# Patient Record
Sex: Female | Born: 1995
Health system: Southern US, Community
[De-identification: ages and names within clinical notes are randomized; demographics above are authoritative.]

## PROBLEM LIST (undated history)

## (undated) DIAGNOSIS — Q210 Ventricular septal defect: Secondary | ICD-10-CM

## (undated) DIAGNOSIS — G43909 Migraine, unspecified, not intractable, without status migrainosus: Secondary | ICD-10-CM

## (undated) DIAGNOSIS — E109 Type 1 diabetes mellitus without complications: Secondary | ICD-10-CM

## (undated) DIAGNOSIS — E11649 Type 2 diabetes mellitus with hypoglycemia without coma: Secondary | ICD-10-CM

## (undated) DIAGNOSIS — R6250 Unspecified lack of expected normal physiological development in childhood: Secondary | ICD-10-CM

## (undated) DIAGNOSIS — E049 Nontoxic goiter, unspecified: Secondary | ICD-10-CM

## (undated) DIAGNOSIS — T7840XA Allergy, unspecified, initial encounter: Secondary | ICD-10-CM

## (undated) HISTORY — PX: FRENULECTOMY, LINGUAL: SHX1681

## (undated) HISTORY — DX: Ventricular septal defect: Q21.0

## (undated) HISTORY — DX: Migraine, unspecified, not intractable, without status migrainosus: G43.909

## (undated) HISTORY — DX: Nontoxic goiter, unspecified: E04.9

## (undated) HISTORY — DX: Unspecified lack of expected normal physiological development in childhood: R62.50

## (undated) HISTORY — PX: OTHER SURGICAL HISTORY: SHX169

## (undated) HISTORY — DX: Allergy, unspecified, initial encounter: T78.40XA

## (undated) HISTORY — DX: Type 2 diabetes mellitus with hypoglycemia without coma: E11.649

## (undated) HISTORY — DX: Type 1 diabetes mellitus without complications: E10.9

---
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
## 2015-01-10 ENCOUNTER — Encounter: Payer: Self-pay | Admitting: *Deleted

## 2015-01-10 NOTE — Progress Notes (Signed)
On 12/17/14 pt was given 1 sample humalog pens, lot# [id number], exp 10/17 and 1 pen lot# [id number], exp 12/17

## 2015-02-10 ENCOUNTER — Encounter: Payer: Self-pay | Admitting: "Endocrinology

## 2015-02-10 ENCOUNTER — Ambulatory Visit: Payer: 59 | Admitting: Pediatric Endocrinology

## 2015-02-10 ENCOUNTER — Ambulatory Visit (INDEPENDENT_AMBULATORY_CARE_PROVIDER_SITE_OTHER): Payer: 59 | Admitting: "Endocrinology

## 2015-02-10 VITALS — BP 119/78 | HR 106 | Wt 134.6 lb

## 2015-02-10 DIAGNOSIS — E1065 Type 1 diabetes mellitus with hyperglycemia: Secondary | ICD-10-CM

## 2015-02-10 DIAGNOSIS — I471 Supraventricular tachycardia: Secondary | ICD-10-CM

## 2015-02-10 DIAGNOSIS — I4711 Inappropriate sinus tachycardia, so stated: Secondary | ICD-10-CM

## 2015-02-10 DIAGNOSIS — E10649 Type 1 diabetes mellitus with hypoglycemia without coma: Secondary | ICD-10-CM

## 2015-02-10 DIAGNOSIS — E1043 Type 1 diabetes mellitus with diabetic autonomic (poly)neuropathy: Secondary | ICD-10-CM

## 2015-02-10 DIAGNOSIS — R635 Abnormal weight gain: Secondary | ICD-10-CM

## 2015-02-10 DIAGNOSIS — IMO0002 Reserved for concepts with insufficient information to code with codable children: Secondary | ICD-10-CM

## 2015-02-10 DIAGNOSIS — R000 Tachycardia, unspecified: Secondary | ICD-10-CM

## 2015-02-10 DIAGNOSIS — E049 Nontoxic goiter, unspecified: Secondary | ICD-10-CM

## 2015-02-10 LAB — GLUCOSE, POCT (MANUAL RESULT ENTRY): POC Glucose: 334 mg/dl — AB (ref 70–99)

## 2015-02-10 LAB — POCT GLYCOSYLATED HEMOGLOBIN (HGB A1C): HEMOGLOBIN A1C: 8.5

## 2015-02-10 NOTE — Progress Notes (Signed)
Subjective:  Patient Name: Paula Miller Date of Birth: 1996/10/12  MRN: [id number]  Immaculate Crutcher  presents to the office today for follow-up evaluation and management of her type 1 diabetes on insulin pump, hypoglycemia, hypoglycemic unawareness, autonomic neuropathy, inappropriate sinus tachycardia, and goiter  HISTORY OF PRESENT ILLNESS:   Paula Miller is a 19 y.o. Caucasian young lady.  [name] was unaccompanied.  1. Paula Miller was diagnosed with new onset type 1 diabetes mellitus, dehydration, and ketonuria on 06/09/2005. She was initially treated with a multiple daily injection regimen of Lantus and Novolog insulins.  We converted her to a Medtronic insulin pump in November of 2007.  Although the patient does have a palpable goiter, she has remained euthyroid. Her CMPs, lipid panels, and urinary microalbumin to creatinine ratios have always been normal.   2. The patient's last [facility] visit was on 12/17/14. In the interim, she has been generally healthy. She feels less embarrassed to take care of her diabetes in front of close friends. She is also more willing to check BGs and take her insulin boluses around people who are not close friends. She has her own private room now, but shares a bath and common room with a roommate. She is still somewhat tired, but is better. She is still concerned about weight gain and has been trying to lose weight. She denies intentionally missing insulin for weight loss.   3. Pertinent Review of Systems:  Constitutional: The patient feels "pretty good". She is generally healthy.  Eyes: Vision is good with her new glasses. Her most recent eye exam was December 2015. There were no signs of diabetic eye disease. There are no other recognized eye problems.  Neck: The patient has no complaints of anterior neck swelling, soreness, tenderness, pressure, discomfort, or difficulty swallowing.   Heart: Heart rate increases with exercise or other physical activity. The patient  has no complaints of palpitations, irregular heart beats, chest pain, or chest pressure.   Gastrointestinal: Bowel movents seem normal. The patient has no complaints of excessive hunger, acid reflux, upset stomach, stomach aches or pains, diarrhea, or constipation.  Legs: Muscle mass and strength seem normal. There are no complaints of numbness, tingling, burning, or pain. No edema is noted.  Feet: There are no obvious foot problems. There are no complaints of numbness, tingling, burning, or pain. No edema is noted. Neurologic: There are no recognized problems with muscle movement and strength, sensation, or coordination. GYN: MP 3 weeks ago. Periods are regular Hypoglycemia: rare- lowest 52  Diabetes ID: She owns ID, but is not wearing it.    4. BG printout: She changes sites every 3-5 days. She checks BGs an average of 4.8 times per day, compared to 3.9 times daily at her last visit. She occasionally misses a bedtime BG check. Average BG was 204, compared with 233 at last visit. She boluses from 0-5 times per day. She can sometimes go for up to 48 hours without bol using. Many of her lower BGs that occur in the afternoons are due to exercising. She has had 18 BGs < 80, to include a 50 and a 51. She has had 11 BGs > 400.    PAST MEDICAL, FAMILY, AND SOCIAL HISTORY  Past Medical History  Diagnosis Date  . Type 1 diabetes mellitus in patient age 81-19 years with HbA1C goal below 7.5   . Hypoglycemia associated with diabetes   . Goiter   . Physical growth delay     Family History  Problem Relation  Age of Onset  . Diabetes Mother   . Thyroid disease Maternal Grandmother      Current outpatient prescriptions:  .  APIDRA 100 UNIT/ML injection, Inject subcutaneously - use with insulin pump, Disp: 100 mL, Rfl: 3 .  doxycycline (ADOXA) 100 MG tablet, Take 100 mg by mouth 2 (two) times daily., Disp: , Rfl:  .  glucagon 1 MG injection, Use for Severe Hypoglycemia . Inject 42m intramuscularly if  unresponsive, unable to swallow, unconscious and/or has seizure, Disp: 2 kit, Rfl: 6 .  glucose blood (BAYER CONTOUR NEXT TEST) test strip, Checks blood sugar 10x daily, Disp: 900 each, Rfl: 4 .  Multiple Vitamin (MULTIVITAMIN) tablet, Take 1 tablet by mouth daily.  , Disp: , Rfl:  .  insulin aspart (NOVOLOG FLEXPEN) 100 UNIT/ML FlexPen, Use up to 50 units daily (Patient not taking: Reported on 02/10/2015), Disp: 5 pen, Rfl: 6 .  insulin glargine (LANTUS) 100 UNIT/ML injection, Use per protocol up to 50 units., Disp: 15 mL, Rfl: 3 .  Insulin Pen Needle 31G X 5 MM MISC, Check Blood glucose 10x day (Patient not taking: Reported on 02/10/2015), Disp: 900 each, Rfl: 3  Allergies as of 02/10/2015  . (No Known Allergies)     reports that she has never smoked. She has never used smokeless tobacco. She reports that she does not drink alcohol or use illicit drugs. Pediatric History  Patient Guardian Status  . Father:  TJazmene, Racz  Other Topics Concern  . 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 She plans to major in psychology and premed Activities: She tried to start running, but developed some knee pains. She walks a lot around the campus. Denies any alcohol use.  Primary Care Provider: DMaurine Cane MD  REVIEW OF SYSTEMS: There are no other significant problems involving Charlott's other body systems.   Objective:  Vital Signs:  BP 119/78 mmHg  Pulse 106  Wt 134 lb 9.6 oz (61.054 kg)    Ht Readings from Last 3 Encounters:  09/03/14 5' 3.94" (1.624 m) (46 %*, Z = -0.11)  05/02/14 5' 3.94" (1.624 m) (46 %*, Z = -0.10)  11/12/13 5' 3.78" (1.62 m) (44 %*, Z = -0.15)   * Growth percentiles are based on CDC 2-20 Years data.   Wt Readings from Last 3 Encounters:  02/10/15 134 lb 9.6 oz (61.054 kg) (67 %*, Z = 0.43)  12/17/14 130 lb 12.8 oz (59.33 kg) (62 %*, Z = 0.29)  09/03/14 132 lb (59.875 kg) (65 %*, Z =  0.38)   * Growth percentiles are based on CDC 2-20 Years data.   HC Readings from Last 3 Encounters:  [facility]  There is no height on file to calculate BSA. No height on file for this encounter. 67%ile (Z=0.43) based on CDC 2-20 Years weight-for-age data using vitals from 02/10/2015.  PHYSICAL EXAM: Constitutional: The patient appears healthy. She has gained about 3-3/4 pounds since her last visit. The patient's height and weight are normal for age.  Head: The head is normocephalic. Face: The face appears normal. There are no obvious dysmorphic features. Eyes: The eyes appear to be normally formed and spaced. Gaze is conjugate. There is no obvious arcus or proptosis. Moisture appears normal. Mouth: The oropharynx appears normal. Her geographic tongue has resolved. Oral moisture is normal. Neck: The neck appears to be visibly normal. The thyroid  gland is smaller at about 22-23 grams in size. Both lobes are mildly enlarged. The consistency is normal. The thyroid gland is not tender to palpation. Lungs: The lungs are clear to auscultation. Air movement is good. Heart: Heart rate and rhythm are regular. Heart sounds S1 and S2 are normal. I did not appreciate any pathologic cardiac murmurs. Abdomen: The abdomen is normal in size for the patient's age. Bowel sounds are normal. There is no obvious hepatomegaly, splenomegaly, or other mass effect.  Arms: Muscle size and bulk are normal for age. Hands: There is no obvious tremor. Phalangeal and metacarpophalangeal joints are normal. Palmar muscles are normal for age. Palmar skin is normal. Palmar moisture is also normal. Legs: Muscles appear normal for age. No edema is present. Feet: Feet are normally formed. Dorsalis pedal pulses are normal 1+ bilaterally. Neurologic: Strength is normal for age in both the upper and lower extremities. Muscle tone is normal. Sensation to touch is normal in both the legs and feet. She is extremely  ticklish.   LAB DATA:   Results for orders placed or performed in visit on 02/10/15 (from the past 504 hour(s))  POCT Glucose (CBG)   Collection Time: 02/10/15  2:08 PM  Result Value Ref Range   POC Glucose 334 (A) 70 - 99 mg/dl  POCT HgB A1C   Collection Time: 02/10/15  2:12 PM  Result Value Ref Range   Hemoglobin A1C 8.5    Office Visit on 12/17/2014  Component Date Value Ref Range Status  . POC Glucose 12/17/2014 355* 70 - 99 mg/dl Final   12:45 PB sandwich, chips and water   Labs 11/13/14: HbA1c 9.1%; CMP normal except for a glucose of 218; cholesterol 150, triglycerides 109, HDL 72, LDL 56; urinary microalbumin/creatinine ratio 2.7; TSH 1.634, free T4 1.12, free T3 4.2   Assessment and Plan:   ASSESSMENT:  1. Type 1 diabetes: She has often been leaving sites in too long after they have already started to go bad. She has also been missing many boluses.  2. Hypoglycemia: Her low BGs have become more frequent, in part due to not always checking BGs before bolusing, in part due to trying to consume fewer carbs, and in apart due to increasing exercise.  3. Goiter: Her thyroid gland is actually smaller today. The waxing and waning of thyroid gland size is c/w evolving Hashimoto's thyroiditis. She was euthyroid again last December.  4. Weight increased: She is about 12% above her Ideal Body Weight.  5. Autonomic neuropathy and inappropriate sinus tachycardia: These problems will reverse with better BG control.  PLAN:  1. Diagnostic: HbA1c as above. Annual labs as above 2. Therapeutic: No changes to pump settings. She needs to change sites more frequently and bolus more consistently. If she exercises she needs to set a lower temporary basal rate..   Basal rates:  MN 0.60 4 0.60 630 1.10 10 1.85 2 PM 1.75 7 PM 1.20 9 PM 0.90  Bolus settings:   Carb   ISF   Targets MN 40  100   150 6   5    50   110 11 12 5  pm 18  100 11 PM      150   3. Patient education: Reviewed  pump download. Reviewed the Pump hyperglycemia protocol. Discussed need to change sites regularly, check BGs regularly, and to bolus regularly.  4. Follow-up: 3 months  Level of Service: This visit lasted in excess of 40 minutes. More than 50%  of the visit was devoted to counseling.    Sherrlyn Hock, MD

## 2015-02-10 NOTE — Patient Instructions (Signed)
Follow up visit in 3 months. 

## 2015-05-21 ENCOUNTER — Encounter: Payer: Self-pay | Admitting: Pediatric Endocrinology

## 2015-05-21 ENCOUNTER — Ambulatory Visit (INDEPENDENT_AMBULATORY_CARE_PROVIDER_SITE_OTHER): Payer: 59 | Admitting: Pediatric Endocrinology

## 2015-05-21 VITALS — BP 112/73 | HR 112 | Wt 131.0 lb

## 2015-05-21 DIAGNOSIS — E10649 Type 1 diabetes mellitus with hypoglycemia without coma: Secondary | ICD-10-CM | POA: Diagnosis not present

## 2015-05-21 DIAGNOSIS — E1065 Type 1 diabetes mellitus with hyperglycemia: Secondary | ICD-10-CM

## 2015-05-21 DIAGNOSIS — I471 Supraventricular tachycardia: Secondary | ICD-10-CM

## 2015-05-21 DIAGNOSIS — E1043 Type 1 diabetes mellitus with diabetic autonomic (poly)neuropathy: Secondary | ICD-10-CM

## 2015-05-21 DIAGNOSIS — Z4681 Encounter for fitting and adjustment of insulin pump: Secondary | ICD-10-CM

## 2015-05-21 DIAGNOSIS — E11649 Type 2 diabetes mellitus with hypoglycemia without coma: Secondary | ICD-10-CM | POA: Diagnosis not present

## 2015-05-21 DIAGNOSIS — I4711 Inappropriate sinus tachycardia, so stated: Secondary | ICD-10-CM

## 2015-05-21 DIAGNOSIS — R000 Tachycardia, unspecified: Secondary | ICD-10-CM

## 2015-05-21 DIAGNOSIS — IMO0002 Reserved for concepts with insufficient information to code with codable children: Secondary | ICD-10-CM

## 2015-05-21 LAB — POCT GLYCOSYLATED HEMOGLOBIN (HGB A1C): HEMOGLOBIN A1C: 8.7

## 2015-05-21 LAB — GLUCOSE, POCT (MANUAL RESULT ENTRY): POC Glucose: 130 mg/dl — AB (ref 70–99)

## 2015-05-21 NOTE — Progress Notes (Signed)
Subjective:  Patient Name: Paula Miller Date of Birth: Oct 16, 1996  MRN: [id number]  Paula Miller  presents to the office today for follow-up evaluation and management of her type 1 diabetes on insulin pump, hypoglycemia, hypoglycemic unawareness, autonomic neuropathy, inappropriate sinus tachycardia, and goiter  HISTORY OF PRESENT ILLNESS:   Paula Miller is a 19 y.o. Caucasian young lady.  Paula Miller was unaccompanied.  1. Paula Miller was diagnosed with new onset type 1 diabetes mellitus, dehydration, and ketonuria on 06/09/2005. She was initially treated with a multiple daily injection regimen of Lantus and Novolog insulins.  We converted her to a Medtronic insulin pump in November of 2007.  Although the patient does have a palpable goiter, she has remained euthyroid. Her CMPs, lipid panels, and urinary microalbumin to creatinine ratios have always been normal.   2. The patient's last [facility] visit was on 02/10/15. In the interim, she has been generally healthy. She has changed her diet to be mostly plant based. With her new diet she is rarely bolusing for carbs or blood sugar (even if her sugar is high) as she is trending down on her basal and is often low later in the day. She has made some adjustments to her basal to lower it but not enough to stop getting low. She is not wearing the CGM component of her pump because she feels that it does not work for her to have 2 sites. She is also exercising more. She has not been using the temp basl or removing her pump during exercise as she has not felt that she has needed to. She usually can tell when she is getting low- although she has had some values in the 40s. She usually does not get low overnight. She is eating in the dining hall when she is at school. This summer she is living at home which has been somewhat stressful but overall good.   3. Pertinent Review of Systems:  Constitutional: The patient feels "pretty good". She is generally healthy.  Eyes:  Vision is good with her glasses. Her most recent eye exam was December 2015. There were no signs of diabetic eye disease. There are no other recognized eye problems.  Neck: The patient has no complaints of anterior neck swelling, soreness, tenderness, pressure, discomfort, or difficulty swallowing.   Heart: Heart rate increases with exercise or other physical activity. The patient has no complaints of palpitations, irregular heart beats, chest pain, or chest pressure.   Gastrointestinal: Bowel movents seem normal. The patient has no complaints of excessive hunger, acid reflux, upset stomach, stomach aches or pains, diarrhea, or constipation.  Legs: Muscle mass and strength seem normal. There are no complaints of numbness, tingling, burning, or pain. No edema is noted.  Feet: There are no obvious foot problems. There are no complaints of numbness, tingling, burning, or pain. No edema is noted. Neurologic: There are no recognized problems with muscle movement and strength, sensation, or coordination. GYN: MP 3 weeks ago. Periods are regular  Hypoglycemia: rare- lowest 48. More often later in the day.   Diabetes ID: Wearing bracelet.   4. BG printout: Changing sites every 4-6 days. Checking sugar 2.8 times per day. Avg BG 197 +/- 104. She has not been bolusing for most carbs or blood sugars. She is getting 93% of her insulin from basal. TDI is ~30 units/day.   Last visit: She changes sites every 3-5 days. She checks BGs an average of 4.8 times per day, compared to 3.9 times daily at her last  visit. She occasionally misses a bedtime BG check. Average BG was 204, compared with 233 at last visit. She boluses from 0-5 times per day. She can sometimes go for up to 48 hours without bol using. Many of her lower BGs that occur in the afternoons are due to exercising. She has had 18 BGs < 80, to include a 50 and a 51. She has had 11 BGs > 400.  Not wearing CGM.    PAST MEDICAL, FAMILY, AND SOCIAL HISTORY  Past  Medical History  Diagnosis Date  . Type 1 diabetes mellitus in patient age 62-19 years with HbA1C goal below 7.5   . Hypoglycemia associated with diabetes   . Goiter   . Physical growth delay     Family History  Problem Relation Age of Onset  . Diabetes Mother   . Thyroid disease Maternal Grandmother      Current outpatient prescriptions:  .  APIDRA 100 UNIT/ML injection, Inject subcutaneously - use with insulin pump, Disp: 100 mL, Rfl: 3 .  doxycycline (ADOXA) 100 MG tablet, Take 100 mg by mouth 2 (two) times daily., Disp: , Rfl:  .  glucagon 1 MG injection, Use for Severe Hypoglycemia . Inject 29m intramuscularly if unresponsive, unable to swallow, unconscious and/or has seizure, Disp: 2 kit, Rfl: 6 .  glucose blood (BAYER CONTOUR NEXT TEST) test strip, Checks blood sugar 10x daily, Disp: 900 each, Rfl: 4 .  Insulin Pen Needle 31G X 5 MM MISC, Check Blood glucose 10x day, Disp: 900 each, Rfl: 3 .  Lorcaserin HCl (BELVIQ PO), Take by mouth., Disp: , Rfl:  .  Multiple Vitamin (MULTIVITAMIN) tablet, Take 1 tablet by mouth daily.  , Disp: , Rfl:  .  insulin aspart (NOVOLOG FLEXPEN) 100 UNIT/ML FlexPen, Use up to 50 units daily (Patient not taking: Reported on 02/10/2015), Disp: 5 pen, Rfl: 6 .  insulin glargine (LANTUS) 100 UNIT/ML injection, Use per protocol up to 50 units., Disp: 15 mL, Rfl: 3  Allergies as of 05/21/2015  . (No Known Allergies)     reports that she has never smoked. She has never used smokeless tobacco. She reports that she does not drink alcohol or use illicit drugs. Pediatric History  Patient Guardian Status  . Father:  TDarien, Mignogna  Other Topics Concern  . 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  Activities: Running and exercising regularly. Denies any alcohol use.  Primary Care Provider: DMaurine Cane MD  REVIEW OF SYSTEMS: There are no other significant problems involving  Nabiha's other body systems.   Objective:  Vital Signs:  BP 112/73 mmHg  Pulse 112  Wt 131 lb (59.421 kg)    Ht Readings from Last 3 Encounters:  09/03/14 5' 3.94" (1.624 m) (46 %*, Z = -0.11)  05/02/14 5' 3.94" (1.624 m) (46 %*, Z = -0.10)  11/12/13 5' 3.78" (1.62 m) (44 %*, Z = -0.15)   * Growth percentiles are based on CDC 2-20 Years data.   Wt Readings from Last 3 Encounters:  05/21/15 131 lb (59.421 kg) (60 %*, Z = 0.25)  02/10/15 134 lb 9.6 oz (61.054 kg) (67 %*, Z = 0.43)  12/17/14 130 lb 12.8 oz (59.33 kg) (62 %*, Z = 0.29)   * Growth percentiles are based on CDC 2-20 Years data.   HC Readings from Last 3 Encounters:  [facility]  There is no  height on file to calculate BSA. No height on file for this encounter. 60%ile (Z=0.25) based on CDC 2-20 Years weight-for-age data using vitals from 05/21/2015.  PHYSICAL EXAM:  Constitutional: The patient appears healthy.  The patient's height and weight are normal for age.  Head: The head is normocephalic. Face: The face appears normal. There are no obvious dysmorphic features. Eyes: The eyes appear to be normally formed and spaced. Gaze is conjugate. There is no obvious arcus or proptosis. Moisture appears normal. Mouth: The oropharynx appears normal. Her geographic tongue has resolved. Oral moisture is normal. Neck: The neck appears to be visibly normal. The thyroid gland is normal for age. The consistency is normal. The thyroid gland is not tender to palpation. Lungs: The lungs are clear to auscultation. Air movement is good. Heart: Heart rate and rhythm are regular. Heart sounds S1 and S2 are normal. I did not appreciate any pathologic cardiac murmurs. Abdomen: The abdomen is normal in size for the patient's age. Bowel sounds are normal. There is no obvious hepatomegaly, splenomegaly, or other mass effect.  Arms: Muscle size and bulk are normal for age. Hands: There is no obvious tremor. Phalangeal and  metacarpophalangeal joints are normal. Palmar muscles are normal for age. Palmar skin is normal. Palmar moisture is also normal. Legs: Muscles appear normal for age. No edema is present. Feet: Feet are normally formed. Dorsalis pedal pulses are normal 1+ bilaterally. Neurologic: Strength is normal for age in both the upper and lower extremities. Muscle tone is normal. Sensation to touch is normal in both the legs and feet. She is extremely ticklish.   LAB DATA:   Results for orders placed or performed in visit on 05/21/15 (from the past 504 hour(s))  POCT Glucose (CBG)   Collection Time: 05/21/15  1:54 PM  Result Value Ref Range   POC Glucose 130 (A) 70 - 99 mg/dl  POCT HgB A1C   Collection Time: 05/21/15  2:10 PM  Result Value Ref Range   Hemoglobin A1C 8.7     Labs 11/13/14: HbA1c 9.1%; CMP normal except for a glucose of 218; cholesterol 150, triglycerides 109, HDL 72, LDL 56; urinary microalbumin/creatinine ratio 2.7; TSH 1.634, free T4 1.12, free T3 4.2   Assessment and Plan:   ASSESSMENT:  1. Type 1 diabetes: She is getting hypoglycemic most days despite not bolusing for carbs. She is almost exclusively riding on her basal which is still resulting in frequent hypoglycemia. She then over corrects her hypoglycemia resulting in overnight/morning hyperglycemia.  2. Hypoglycemia: Her low BGs have become more frequent - mostly later in the day 3. Weight- overall stable. Has lost some weight since last visit but is approx same weight as her January visit.  4. Autonomic neuropathy and inappropriate sinus tachycardia: These problems will reverse with better BG control.  PLAN:  1. Diagnostic: HbA1c as above. Annual labs due in December.  2. Therapeutic:  We have reset her pump to adjust for her changes in diet and lifestyle. She needs significant less basal and needs to be able to bolus appropriately when she eats to cover carbs (rather than have carbs cover her basal).   Basal- Total  26.8 units -> 15 units  MN 0.5  4a 0.75 8 0.625  Carb ratio MN 10 6a 8 11a  10  3. Patient education: Reviewed pump download and titrated insulin doses. Reviewed the Pump hyperglycemia protocol. Discussed need to change sites regularly, check BGs regularly, and to bolus regularly. Gavyn asked appropriate  questions and was satisfied with discussion and plan today. She is to call/email/text next week to let me know how new settings are working.   4. Follow-up: Return in about 4 months (around 09/20/2015).   Level of Service: This visit lasted in excess of 40 minutes. More than 50% of the visit was devoted to counseling.    Darrold Span, MD

## 2015-05-21 NOTE — Patient Instructions (Signed)
We made changes to your pump settings to reflect your current lifestyle choices/changes. We have reduced your basal significantly - which means that you will need to bolus! Check your sugar at least 4 times a day!  Basal- Total 26.8 units -> 15 units  MN 0.5  4a 0.75 8 0.625  Carb ratio MN 10 6a 8 11a  10  You can call Lorena during the day, email me [email or web address] or text me at [phone]. OR you can call Wed/Sun evenings [phone]. Let me know how the new settings are working - they may need some tinkering.

## 2015-07-30 ENCOUNTER — Other Ambulatory Visit: Payer: Self-pay | Admitting: *Deleted

## 2015-07-30 DIAGNOSIS — E1065 Type 1 diabetes mellitus with hyperglycemia: Secondary | ICD-10-CM

## 2015-07-30 DIAGNOSIS — IMO0002 Reserved for concepts with insufficient information to code with codable children: Secondary | ICD-10-CM

## 2015-07-30 MED ORDER — INSULIN GLULISINE 100 UNIT/ML IJ SOLN: INTRAMUSCULAR | Status: DC

## 2015-09-09 ENCOUNTER — Ambulatory Visit: Payer: 59 | Admitting: Pediatric Endocrinology

## 2015-10-13 ENCOUNTER — Ambulatory Visit: Payer: 59 | Admitting: Pediatrics

## 2015-10-14 ENCOUNTER — Encounter: Payer: Self-pay | Admitting: Pediatrics

## 2015-10-14 ENCOUNTER — Ambulatory Visit (INDEPENDENT_AMBULATORY_CARE_PROVIDER_SITE_OTHER): Payer: 59 | Admitting: Pediatrics

## 2015-10-14 VITALS — BP 121/81 | HR 113 | Wt 136.9 lb

## 2015-10-14 DIAGNOSIS — Z4681 Encounter for fitting and adjustment of insulin pump: Secondary | ICD-10-CM | POA: Diagnosis not present

## 2015-10-14 DIAGNOSIS — E1065 Type 1 diabetes mellitus with hyperglycemia: Secondary | ICD-10-CM

## 2015-10-14 DIAGNOSIS — E109 Type 1 diabetes mellitus without complications: Secondary | ICD-10-CM | POA: Diagnosis not present

## 2015-10-14 DIAGNOSIS — IMO0001 Reserved for inherently not codable concepts without codable children: Secondary | ICD-10-CM

## 2015-10-14 DIAGNOSIS — K141 Geographic tongue: Secondary | ICD-10-CM

## 2015-10-14 DIAGNOSIS — R000 Tachycardia, unspecified: Secondary | ICD-10-CM

## 2015-10-14 DIAGNOSIS — E1043 Type 1 diabetes mellitus with diabetic autonomic (poly)neuropathy: Secondary | ICD-10-CM

## 2015-10-14 LAB — POCT GLYCOSYLATED HEMOGLOBIN (HGB A1C): Hemoglobin A1C: 12.9

## 2015-10-14 LAB — GLUCOSE, POCT (MANUAL RESULT ENTRY): POC Glucose: 427 mg/dl — AB (ref 70–99)

## 2015-10-14 MED ORDER — INSULIN ASPART 100 UNIT/ML FLEXPEN: PEN_INJECTOR | SUBCUTANEOUS | Status: DC

## 2015-10-14 MED ORDER — GLUCAGON (RDNA) 1 MG IJ KIT: PACK | INTRAMUSCULAR | Status: DC

## 2015-10-14 MED ORDER — INSULIN LISPRO 100 UNIT/ML (KWIKPEN): PEN_INJECTOR | SUBCUTANEOUS | Status: DC

## 2015-10-14 MED ORDER — INSULIN GLARGINE 100 UNIT/ML ~~LOC~~ SOLN: SUBCUTANEOUS | Status: DC

## 2015-10-14 NOTE — Progress Notes (Signed)
Subjective:  Patient Name: Paula Miller Date of Birth: 1996/07/28  MRN: [id number]  Paula Miller  presents to the office today for follow-up evaluation and management of her type 1 diabetes on insulin pump, hypoglycemia, hypoglycemic unawareness, autonomic neuropathy, inappropriate sinus tachycardia, and goiter  HISTORY OF PRESENT ILLNESS:   Paula Miller is a 19 y.o. Caucasian young lady.  Paula Miller was unaccompanied.  1. Paula Miller was diagnosed with new onset type 1 diabetes mellitus, dehydration, and ketonuria on 06/09/2005. She was initially treated with a multiple daily injection regimen of Lantus and Novolog insulins.  We converted her to a Medtronic insulin pump in November of 2007.  Although the patient does have a palpable goiter, she has remained euthyroid. Her CMPs, lipid panels, and urinary microalbumin to creatinine ratios have always been normal.   2. The patient's last PSSG visit was on 05/21/15. In the interim, she has been generally healthy.   She has had a lot more stress with school. This has resulted in her not eating as well and not attending to her diabetes care like she knows she should be. She is a very private person and finds it hard to check when she is very busy on campus all day because she prefers to check somewhere like a bathroom. She has made significant changes to her basal rates over time without calling for advice. She does get low at times in the middle of the night but just feels them and eats something instead of checking to see what the low actually is.   She feels like her friends are a good source of support at school. They know she has diabetes and help "take care" of her. She enjoys spending time with them as a distraction from school stress. She is typically up until at least midnight. She finds her mom to still be a source of stress. She is always nagging her about her DM care.   3. Pertinent Review of Systems:  Constitutional: The patient feels "pretty  good". She is generally healthy. Has had headaches frequently.  Eyes: Vision is good with her glasses. Her most recent eye exam was December 2015. There were no signs of diabetic eye disease. There are no other recognized eye problems.  Neck: The patient has no complaints of anterior neck swelling, soreness, tenderness, pressure, discomfort, or difficulty swallowing.   Heart: Heart rate increases with exercise or other physical activity. The patient has no complaints of palpitations, irregular heart beats, chest pain, or chest pressure.   Gastrointestinal: Bowel movents seem normal. The patient has no complaints of excessive hunger, acid reflux, upset stomach, stomach aches or pains, diarrhea, or constipation.  Legs: Muscle mass and strength seem normal. There are no complaints of numbness, tingling, burning, or pain. No edema is noted.  Feet: There are no obvious foot problems. There are no complaints of numbness, tingling, burning, or pain. No edema is noted. Neurologic: There are no recognized problems with muscle movement and strength, sensation, or coordination. GYN: MP 3 weeks ago. Periods are regular  Hypoglycemia: rare- not recorded on her meter at all.   [email or web address]  Diabetes ID: Wearing bracelet. Annual labs: Due December 2016   4. BG printout: Changing sites every 4-5 days. Checking avg 1.1 times per day. Missing 9 days of checks. Avg BG 344 +/- 125. Has not been bolusing for most glucoses and few carbs. 83% basal. TDI is ~ 45 units/day.  Last visit: Changing sites every 4-6 days. Checking sugar 2.8 times per day. Avg  BG 197 +/- 104. She has not been bolusing for most carbs or blood sugars. She is getting 93% of her insulin from basal. TDI is ~30 units/day.     PAST MEDICAL, FAMILY, AND SOCIAL HISTORY  Past Medical History  Diagnosis Date  . Type 1 diabetes mellitus in patient age 30-19 years with HbA1C goal below 7.5   . Hypoglycemia associated with diabetes   . Goiter    . Physical growth delay     Family History  Problem Relation Age of Onset  . Diabetes Mother   . Thyroid disease Maternal Grandmother      Current outpatient prescriptions:  .  doxycycline (ADOXA) 100 MG tablet, Take 100 mg by mouth 2 (two) times daily., Disp: , Rfl:  .  glucagon 1 MG injection, Use for Severe Hypoglycemia . Inject 7m intramuscularly if unresponsive, unable to swallow, unconscious and/or has seizure, Disp: 2 kit, Rfl: 6 .  glucose blood (BAYER CONTOUR NEXT TEST) test strip, Checks blood sugar 10x daily, Disp: 900 each, Rfl: 4 .  insulin glulisine (APIDRA) 100 UNIT/ML injection, Use 300 units in insulin pump every 48 hours, Disp: 20 vial, Rfl: 3 .  Multiple Vitamin (MULTIVITAMIN) tablet, Take 1 tablet by mouth daily.  , Disp: , Rfl:  .  insulin aspart (NOVOLOG FLEXPEN) 100 UNIT/ML FlexPen, Use up to 50 units daily (Patient not taking: Reported on 02/10/2015), Disp: 5 pen, Rfl: 6 .  Insulin Pen Needle 31G X 5 MM MISC, Check Blood glucose 10x day, Disp: 900 each, Rfl: 3  Allergies as of 10/14/2015  . (No Known Allergies)     reports that she has never smoked. She has never used smokeless tobacco. She reports that she does not drink alcohol or use illicit drugs. Pediatric History  Patient Guardian Status  . Father:  TAylanie, Paula Miller  Other Topics Concern  . 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  Activities: Running and exercising regularly. Denies any alcohol use.  Primary Care Provider: DMaurine Cane MD  REVIEW OF SYSTEMS: There are no other significant problems involving Paula Miller other body systems.   Objective:  Vital Signs:  BP 121/81 mmHg  Pulse 113  Wt 136 lb 14.4 oz (62.097 kg)    Ht Readings from Last 3 Encounters:  09/03/14 5' 3.94" (1.624 m) (46 %*, Z = -0.11)  05/02/14 5' 3.94" (1.624 m) (46 %*, Z = -0.10)  11/12/13 5' 3.78" (1.62 m) (44 %*, Z = -0.15)   *  Growth percentiles are based on CDC 2-20 Years data.   Wt Readings from Last 3 Encounters:  10/14/15 136 lb 14.4 oz (62.097 kg) (67 %*, Z = 0.45)  05/21/15 131 lb (59.421 kg) (60 %*, Z = 0.25)  02/10/15 134 lb 9.6 oz (61.054 kg) (67 %*, Z = 0.43)   * Growth percentiles are based on CDC 2-20 Years data.   HC Readings from Last 3 Encounters:  [facility]  There is no height on file to calculate BSA. No height on file for this encounter. 67%ile (Z=0.45) based on CDC 2-20 Years weight-for-age data using vitals from 10/14/2015.  PHYSICAL EXAM:  Constitutional: The patient appears healthy.  The patient's height and weight are normal for age.  Head: The head is normocephalic. Face: The face appears normal. There are no obvious dysmorphic features. Eyes: The eyes appear to be normally formed and spaced. Gaze is  conjugate. There is no obvious arcus or proptosis. Moisture appears normal. Mouth: The oropharynx appears normal. Her geographic tongue has returned. Oral moisture is normal. Neck: The neck appears to be visibly normal. The thyroid gland is normal for age. The consistency is normal. The thyroid gland is not tender to palpation. Lungs: The lungs are clear to auscultation. Air movement is good. Heart: Heart rate and rhythm are regular. Heart sounds S1 and S2 are normal. I did not appreciate any pathologic cardiac murmurs. Abdomen: The abdomen is normal in size for the patient's age. Bowel sounds are normal. There is no obvious hepatomegaly, splenomegaly, or other mass effect.  Arms: Muscle size and bulk are normal for age. Hands: There is no obvious tremor. Phalangeal and metacarpophalangeal joints are normal. Palmar muscles are normal for age. Palmar skin is normal. Palmar moisture is also normal. Legs: Muscles appear normal for age. No edema is present. Feet: Feet are normally formed. Dorsalis pedal pulses are normal 1+ bilaterally. Neurologic: Strength is normal for age in  both the upper and lower extremities. Muscle tone is normal. Sensation to touch is normal in both the legs and feet.    LAB DATA:   Results for orders placed or performed in visit on 10/14/15 (from the past 504 hour(s))  POCT Glucose (CBG)   Collection Time: 10/14/15 10:45 AM  Result Value Ref Range   POC Glucose 427 (A) 70 - 99 mg/dl  POCT HgB A1C   Collection Time: 10/14/15 10:53 AM  Result Value Ref Range   Hemoglobin A1C 12.9     Labs 11/13/14: HbA1c 9.1%; CMP normal except for a glucose of 218; cholesterol 150, triglycerides 109, HDL 72, LDL 56; urinary microalbumin/creatinine ratio 2.7; TSH 1.634, free T4 1.12, free T3 4.2   Assessment and Plan:   ASSESSMENT:  1. Type 1 diabetes: Cares have deteriorated significantly this semester. She has made significant changes to basal rates with no other changes. Based on number of checks a day it is very hard to make changes today but it is necessary to start somewhere. We will go back to some things that were similar to last March. See below.  2. Hypoglycemia: Her low BGs have become more frequent - mostly later in the day 3. Weight- overall stable. Has lost some weight since last visit but is approx same weight as her January visit.  4. Autonomic neuropathy and inappropriate sinus tachycardia: These problems will reverse with better BG control.  PLAN:  1. Diagnostic: HbA1c as above. Annual labs due in December.  2. Therapeutic:  We have reset her pump to adjust for her changes in diet and lifestyle. She needs significant less basal and needs to be able to bolus appropriately when she eats to cover carbs (rather than have carbs cover her basal).   Basal- Total 37.3 ->34.05 units  MN 0.65-->0.6  4a 0.8-->0.75 630a    1.35-->1.45 10 a     2.0-->1.85  9 pm    2.0-->1.45  Carb ratio MN 10 6a 8 11a  10 7p        10  Sensitivity  MN       100-->70 6 am     50 11 am   100-->50 10 pm    100-->70  Max bolus 10 units-->15 units    3. Patient education: Reviewed pump download and titrated insulin doses.  Discussed need to change sites regularly, check BGs regularly, and to bolus regularly. Conner asked appropriate questions and was satisfied  with discussion and plan today. She is to email me on Friday or Saturday and let me know how the changes to settings are. She is to call sooner if she is getting low. Discussed support during the school year and staying in contact regularly to make necessary changes to pump settings. Discussed DMV requirements and need to notify DMV if care is not improving. Discussed disability services at college and testing with high sugars. Discussed ways to test at school with her schedule being so busy.   4. Follow-up: 1 month    Level of Service: This visit lasted in excess of 40 minutes. More than 50% of the visit was devoted to counseling.    Temara Lanum T, FNP-C

## 2015-10-14 NOTE — Patient Instructions (Addendum)
Paula Miller [email or web address] - email on Friday or Saturday with sugars  Check 4 times a day We will need to make more changes If you are having bad lows, call us.

## 2015-10-20 ENCOUNTER — Other Ambulatory Visit: Payer: Self-pay | Admitting: *Deleted

## 2015-10-20 DIAGNOSIS — IMO0001 Reserved for inherently not codable concepts without codable children: Secondary | ICD-10-CM

## 2015-10-20 DIAGNOSIS — E1065 Type 1 diabetes mellitus with hyperglycemia: Secondary | ICD-10-CM

## 2015-10-20 MED ORDER — INSULIN GLARGINE 100 UNIT/ML SOLOSTAR PEN: PEN_INJECTOR | SUBCUTANEOUS | Status: DC

## 2015-10-20 MED ORDER — INSULIN LISPRO 100 UNIT/ML (KWIKPEN): PEN_INJECTOR | SUBCUTANEOUS | Status: DC

## 2015-11-18 ENCOUNTER — Encounter: Payer: Self-pay | Admitting: Family

## 2015-11-18 ENCOUNTER — Ambulatory Visit (INDEPENDENT_AMBULATORY_CARE_PROVIDER_SITE_OTHER): Payer: 59 | Admitting: Family

## 2015-11-18 VITALS — BP 116/75 | HR 113 | Wt 132.4 lb

## 2015-11-18 DIAGNOSIS — E1043 Type 1 diabetes mellitus with diabetic autonomic (poly)neuropathy: Secondary | ICD-10-CM | POA: Diagnosis not present

## 2015-11-18 DIAGNOSIS — E1065 Type 1 diabetes mellitus with hyperglycemia: Principal | ICD-10-CM

## 2015-11-18 DIAGNOSIS — K141 Geographic tongue: Secondary | ICD-10-CM

## 2015-11-18 DIAGNOSIS — E109 Type 1 diabetes mellitus without complications: Secondary | ICD-10-CM

## 2015-11-18 DIAGNOSIS — R634 Abnormal weight loss: Secondary | ICD-10-CM

## 2015-11-18 DIAGNOSIS — IMO0001 Reserved for inherently not codable concepts without codable children: Secondary | ICD-10-CM

## 2015-11-18 LAB — GLUCOSE, POCT (MANUAL RESULT ENTRY): POC Glucose: 275 mg/dl — AB (ref 70–99)

## 2015-11-18 NOTE — Progress Notes (Signed)
Subjective:  Patient Name: Paula Miller Date of Birth: Jun 21, 1996  MRN: [id number]  Paula Miller  presents to the office today for follow-up evaluation and management of her type 1 diabetes on insulin pump, hypoglycemia, hypoglycemic unawareness, autonomic neuropathy, inappropriate sinus tachycardia, and goiter  HISTORY OF PRESENT ILLNESS:   Paula Miller is a 19 y.o. Caucasian young lady.  Paula Miller was unaccompanied.  1. Paula Miller was diagnosed with new onset type 1 diabetes mellitus, dehydration, and ketonuria on 06/09/2005. She was initially treated with a multiple daily injection regimen of Lantus and Novolog insulins.  We converted her to a Medtronic insulin pump in November of 2007.  Although the patient does have a palpable goiter, she has remained euthyroid. Her CMPs, lipid panels, and urinary microalbumin to creatinine ratios have always been normal.   2. The patient's last PSSG visit was on 05/21/15. In the interim, she has been generally healthy.   She feels that she has been doing better, but still is dealing with a lot of stress at school and at home. She reports that over the break she has not been eating as well because her parents keep a lot of junk food around. She is testing her blood sugar more often but is not bolusing consistently when she eats. She reports that if she sleeps late, she will wake up lower. She continues to have trouble with performing her diabetes care in front of other people, she states that she has always been shy about doing diabetes care around others. She feels like she has good support between her family and her friends at school. 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    3. Pertinent Review of Systems:  Constitutional: The patient feels "pretty good". She is generally healthy.  Eyes: Vision is good with her glasses. Her most recent eye exam was December 2015. There were no signs of diabetic eye disease.  There are no other recognized eye problems.  Neck: The patient has no complaints of anterior neck swelling, soreness, tenderness, pressure, discomfort, or difficulty swallowing.   Heart: Heart rate increases with exercise or other physical activity. The patient has no complaints of palpitations, irregular heart beats, chest pain, or chest pressure.   Gastrointestinal: Bowel movents seem normal. The patient has no complaints of excessive hunger, acid reflux, upset stomach, stomach aches or pains, diarrhea, or constipation.  Legs: Muscle mass and strength seem normal. There are no complaints of numbness, tingling, burning, or pain. No edema is noted.  Feet: There are no obvious foot problems. There are no complaints of numbness, tingling, burning, or pain. No edema is noted. Neurologic: There are no recognized problems with muscle movement and strength, sensation, or coordination. Hypoglycemia: rare  [email or web address]  Diabetes ID: Wearing bracelet. Annual labs: Due December 2016. Orders placed   4. BG printout: Checking BG 2.4 times per day. 1 day of missed checks. Avg Bg 258 +- 112. She changes her site every 3-5 days. Only bolusing 1.6 times per day. 85% of insulin is basal.  Changing sites every 4-5 days. Checking avg 1.1 times per day. Missing 9 days of checks. Avg BG 344 +/- 125. Has not been bolusing for most glucoses and few carbs. 83% basal. TDI is ~ 45 units/day.   PAST MEDICAL, FAMILY, AND SOCIAL HISTORY  Past Medical History  Diagnosis Date  . Type 1 diabetes mellitus in patient age 39-19 years with HbA1C goal below 7.5   . Hypoglycemia  associated with diabetes ([place])   . Goiter   . Physical growth delay     Family History  Problem Relation Age of Onset  . Diabetes Mother   . Thyroid disease Maternal Grandmother      Current outpatient prescriptions:  .  doxycycline (ADOXA) 100 MG tablet, Take 100 mg by mouth 2 (two) times daily., Disp: , Rfl:  .  glucagon 1 MG  injection, Use for Severe Hypoglycemia . Inject 8m intramuscularly if unresponsive, unable to swallow, unconscious and/or has seizure, Disp: 2 kit, Rfl: 6 .  glucose blood (BAYER CONTOUR NEXT TEST) test strip, Checks blood sugar 10x daily, Disp: 900 each, Rfl: 4 .  Insulin Glargine (LANTUS SOLOSTAR) 100 UNIT/ML Solostar Pen, Use up to 50 units daily if pump fails., Disp: 5 pen, Rfl: 6 .  insulin glulisine (APIDRA) 100 UNIT/ML injection, Use 300 units in insulin pump every 48 hours, Disp: 20 vial, Rfl: 3 .  insulin lispro (HUMALOG KWIKPEN) 100 UNIT/ML KiwkPen, Up to 50 units/day if pump fails, Disp: 5 pen, Rfl: 6 .  Insulin Pen Needle 31G X 5 MM MISC, Check Blood glucose 10x day, Disp: 900 each, Rfl: 3 .  Multiple Vitamin (MULTIVITAMIN) tablet, Take 1 tablet by mouth daily.  , Disp: , Rfl:   Allergies as of 11/18/2015  . (No Known Allergies)     reports that she has never smoked. She has never used smokeless tobacco. She reports that she does not drink alcohol or use illicit drugs. Pediatric History  Patient Guardian Status  . Father:  TJazline, [place]  Other Topics Concern  . 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  Activities: Running and exercising regularly. Denies any alcohol use.  Primary Care Provider: DMaurine Cane MD  REVIEW OF SYSTEMS: There are no other significant problems involving Paula Miller's other body systems.   Objective:  Vital Signs:  BP 116/75 mmHg  Pulse 113  Wt 60.056 kg (132 lb 6.4 oz)    Ht Readings from Last 3 Encounters:  09/03/14 5' 3.94" (1.624 m) (46 %*, Z = -0.11)  05/02/14 5' 3.94" (1.624 m) (46 %*, Z = -0.10)  11/12/13 5' 3.78" (1.62 m) (44 %*, Z = -0.15)   * Growth percentiles are based on CDC 2-20 Years data.   Wt Readings from Last 3 Encounters:  11/18/15 60.056 kg (132 lb 6.4 oz) (60 %*, Z = 0.25)  10/14/15 62.097 kg (136 lb 14.4 oz) (67 %*, Z = 0.45)  05/21/15  59.421 kg (131 lb) (60 %*, Z = 0.25)   * Growth percentiles are based on CDC 2-20 Years data.   HC Readings from Last 3 Encounters:  [facility]  There is no height on file to calculate BSA. No height on file for this encounter. 60%ile (Z=0.25) based on CDC 2-20 Years weight-for-age data using vitals from 11/18/2015.  PHYSICAL EXAM:  Constitutional: The patient appears healthy.  The patient's height and weight are normal for age.  Head: The head is normocephalic. Face: The face appears normal. There are no obvious dysmorphic features. Eyes: The eyes appear to be normally formed and spaced. Gaze is conjugate. There is no obvious arcus or proptosis. Moisture appears normal. Mouth: The oropharynx appears normal. Her geographic tongue has returned. Oral moisture is normal. Neck: The neck appears to be visibly normal. The thyroid gland is normal for age. The consistency is normal. The  thyroid gland is not tender to palpation. Lungs: The lungs are clear to auscultation. Air movement is good. Heart: Heart rate and rhythm are regular. Heart sounds S1 and S2 are normal. I did not appreciate any pathologic cardiac murmurs. Abdomen: The abdomen is normal in size for the patient's age. Bowel sounds are normal. There is no obvious hepatomegaly, splenomegaly, or other mass effect.  Arms: Muscle size and bulk are normal for age. Hands: There is no obvious tremor. Phalangeal and metacarpophalangeal joints are normal. Palmar muscles are normal for age. Palmar skin is normal. Palmar moisture is also normal. Legs: Muscles appear normal for age. No edema is present. Feet: Feet are normally formed. Dorsalis pedal pulses are normal 1+ bilaterally. Neurologic: Strength is normal for age in both the upper and lower extremities. Muscle tone is normal. Sensation to touch is normal in both the legs and feet.    LAB DATA:   Results for orders placed or performed in visit on 11/18/15 (from the past 504  hour(s))  POCT Glucose (CBG)   Collection Time: 11/18/15 10:28 AM  Result Value Ref Range   POC Glucose 275 (A) 70 - 99 mg/dl    Labs 11/13/14: HbA1c 9.1%; CMP normal except for a glucose of 218; cholesterol 150, triglycerides 109, HDL 72, LDL 56; urinary microalbumin/creatinine ratio 2.7; TSH 1.634, free T4 1.12, free T3 4.2   Assessment and Plan:   ASSESSMENT:  1. Type 1 diabetes: Continues to have sub-optimal diabetes care. She is not bolusing consistently when she eats. She is making progress by checking her blood sugars more often.  2. Hypoglycemia: rare. Most lows occur when she sleeps late into the day.  3. Weight- 4 pound weight loss in last month.  4. Autonomic neuropathy and inappropriate sinus tachycardia: These problems will reverse with better BG control.  PLAN:  1. Diagnostic: Blood sugar as above. Annual labs today.  2. Therapeutic:  Continue current pump settings. Encouraged to bolus more consistently.  3. Patient education: Reviewed pump download and titrated insulin doses. Discussed changing pump sites more frequently and using other areas of the body other then her hips to scar tissue. Discussed importance of performing diabetes care, even when in public.  4. Follow-up: 3 months   Level of Service: This visit lasted in excess of 30 minutes. More than 50% of the visit was devoted to counseling.    Hermenia Bers, FNP-C

## 2015-11-18 NOTE — Patient Instructions (Signed)
-   Goals   - More boluses with food.   - Rotate sites. Try new sites that we discussed.

## 2015-11-19 ENCOUNTER — Other Ambulatory Visit: Payer: Self-pay | Admitting: Family

## 2015-11-19 DIAGNOSIS — IMO0001 Reserved for inherently not codable concepts without codable children: Secondary | ICD-10-CM

## 2015-11-19 DIAGNOSIS — E1065 Type 1 diabetes mellitus with hyperglycemia: Principal | ICD-10-CM

## 2016-01-16 ENCOUNTER — Telehealth: Payer: Self-pay | Admitting: Family Medicine

## 2016-01-16 NOTE — Telephone Encounter (Signed)
Records received from [facility].  Sending back for review. Please note what needs to be scanned or abstracted.

## 2016-01-17 ENCOUNTER — Inpatient Hospital Stay (HOSPITAL_COMMUNITY)
Admission: EM | Admit: 2016-01-17 | Discharge: 2016-01-17 | DRG: 919 | Disposition: A | Discharge status: Home / Self Care | Payer: 59 | Attending: Family Medicine | Admitting: Family Medicine

## 2016-01-17 ENCOUNTER — Emergency Department (HOSPITAL_COMMUNITY): Payer: 59

## 2016-01-17 ENCOUNTER — Encounter (HOSPITAL_COMMUNITY): Payer: Self-pay | Admitting: *Deleted

## 2016-01-17 ENCOUNTER — Encounter: Payer: Self-pay | Admitting: Pediatrics

## 2016-01-17 DIAGNOSIS — T85694A Other mechanical complication of insulin pump, initial encounter: Principal | ICD-10-CM | POA: Diagnosis present

## 2016-01-17 DIAGNOSIS — E875 Hyperkalemia: Secondary | ICD-10-CM | POA: Diagnosis present

## 2016-01-17 DIAGNOSIS — R112 Nausea with vomiting, unspecified: Secondary | ICD-10-CM

## 2016-01-17 DIAGNOSIS — N179 Acute kidney failure, unspecified: Secondary | ICD-10-CM | POA: Diagnosis present

## 2016-01-17 DIAGNOSIS — Z9641 Presence of insulin pump (external) (internal): Secondary | ICD-10-CM | POA: Diagnosis present

## 2016-01-17 DIAGNOSIS — E1010 Type 1 diabetes mellitus with ketoacidosis without coma: Secondary | ICD-10-CM | POA: Diagnosis present

## 2016-01-17 DIAGNOSIS — B379 Candidiasis, unspecified: Secondary | ICD-10-CM | POA: Diagnosis present

## 2016-01-17 DIAGNOSIS — R7989 Other specified abnormal findings of blood chemistry: Secondary | ICD-10-CM

## 2016-01-17 DIAGNOSIS — E049 Nontoxic goiter, unspecified: Secondary | ICD-10-CM | POA: Diagnosis present

## 2016-01-17 DIAGNOSIS — R739 Hyperglycemia, unspecified: Secondary | ICD-10-CM | POA: Diagnosis present

## 2016-01-17 DIAGNOSIS — E1110 Type 2 diabetes mellitus with ketoacidosis without coma: Secondary | ICD-10-CM | POA: Insufficient documentation

## 2016-01-17 LAB — DIFFERENTIAL
BASOS ABS: 0 10*3/uL (ref 0.0–0.1)
BASOS PCT: 0 %
EOS ABS: 0 10*3/uL (ref 0.0–0.7)
Eosinophils Relative: 0 %
Lymphocytes Relative: 7 %
Lymphs Abs: 1.7 10*3/uL (ref 0.7–4.0)
Monocytes Absolute: 1 10*3/uL (ref 0.1–1.0)
Monocytes Relative: 4 %
NEUTROS PCT: 89 %
Neutro Abs: 22.2 10*3/uL — ABNORMAL HIGH (ref 1.7–7.7)

## 2016-01-17 LAB — BASIC METABOLIC PANEL
ANION GAP: 26 — AB (ref 5–15)
Anion gap: 12 (ref 5–15)
Anion gap: 10 (ref 5–15)
Anion gap: 11 (ref 5–15)
Anion gap: 11 (ref 5–15)
BUN: 20 mg/dL (ref 6–20)
BUN: 18 mg/dL (ref 6–20)
BUN: 14 mg/dL (ref 6–20)
BUN: 12 mg/dL (ref 6–20)
BUN: 12 mg/dL (ref 6–20)
CALCIUM: 8.2 mg/dL — AB (ref 8.9–10.3)
CALCIUM: 8.2 mg/dL — AB (ref 8.9–10.3)
CALCIUM: 8.2 mg/dL — AB (ref 8.9–10.3)
CO2: 9 mmol/L — ABNORMAL LOW (ref 22–32)
CO2: 10 mmol/L — ABNORMAL LOW (ref 22–32)
CO2: 15 mmol/L — AB (ref 22–32)
CO2: 17 mmol/L — AB (ref 22–32)
CO2: 15 mmol/L — AB (ref 22–32)
CREATININE: 0.81 mg/dL (ref 0.44–1.00)
CREATININE: 0.64 mg/dL (ref 0.44–1.00)
CREATININE: 0.76 mg/dL (ref 0.44–1.00)
Calcium: 9.6 mg/dL (ref 8.9–10.3)
Calcium: 8.1 mg/dL — ABNORMAL LOW (ref 8.9–10.3)
Chloride: 100 mmol/L — ABNORMAL LOW (ref 101–111)
Chloride: 114 mmol/L — ABNORMAL HIGH (ref 101–111)
Chloride: 110 mmol/L (ref 101–111)
Chloride: 113 mmol/L — ABNORMAL HIGH (ref 101–111)
Chloride: 111 mmol/L (ref 101–111)
Creatinine, Ser: 1.52 mg/dL — ABNORMAL HIGH (ref 0.44–1.00)
Creatinine, Ser: 1.04 mg/dL — ABNORMAL HIGH (ref 0.44–1.00)
GFR calc Af Amer: 57 mL/min — ABNORMAL LOW (ref >60)
GFR calc Af Amer: >60 mL/min (ref >60)
GFR calc Af Amer: >60 mL/min (ref >60)
GFR calc Af Amer: >60 mL/min (ref >60)
GFR calc non Af Amer: >60 mL/min (ref >60)
GFR calc non Af Amer: >60 mL/min (ref >60)
GFR calc non Af Amer: >60 mL/min (ref >60)
GFR, EST NON AFRICAN AMERICAN: 49 mL/min — AB (ref >60)
GLUCOSE: 245 mg/dL — AB (ref 65–99)
GLUCOSE: 164 mg/dL — AB (ref 65–99)
GLUCOSE: 191 mg/dL — AB (ref 65–99)
Glucose, Bld: 511 mg/dL — ABNORMAL HIGH (ref 65–99)
Glucose, Bld: 286 mg/dL — ABNORMAL HIGH (ref 65–99)
POTASSIUM: 5.2 mmol/L — AB (ref 3.5–5.1)
Potassium: 4.8 mmol/L (ref 3.5–5.1)
Potassium: 4.3 mmol/L (ref 3.5–5.1)
Potassium: 3.9 mmol/L (ref 3.5–5.1)
Potassium: 4 mmol/L (ref 3.5–5.1)
SODIUM: 135 mmol/L (ref 135–145)
Sodium: 136 mmol/L (ref 135–145)
Sodium: 135 mmol/L (ref 135–145)
Sodium: 141 mmol/L (ref 135–145)
Sodium: 137 mmol/L (ref 135–145)

## 2016-01-17 LAB — CBG MONITORING, ED
GLUCOSE-CAPILLARY: 480 mg/dL — AB (ref 65–99)
GLUCOSE-CAPILLARY: 326 mg/dL — AB (ref 65–99)
GLUCOSE-CAPILLARY: 160 mg/dL — AB (ref 65–99)
GLUCOSE-CAPILLARY: 136 mg/dL — AB (ref 65–99)
GLUCOSE-CAPILLARY: 149 mg/dL — AB (ref 65–99)
GLUCOSE-CAPILLARY: 192 mg/dL — AB (ref 65–99)
GLUCOSE-CAPILLARY: 181 mg/dL — AB (ref 65–99)
GLUCOSE-CAPILLARY: 172 mg/dL — AB (ref 65–99)
Glucose-Capillary: 454 mg/dL — ABNORMAL HIGH (ref 65–99)
Glucose-Capillary: 183 mg/dL — ABNORMAL HIGH (ref 65–99)

## 2016-01-17 LAB — I-STAT VENOUS BLOOD GAS, ED
ACID-BASE DEFICIT: 16 mmol/L — AB (ref 0.0–2.0)
Bicarbonate: 11.5 mEq/L — ABNORMAL LOW (ref 20.0–24.0)
O2 SAT: 87 %
Patient temperature: 37
TCO2: 12 mmol/L (ref 0–100)
pCO2, Ven: 30.8 mmHg — ABNORMAL LOW (ref 45.0–50.0)
pH, Ven: 7.182 — CL (ref 7.250–7.300)
pO2, Ven: 64 mmHg — ABNORMAL HIGH (ref 30.0–45.0)

## 2016-01-17 LAB — URINE MICROSCOPIC-ADD ON

## 2016-01-17 LAB — CBC
HEMATOCRIT: 46.5 % — AB (ref 36.0–46.0)
HEMATOCRIT: 35.9 % — AB (ref 36.0–46.0)
HEMOGLOBIN: 12.2 g/dL (ref 12.0–15.0)
Hemoglobin: 15.9 g/dL — ABNORMAL HIGH (ref 12.0–15.0)
MCH: 30.2 pg (ref 26.0–34.0)
MCH: 29.6 pg (ref 26.0–34.0)
MCHC: 34.2 g/dL (ref 30.0–36.0)
MCHC: 34 g/dL (ref 30.0–36.0)
MCV: 88.4 fL (ref 78.0–100.0)
MCV: 87.1 fL (ref 78.0–100.0)
Platelets: 352 10*3/uL (ref 150–400)
Platelets: 227 10*3/uL (ref 150–400)
RBC: 5.26 MIL/uL — ABNORMAL HIGH (ref 3.87–5.11)
RBC: 4.12 MIL/uL (ref 3.87–5.11)
RDW: 12.8 % (ref 11.5–15.5)
RDW: 13 % (ref 11.5–15.5)
WBC: 26.4 10*3/uL — AB (ref 4.0–10.5)
WBC: 12.8 10*3/uL — ABNORMAL HIGH (ref 4.0–10.5)

## 2016-01-17 LAB — URINALYSIS, ROUTINE W REFLEX MICROSCOPIC
Bilirubin Urine: NEGATIVE
Glucose, UA: >1000 mg/dL — AB
LEUKOCYTES UA: NEGATIVE
NITRITE: NEGATIVE
PH: 5 (ref 5.0–8.0)
PROTEIN: 30 mg/dL — AB
Specific Gravity, Urine: 1.031 — ABNORMAL HIGH (ref 1.005–1.030)

## 2016-01-17 LAB — HEPATIC FUNCTION PANEL
ALBUMIN: 4.8 g/dL (ref 3.5–5.0)
ALT: 24 U/L (ref 14–54)
AST: 34 U/L (ref 15–41)
Alkaline Phosphatase: 113 U/L (ref 38–126)
Bilirubin, Direct: 0.3 mg/dL (ref 0.1–0.5)
Indirect Bilirubin: 1.2 mg/dL — ABNORMAL HIGH (ref 0.3–0.9)
TOTAL PROTEIN: 8.8 g/dL — AB (ref 6.5–8.1)
Total Bilirubin: 1.5 mg/dL — ABNORMAL HIGH (ref 0.3–1.2)

## 2016-01-17 LAB — TSH: TSH: 1.173 u[IU]/mL (ref 0.350–4.500)

## 2016-01-17 LAB — I-STAT BETA HCG BLOOD, ED (MC, WL, AP ONLY): I-stat hCG, quantitative: <5 m[IU]/mL (ref <5)

## 2016-01-17 LAB — T4, FREE: FREE T4: 0.85 ng/dL (ref 0.61–1.12)

## 2016-01-17 LAB — I-STAT CG4 LACTIC ACID, ED: LACTIC ACID, VENOUS: 5.61 mmol/L — AB (ref 0.5–2.0)

## 2016-01-17 LAB — LIPASE, BLOOD: LIPASE: 26 U/L (ref 11–51)

## 2016-01-17 MED ORDER — SODIUM CHLORIDE 0.9 % IV SOLN
INTRAVENOUS | Status: DC
  Administered 2016-01-17: 06:00:00 via INTRAVENOUS

## 2016-01-17 MED ORDER — DEXTROSE-NACL 5-0.45 % IV SOLN
INTRAVENOUS | Status: DC
  Administered 2016-01-17: 09:00:00 via INTRAVENOUS

## 2016-01-17 MED ORDER — SODIUM CHLORIDE 0.9% FLUSH: 3.0000 mL | Freq: Two times a day (BID) | INTRAVENOUS | Status: DC

## 2016-01-17 MED ORDER — SODIUM CHLORIDE 0.9 % IV SOLN
1000.0000 mL | Freq: Once | INTRAVENOUS | Status: AC
  Administered 2016-01-17: 1000 mL via INTRAVENOUS

## 2016-01-17 MED ORDER — ALBUTEROL SULFATE (2.5 MG/3ML) 0.083% IN NEBU: 2.5000 mg | INHALATION_SOLUTION | RESPIRATORY_TRACT | Status: DC | PRN

## 2016-01-17 MED ORDER — ACETAMINOPHEN 650 MG RE SUPP: 650.0000 mg | Freq: Four times a day (QID) | RECTAL | Status: DC | PRN

## 2016-01-17 MED ORDER — INSULIN GLARGINE 100 UNIT/ML ~~LOC~~ SOLN
10.0000 [IU] | Freq: Every day | SUBCUTANEOUS | Status: DC
  Administered 2016-01-17: 10 [IU] via SUBCUTANEOUS
  Filled 2016-01-17: qty 0.1

## 2016-01-17 MED ORDER — SODIUM CHLORIDE 0.9 % IV SOLN
INTRAVENOUS | Status: DC
  Administered 2016-01-17: 2.7 [IU]/h via INTRAVENOUS
  Filled 2016-01-17: qty 2.5

## 2016-01-17 MED ORDER — ONDANSETRON HCL 4 MG/2ML IJ SOLN: 4.0000 mg | Freq: Three times a day (TID) | INTRAMUSCULAR | Status: AC | PRN

## 2016-01-17 MED ORDER — SODIUM CHLORIDE 0.9 % IV SOLN: 1000.0000 mL | INTRAVENOUS | Status: DC

## 2016-01-17 MED ORDER — SODIUM CHLORIDE 0.9 % IV SOLN
INTRAVENOUS | Status: DC
  Administered 2016-01-17: 0.5 [IU]/h via INTRAVENOUS
  Filled 2016-01-17: qty 2.5

## 2016-01-17 MED ORDER — DEXTROSE 50 % IV SOLN: 25.0000 mL | INTRAVENOUS | Status: DC | PRN

## 2016-01-17 MED ORDER — INSULIN ASPART 100 UNIT/ML ~~LOC~~ SOLN: 0.0000 [IU] | Freq: Three times a day (TID) | SUBCUTANEOUS | Status: DC

## 2016-01-17 MED ORDER — ONDANSETRON HCL 4 MG/2ML IJ SOLN
4.0000 mg | Freq: Once | INTRAMUSCULAR | Status: AC
  Administered 2016-01-17: 4 mg via INTRAVENOUS
  Filled 2016-01-17: qty 2

## 2016-01-17 MED ORDER — ENOXAPARIN SODIUM 40 MG/0.4ML ~~LOC~~ SOLN
40.0000 mg | SUBCUTANEOUS | Status: DC
  Filled 2016-01-17: qty 0.4

## 2016-01-17 MED ORDER — SODIUM CHLORIDE 0.9 % IV SOLN
INTRAVENOUS | Status: DC
  Administered 2016-01-17: 09:00:00 via INTRAVENOUS

## 2016-01-17 MED ORDER — DEXTROSE-NACL 5-0.45 % IV SOLN
INTRAVENOUS | Status: DC
  Administered 2016-01-17: 07:00:00 via INTRAVENOUS

## 2016-01-17 MED ORDER — SODIUM CHLORIDE 0.9 % IV SOLN: INTRAVENOUS | Status: DC

## 2016-01-17 MED ORDER — INSULIN REGULAR BOLUS VIA INFUSION
0.0000 [IU] | Freq: Three times a day (TID) | INTRAVENOUS | Status: DC
  Filled 2016-01-17: qty 10

## 2016-01-17 MED ORDER — INSULIN ASPART 100 UNIT/ML ~~LOC~~ SOLN
10.0000 [IU] | Freq: Once | SUBCUTANEOUS | Status: AC
  Administered 2016-01-17: 10 [IU] via INTRAVENOUS
  Filled 2016-01-17: qty 1

## 2016-01-17 MED ORDER — ACETAMINOPHEN 325 MG PO TABS
650.0000 mg | ORAL_TABLET | Freq: Four times a day (QID) | ORAL | Status: DC | PRN
  Administered 2016-01-17: 650 mg via ORAL
  Filled 2016-01-17: qty 2

## 2016-01-17 NOTE — Progress Notes (Signed)
Inpatient Diabetes Program Recommendations  AACE/ADA: New Consensus Statement on Inpatient Glycemic Control (2015)  Target Ranges:  Prepandial:   less than 140 mg/dL      Peak postprandial:   less than 180 mg/dL (1-2 hours)      Critically ill patients:  140 - 180 mg/dL   Spoke with patient over the phone. Patient reports great knowledge of pump and pump sites. Patient reports events leading up to her DKA was within a day and when her glucose spiked she was in class and not close to her dorm where she could get her SQ insulin for back up if her pump failed. Patient has been on insulin pump for 2 years ad changes her site every 3-4 days. If pump fails patient takes Lantus 50 units Daily and Humalog up to 50 units with 24 hrs for correction and meal coverage. Patient uses Apidra in her pump currently. Patient's current lab work looks as though she is still acidotic due to her CO2 at 15. Spoke to patient about possibly either restarting her pump or using her home regimen of SQ insulin to cover her until she is able to place her pump back on herself. Patient reports she can get someone to pick her pump supplies up from her dorm and bring them to the hospital whenever it is time for her to transition her back to her pump after the acidosis clears. Patient prefers to be placed back on her pump. Informed patient that our policy is for her to change her site again before restarting her pump. Patient is agreeable. Patient does not have any questions for coordinator at this time.  RN to print insulin pump flowsheet and contract from the insulin pump order set if ordered from MD.  Thanks,  Christena Deem RN, MSN, [facility] Inpatient Diabetes Coordinator Team Pager [phone] (8a-5p)

## 2016-01-17 NOTE — ED Notes (Signed)
Called Pharmacy for lantus.

## 2016-01-17 NOTE — Consult Note (Signed)
[facility] [address] [place], [address] [place], [place] [address] Telephone: [phone]     Fax: [phone]  INITIAL CONSULTATION NOTE (PEDIATRICS)  NAME: Paula Miller, Paula Miller  DATE OF BIRTH: 07-01-1996 MEDICAL RECORD NUMBER: [id number] SOURCE OF REFERRAL: Meredeth Ide, MD DATE OF CONSULT: 01/17/2016  CHIEF COMPLAINT: DKA in patient with known T1DM PROBLEM LIST: Principal Problem:   DKA, type 1 (HCC) Active Problems:   Presence of insulin pump   Nausea & vomiting   Acute kidney injury (HCC)   Yeast infection   Hyperkalemia   HISTORY OF PRESENT ILLNESS:  Paula Miller is a 20 yo female with history of T1DM diagnosed in 05/2005 who presents with several days of hyperglycemia and 1 day of vomiting found to be in DKA.   She lives at [facility] and reports having high blood sugars for several days.  She started vomiting in the morning of 01/16/16, then had her friends bring her home from school after she vomited all day.  Her mother changed her pump site, put new insulin in her pump cartridge, and started giving correction around 11PM last night, though she continued to vomit and her blood sugar remained high. She was brought to the adult ED where pump was discontinued, pH was 7.182, bicarb was 11.5, glucose was 511, lactic acid was 5.61, UA showed >1000 glucose and >80 ketones.  She received 10 units of subcutaneous novolog and was started on an insulin drip (around 5AM).  She also received IV fluid hydration.  She was given 10 units of lantus around 10AM and her insulin drip was discontinued around noon.  She ate lunch though did not receive subcutaneous insulin for carb coverage as she was still on the insulin drip.    Paula Miller reports her blood sugars were high for several days.  She reports changing her pump site in the morning on 01/16/16 though after reviewing her pump history it was discovered that her last pump change site was 01/13/2016 (mom did change her  site at 11:27PM on 01/16/16).  Review of information in her pump shows the following settings:  Basal Rates 12AM 0.65  4AM 0.75  6:30AM 1.45  10AM 1.85  9PM 1.45    Insulin to Carbohydrate Ratio 12AM 10  6AM 8  11AM 10  5PM 10       Insulin Sensitivity Factor 12AM 70  6AM 50  10PM 70         Target Blood Glucose 12AM 150  6AM 110  11PM 150         She only bolused once on 01/14/16 (8:46AM), then three times on 01/15/16.  Her glucometer was not downloaded but review of her pump shows blood sugars from 01/14/16 until admission ranged from 256-533, with all blood sugars above 400 since 01/15/16.     She reports a significant amount of stress related to school.  She denies fevers or URI symptoms.  No recent change in medications.  She has no prior DKA episodes except at diabetes diagnosis.  Her last outpatient visit to [facility] was 11/18/2015.  Her last A1c was 12.9% obtained 10/14/15.   REVIEW OF SYSTEMS: Greater than 10 systems reviewed with pertinent positives listed in HPI, otherwise negative.              PAST MEDICAL HISTORY:  Past Medical History  Diagnosis Date  . Type 1 diabetes mellitus in patient age 70-19 years with HbA1C goal below 7.5   . Hypoglycemia associated with diabetes (  HCC)   . Goiter   . Physical growth delay     MEDICATIONS:  Home meds include: Allergy medication Multivitamin Belviq BID prescribed by her gynecologist for weight maintenance   ALLERGIES: No Known Allergies  SURGERIES:  Past Surgical History  Procedure Laterality Date  . Frenulectomy, lingual    . Periodontal surgery       FAMILY HISTORY:  Mother had gestational diabetes during a different pregnancy, though no other family history of diabetes.  SOCIAL HISTORY: Attends [employer], in her second year though academically she is considered a junior  PHYSICAL EXAMINATION: BP 107/61 mmHg  Pulse 108  Temp(Src) 97.8 F (36.6 C) (Oral)  Resp 15  SpO2 100%  LMP  01/10/2016 Temp:  [97.8 F (36.6 C)] 97.8 F (36.6 C) (02/25 0242) Pulse Rate:  [105-134] 108 (02/25 1130) Cardiac Rhythm:  [-]  Resp:  [14-22] 15 (02/25 1130) BP: (104-135)/(61-102) 107/61 mmHg (02/25 1130) SpO2:  [98 %-100 %] 100 % (02/25 1130)  General: Well developed, well nourished female in no acute distress.  Appears stated age.  Laying in bed comfortably.  Answers questions appropriately. Has IV in left upper arm though no IV fluids are infusing currently. Head: Normocephalic, atraumatic.   Eyes:  Pupils equal and round. EOMI.   Sclera white.  No eye drainage.   Ears/Nose/Mouth/Throat: Nares patent, no nasal drainage.  Normal dentition, mucous membranes moist.  Oropharynx intact. Neck: supple, no cervical lymphadenopathy, no thyromegaly Cardiovascular: tachycardic to just above 100, normal S1/S2, no murmurs Respiratory: No increased work of breathing.  Lungs clear to auscultation bilaterally.  No wheezes. Abdomen: soft, nontender, nondistended. Normal bowel sounds.  No appreciable masses  Extremities: warm, well perfused, cap refill < 2 sec.   Musculoskeletal: Normal muscle mass.  No deformity Skin: warm, dry.  No rash or lesions. Neurologic: alert and oriented, normal speech  LABS: BG trended down to the mid 100s while on the insulin drip from 6AM to 1PM (136-183) BG 307 around 2:15PM  Most recent BMP: 01/17/16 at 12:27PM Na 141, K 3.9, Cl 113, CO2 17, BUN 12, Cr 0.64, BG 191, Anion gap 11   ASSESSMENT/RECOMMENDATIONS: Paula Miller is a 20 y.o. female with poorly controlled type 1 diabetes who presented in DKA.  DKA was likely due to pump site failure.  She was placed on an insulin drip briefly with closure of anion gap and improvement in CO2.  She is also receiving IV hydration.  I reviewed pump settings, performed pump self-test (completed without problems), and verified that pump delivers insulin (performed 2 separate cannula fills with resultant insulin release from end of  tubing).  Her pump appears to be working appropriately at this time.  I placed a mio 6mm insulin pump site on her right upper buttock, watched mother fill a new cartridge with apidra insulin, and delivered a 0.7unit cannula fill.  I then corrected for her BG of 307 (3.75 units delivered at 2:55PM).  I also set a temporary basal rate of 80% for 19 hours starting at 3PM to account for 10 units of lantus that she received at 10AM (total daily basal amount of her pump is 34.25 units).   -Continue IV hydration with NS at 125ml/hr.  I advised her to drink as much as possible -Check BG q 2 hours and correct via insulin pump -May eat dinner and give carb coverage via insulin pump -Recommend repeating a BMP around 5-6PM to ensure that CO2 continues to normalize.  Please also draw a  TSH and FT4 as part of her annual screening labs. -Paula Miller is very anxious about being hospitalized and both she and her family are interested in going home tonight if possible.  I discussed with them that she may be released later this evening if her labs continue to show improvement, if she continues to be able to keep food down, and feels better.  I will provide her with our "Sick day Management" and have her family contact me tomorrow to update me on her condition.  Please feel free to call me with questions at [phone]  Casimiro Needle, MD 01/17/2016

## 2016-01-17 NOTE — H&P (Signed)
[facility] History and Physical  Paula Miller WUJ:[id number] DOB: 01-26-1996 DOA: 01/17/2016  Referring physician: ED PCP: Lyda Perone, MD   Chief Complaint: Nausea and vomiting  HPI:  Paula Miller is a 20 year old female with past medical history of type 1 diabetes on insulin pump; who presents acutely with complaints of abdominal pain with nausea, vomiting, and hyperglycemia. Symptoms initially started 2 days ago with her not being able to get him adequate control of her blood sugars. She notes trying changing the needle site for which her insulin pump administers insulin without relief of symptoms. Blood glucose) the 200s earlier in the week and 400s prior to arrival. Around 10 AM this morning she started developing nausea and vomiting. Associated symptoms included chills and shortness of breath. Patient unable to keep any food down. She notes trying to drink water to keep herself hydrated, but ultimately came into the emergency department for further evaluation. She believes that her meter was working properly otherwise. She also denies any frequency of urination, dysuria, discharge, fever, cough, or headache. Patient denies any previous history of having similar symptoms like this in the past.   Upon admission patient's initial evaluation revealed a blood glucose of 511 with anion gap of 26. UA was positive for glucose, signs of yeast, negative nitrites,  negative leukocyte Estrace, and negative ketones.    Review of Systems  Constitutional: Positive for chills and malaise/fatigue. Negative for fever.  HENT: Negative for hearing loss and tinnitus.   Eyes: Negative for photophobia and pain.  Respiratory: Positive for shortness of breath. Negative for cough.   Cardiovascular: Negative for chest pain, palpitations and orthopnea.  Gastrointestinal: Positive for nausea and vomiting. Negative for diarrhea and constipation.  Genitourinary: Positive for frequency. Negative for dysuria and  urgency.  Musculoskeletal: Negative for back pain and neck pain.  Skin: Negative for itching and rash.  Neurological: Negative for tremors, seizures and loss of consciousness.  Endo/Heme/Allergies: Negative for environmental allergies and polydipsia.  Psychiatric/Behavioral: Negative for hallucinations. The patient is not nervous/anxious.       Past Medical History  Diagnosis Date  . Type 1 diabetes mellitus in patient age 59-19 years with HbA1C goal below 7.5   . Hypoglycemia associated with diabetes (HCC)   . Goiter   . Physical growth delay      Past Surgical History  Procedure Laterality Date  . Frenulectomy, lingual    . Periodontal surgery        Social History:  reports that she has never smoked. She has never used smokeless tobacco. She reports that she does not drink alcohol or use illicit drugs.   No Known Allergies  Family History  Problem Relation Age of Onset  . Diabetes Mother   . Thyroid disease Maternal Grandmother         Prior to Admission medications   Medication Sig Start Date End Date Taking? Authorizing Provider  BELVIQ 10 MG TABS Take 1 tablet by mouth 2 (two) times daily. 12/16/15  Yes Historical Provider, MD  glucagon 1 MG injection Use for Severe Hypoglycemia . Inject 1mg  intramuscularly if unresponsive, unable to swallow, unconscious and/or has seizure 10/14/15  Yes Verneda Skill, FNP  Insulin Glargine (LANTUS SOLOSTAR) 100 UNIT/ML Solostar Pen Use up to 50 units daily if pump fails. 10/20/15  Yes Dessa Phi, MD  insulin glulisine (APIDRA) 100 UNIT/ML injection Use 300 units in insulin pump every 48 hours 07/30/15  Yes Dessa Phi, MD  insulin lispro (HUMALOG KWIKPEN) 100 UNIT/ML  KiwkPen Up to 50 units/day if pump fails 10/20/15  Yes Dessa Phi, MD  Multiple Vitamin (MULTIVITAMIN) tablet Take 1 tablet by mouth daily.     Yes Historical Provider, MD  glucose blood (BAYER CONTOUR NEXT TEST) test strip Checks blood sugar 10x daily  12/06/14   Dessa Phi, MD  Insulin Pen Needle 31G X 5 MM MISC Check Blood glucose 10x day 05/16/13   David Stall, MD     Physical Exam: Filed Vitals:   01/17/16 0415 01/17/16 0430 01/17/16 0445 01/17/16 0500  BP: 115/74 126/74 135/102 122/80  Pulse: 114 113 115 111  Temp:      TempSrc:      Resp: SpO2: 100% 100% 100% 100%     Constitutional: Vital signs reviewed. Patient appears ill in mild distress, and appears sleepy.  Head: Normocephalic and atraumatic  Ear: TM normal bilaterally  Mouth: no erythema or exudates, dry mucous membranes Eyes: PERRL, EOMI, conjunctivae normal, No scleral icterus.  Neck: Supple, Trachea midline normal ROM, No JVD, mass, thyromegaly, or carotid bruit present.  Cardiovascular:  tachycardic Pulmonary/Chest: CTAB, no wheezes, rales, or rhonchi  Abdominal: Soft. Non-tender, non-distended, bowel sounds are normal, no masses, organomegaly, or guarding present.  GU: no CVA tenderness Musculoskeletal: No joint deformities, erythema, or stiffness, ROM full and no nontender Ext: no edema and no cyanosis, pulses palpable bilaterally (DP and PT)  Hematology: no cervical, inginal, or axillary adenopathy.  Neurological:Strenght is normal and symmetric bilaterally, cranial nerve II-XII are grossly intact, no focal motor deficit, sensory intact to light touch bilaterally.  Skin: Warm, dry and intact. No rash, cyanosis, or clubbing.  Psychiatric: Normal mood and affect. speech and behavior is normal. Judgment and thought content normal. Cognition and memory are normal.      Data Review   Micro Results No results found for this or any previous visit (from the past 240 hour(s)).  Radiology Reports Dg Chest Port 1 View  01/17/2016  CLINICAL DATA:  Hyperglycemia today. EXAM: PORTABLE CHEST 1 VIEW COMPARISON:  None. FINDINGS: The cardiomediastinal contours are normal. The lungs are clear. Pulmonary vasculature is normal. No consolidation,  pleural effusion, or pneumothorax. No acute osseous abnormalities are seen. IMPRESSION: Normal chest radiograph. Electronically Signed   By: Rubye Oaks M.D.   On: 01/17/2016 03:33     CBC  Recent Labs Lab 01/17/16 0245  WBC 26.4*  HGB 15.9*  HCT 46.5*  PLT 352  MCV 88.4  MCH 30.2  MCHC 34.2  RDW 12.8  LYMPHSABS 1.7  MONOABS 1.0  EOSABS 0.0  BASOSABS 0.0    Chemistries   Recent Labs Lab 01/17/16 0245  NA 135  K 5.2*  CL 100*  CO2 9*  GLUCOSE 511*  BUN 20  CREATININE 1.52*  CALCIUM 9.6  AST 34  ALT 24  ALKPHOS 113  BILITOT 1.5*   ------------------------------------------------------------------------------------------------------------------ CrCl cannot be calculated (Unknown ideal weight.). ------------------------------------------------------------------------------------------------------------------ No results for input(s): HGBA1C in the last 72 hours. ------------------------------------------------------------------------------------------------------------------ No results for input(s): CHOL, HDL, LDLCALC, TRIG, CHOLHDL, LDLDIRECT in the last 72 hours. ------------------------------------------------------------------------------------------------------------------ No results for input(s): TSH, T4TOTAL, T3FREE, THYROIDAB in the last 72 hours.  Invalid input(s): FREET3 ------------------------------------------------------------------------------------------------------------------ No results for input(s): VITAMINB12, FOLATE, FERRITIN, TIBC, IRON, RETICCTPCT in the last 72 hours.  Coagulation profile No results for input(s): INR, PROTIME in the last 168 hours.  No results for input(s): DDIMER in the last 72 hours.  Cardiac Enzymes No results for input(s): CKMB, TROPONINI, MYOGLOBIN  in the last 168 hours.  Invalid input(s):  CK ------------------------------------------------------------------------------------------------------------------ Invalid input(s): POCBNP   CBG:  Recent Labs Lab 01/17/16 0240 01/17/16 0328 01/17/16 0512  GLUCAP 454* 480* 326*          Assessment/Plan Probable DKA, type 1: Acute. Patient with initial blood glucose of 511 and anion gap of 25. UA did not show ketones and initial blood gas was delayed due to family. - Admit to stepdown - Glucose stabilizer per protocol - BMPs every 3 hours( 3 ordered) to ensure that the anion gap closes. Patient will need to be transitioned off drip back to insulin pump if it is functioning properly Has remained closed. - Diabetic educator: Patient likely needs help or assistance with alternative needle placement sites for the insulin pump and troubleshooting suggestions.  - Follow-up electrolytes and replace as needed.  Nausea & vomiting: Question if patient possibly had a gastroenteritis or if symptoms are secondary to DKA. - Zofran as needed  Presence of insulin pump: Currently, insulin pump turned off at this time - Would recommend interrogation of insulin pump to ensure that is functioning properly    Acute kidney injury: Baseline creatinine within normal limits, but acutely elevated at 1.52 on admission. Likely prerenal in nature  - Follow-up repeat BMPs   Hyperkalemia: Mild. Potassium 5.2 on admission. Patient started on insulin drip. - Continue to monitor  Possible yeast  Infection: Patient currently denies having any symptoms or discharge. - Consider giving fluconazole  Code Status:   full Family Communication: bedside Disposition Plan: admit   Total time spent 55 minutes.Greater than 50% of this time was spent in counseling, explanation of diagnosis, planning of further management, and coordination of care  Clydie Braun [facility] Pager [phone]  If 7PM-7AM, please contact  night-coverage [email or web address] Password [facility] 01/17/2016, 5:47 AM

## 2016-01-17 NOTE — ED Notes (Signed)
Paged MD advised patient CBG x2 less than 180 Gluosestablizer advised to turn off. MD advised to follow DKA protocol. RN advised gap is now closed request ordered for Long acting insulin. MD advised continue stablelizer until 2 hours after Lantuss given.

## 2016-01-17 NOTE — ED Provider Notes (Signed)
CSN: [id number]     Arrival date & time 01/17/16  0235 History  By signing my name below, I, Emmanuella Mensah, attest that this documentation has been prepared under the direction and in the presence of Dione Booze, MD. Electronically Signed: Angelene Giovanni, ED Scribe. 01/17/2016. 3:16 AM.    Chief Complaint  Patient presents with  . Hyperglycemia   The history is provided by the patient. No language interpreter was used.   HPI Comments: Paula Miller is a 20 y.o. female with a hx of DM I who presents to the Emergency Department complaining of ongoing hyperglycemia onset 2 days ago. She states that her blood sugar was in the 400's PTA. She reports associated nausea, vomiting, intermittent chills, and SOB. She states that she has been compliant with her medication. No alleviating factors noted. She denies any fever, diaphoresis or increased urination.    Past Medical History  Diagnosis Date  . Type 1 diabetes mellitus in patient age 46-19 years with HbA1C goal below 7.5   . Hypoglycemia associated with diabetes (HCC)   . Goiter   . Physical growth delay    Past Surgical History  Procedure Laterality Date  . Frenulectomy, lingual    . Periodontal surgery     Family History  Problem Relation Age of Onset  . Diabetes Mother   . Thyroid disease Maternal Grandmother    Social History  Substance Use Topics  . Smoking status: Never Smoker   . Smokeless tobacco: Never Used  . Alcohol Use: No   OB History    No data available     Review of Systems  Constitutional: Positive for chills. Negative for fever and diaphoresis.  Respiratory: Positive for shortness of breath.   Gastrointestinal: Positive for nausea and vomiting.  Endocrine: Negative for polyuria.  All other systems reviewed and are negative.     Allergies  Review of patient's allergies indicates no known allergies.  Home Medications   Prior to Admission medications   Medication Sig Start Date End Date  Taking? Authorizing Provider  doxycycline (ADOXA) 100 MG tablet Take 100 mg by mouth 2 (two) times daily.    Historical Provider, MD  glucagon 1 MG injection Use for Severe Hypoglycemia . Inject  intramuscularly if unresponsive, unable to swallow, unconscious and/or has seizure 10/14/15   Verneda Skill, FNP  glucose blood (BAYER CONTOUR NEXT TEST) test strip Checks blood sugar 10x daily 12/06/14   Dessa Phi, MD  Insulin Glargine (LANTUS SOLOSTAR) 100 UNIT/ML Solostar Pen Use up to 50 units daily if pump fails. 10/20/15   Dessa Phi, MD  insulin glulisine (APIDRA) 100 UNIT/ML injection Use 300 units in insulin pump every 48 hours 07/30/15   Dessa Phi, MD  insulin lispro (HUMALOG KWIKPEN) 100 UNIT/ML KiwkPen Up to 50 units/day if pump fails 10/20/15   Dessa Phi, MD  Insulin Pen Needle 31G X 5 MM MISC Check Blood glucose 10x day 05/16/13   Naaman Curro Stall, MD  Multiple Vitamin (MULTIVITAMIN) tablet Take 1 tablet by mouth daily.      Historical Provider, MD   BP 119/87 mmHg  Pulse 121  Temp(Src) 97.8 F (36.6 C) (Oral)  Resp 20  SpO2 99%  LMP 01/10/2016 Physical Exam  Constitutional: She is oriented to person, place, and time. She appears well-developed and well-nourished. No distress.  Tachypneic without overt Kussmaul respirations  HENT:  Head: Normocephalic and atraumatic.  Eyes: Conjunctivae and EOM are normal. Pupils are equal, round, and reactive to light.  Neck: Normal range of motion. Neck supple. No JVD present.  Cardiovascular: Regular rhythm and normal heart sounds.   No murmur heard. Tachycardia  Pulmonary/Chest: Effort normal and breath sounds normal. She has no wheezes. She has no rales. She exhibits no tenderness.  Abdominal: Soft. She exhibits no distension and no mass. There is no tenderness.  Bowel sounds decreased  Musculoskeletal: Normal range of motion. She exhibits no edema.  Lymphadenopathy:    She has no cervical adenopathy.  Neurological:  She is alert and oriented to person, place, and time. No cranial nerve deficit. She exhibits normal muscle tone. Coordination normal.  Skin: Skin is warm and dry. No rash noted.  Psychiatric: She has a normal mood and affect. Her behavior is normal. Judgment and thought content normal.  Nursing note and vitals reviewed.   ED Course  Procedures (including critical care time) DIAGNOSTIC STUDIES: Oxygen Saturation is 99% on RA, normal by my interpretation.    COORDINATION OF CARE: 3:13 AM- Pt advised of plan for treatment and pt agrees. Pt will receive lab work and chest x-ray for further evaluation. She will also receive IV fluids with Zofran IM.    Labs Review Results for orders placed or performed during the hospital encounter of 01/17/16  Basic metabolic panel  Result Value Ref Range   Sodium 135 135 - 145 mmol/L   Potassium 5.2 (H) 3.5 - 5.1 mmol/L   Chloride 100 (L) 101 - 111 mmol/L   CO2 9 (L) 22 - 32 mmol/L   Glucose, Bld 511 (H) 65 - 99 mg/dL   BUN 20 6 - 20 mg/dL   Creatinine, Ser 4.09 (H) 0.44 - 1.00 mg/dL   Calcium 9.6 8.9 - 81.1 mg/dL   GFR calc non Af Amer 49 (L) >60 mL/min   GFR calc Af Amer 57 (L) >60 mL/min   Anion gap 26 (H) 5 - 15  CBC  Result Value Ref Range   WBC 26.4 (H) 4.0 - 10.5 K/uL   RBC 5.26 (H) 3.87 - 5.11 MIL/uL   Hemoglobin 15.9 (H) 12.0 - 15.0 g/dL   HCT 91.4 (H) 78.2 - 95.6 %   MCV 88.4 78.0 - 100.0 fL   MCH 30.2 26.0 - 34.0 pg   MCHC 34.2 30.0 - 36.0 g/dL   RDW 21.3 08.6 - 57.8 %   Platelets 352 150 - 400 K/uL  Urinalysis, Routine w reflex microscopic (not at [facility])  Result Value Ref Range   Color, Urine YELLOW YELLOW   APPearance CLOUDY (A) CLEAR   Specific Gravity, Urine 1.031 (H) 1.005 - 1.030   pH 5.0 5.0 - 8.0   Glucose, UA >1000 (A) NEGATIVE mg/dL   Hgb urine dipstick TRACE (A) NEGATIVE   Bilirubin Urine NEGATIVE NEGATIVE   Ketones, ur >80 (A) NEGATIVE mg/dL   Protein, ur 30 (A) NEGATIVE mg/dL   Nitrite NEGATIVE NEGATIVE    Leukocytes, UA NEGATIVE NEGATIVE  Differential  Result Value Ref Range   Neutrophils Relative % 89 %   Neutro Abs 22.2 (H) 1.7 - 7.7 K/uL   Lymphocytes Relative 7 %   Lymphs Abs 1.7 0.7 - 4.0 K/uL   Monocytes Relative 4 %   Monocytes Absolute 1.0 0.1 - 1.0 K/uL   Eosinophils Relative 0 %   Eosinophils Absolute 0.0 0.0 - 0.7 K/uL   Basophils Relative 0 %   Basophils Absolute 0.0 0.0 - 0.1 K/uL  Lipase, blood  Result Value Ref Range   Lipase 26 11 -  51 U/L  Hepatic function panel  Result Value Ref Range   Total Protein 8.8 (H) 6.5 - 8.1 g/dL   Albumin 4.8 3.5 - 5.0 g/dL   AST 34 15 - 41 U/L   ALT 24 14 - 54 U/L   Alkaline Phosphatase 113 38 - 126 U/L   Total Bilirubin 1.5 (H) 0.3 - 1.2 mg/dL   Bilirubin, Direct 0.3 0.1 - 0.5 mg/dL   Indirect Bilirubin 1.2 (H) 0.3 - 0.9 mg/dL  Urine microscopic-add on  Result Value Ref Range   Squamous Epithelial / LPF 6-30 (A) NONE SEEN   WBC, UA 6-30 0 - 5 WBC/hpf   RBC / HPF 0-5 0 - 5 RBC/hpf   Bacteria, UA RARE (A) NONE SEEN   Urine-Other YEAST PRESENT   Basic metabolic panel  Result Value Ref Range   Sodium 136 135 - 145 mmol/L   Potassium 4.8 3.5 - 5.1 mmol/L   Chloride 114 (H) 101 - 111 mmol/L   CO2 10 (L) 22 - 32 mmol/L   Glucose, Bld 245 (H) 65 - 99 mg/dL   BUN 18 6 - 20 mg/dL   Creatinine, Ser 1.32 (H) 0.44 - 1.00 mg/dL   Calcium 8.2 (L) 8.9 - 10.3 mg/dL   GFR calc non Af Amer >60 >60 mL/min   GFR calc Af Amer >60 >60 mL/min   Anion gap 12 5 - 15  CBG monitoring, ED  Result Value Ref Range   Glucose-Capillary 454 (H) 65 - 99 mg/dL  I-Stat CG4 Lactic Acid, ED  Result Value Ref Range   Lactic Acid, Venous 5.61 (HH) 0.5 - 2.0 mmol/L   Comment NOTIFIED PHYSICIAN   I-Stat beta hCG blood, ED  Result Value Ref Range   I-stat hCG, quantitative <5.0 <5 mIU/mL   Comment 3          POC CBG, ED  Result Value Ref Range   Glucose-Capillary 480 (H) 65 - 99 mg/dL  CBG monitoring, ED  Result Value Ref Range   Glucose-Capillary 326  (H) 65 - 99 mg/dL  I-Stat venous blood gas, ED  Result Value Ref Range   pH, Ven 7.182 (LL) 7.250 - 7.300   pCO2, Ven 30.8 (L) 45.0 - 50.0 mmHg   pO2, Ven 64.0 (H) 30.0 - 45.0 mmHg   Bicarbonate 11.5 (L) 20.0 - 24.0 mEq/L   TCO2 12 0 - 100 mmol/L   O2 Saturation 87.0 %   Acid-base deficit 16.0 (H) 0.0 - 2.0 mmol/L   Patient temperature 37.0 C    Sample type VENOUS    Comment NOTIFIED PHYSICIAN    Imaging Review Dg Chest Port 1 View  01/17/2016  CLINICAL DATA:  Hyperglycemia today. EXAM: PORTABLE CHEST 1 VIEW COMPARISON:  None. FINDINGS: The cardiomediastinal contours are normal. The lungs are clear. Pulmonary vasculature is normal. No consolidation, pleural effusion, or pneumothorax. No acute osseous abnormalities are seen. IMPRESSION: Normal chest radiograph. Electronically Signed   By: Rubye Oaks M.D.   On: 01/17/2016 03:33     Dione Booze, MD has personally reviewed and evaluated these images and lab results as part of his medical decision-making.  CRITICAL CARE Performed by: Dione Booze Total critical care time: 55 minutes Critical care time was exclusive of separately billable procedures and treating other patients. Critical care was necessary to treat or prevent imminent or life-threatening deterioration. Critical care was time spent personally by me on the following activities: development of treatment plan with patient and/or surrogate as well as  nursing, discussions with consultants, evaluation of patient's response to treatment, examination of patient, obtaining history from patient or surrogate, ordering and performing treatments and interventions, ordering and review of laboratory studies, ordering and review of radiographic studies, pulse oximetry and re-evaluation of patient's condition.  MDM   Final diagnoses:  Type 1 diabetes mellitus with ketoacidosis without coma (HCC)  Elevated lactic acid level  Acute kidney injury (nontraumatic) (HCC)    Hyperglycemia  with vomiting and tachypnea worrisome for ketoacidosis. Old records are reviewed and she is type I diabetic but I cannot find any episodes of ketoacidosis in the record. She started on IV hydration and IV ondansetron. Laboratory workup has come back showing low CO2 with elevated anion gap consistent with ketoacidosis. Lactic acid level is also mildly elevated. This is addressed with the same IV hydration. She is started on intravenous insulin and her is some pop is turned off. Chest x-ray shows no evidence of pneumonia and urinalysis shows no evidence of any tract infection. BUN and creatinine are elevated over baseline-presumably secondary to dehydration. Case is discussed with Dr. Katrinka Blazing of [facility] who agrees to admit the patient.  I personally performed the services described in this documentation, which was scribed in my presence. The recorded information has been reviewed and is accurate.      Dione Booze, MD 01/17/16 0700

## 2016-01-17 NOTE — Discharge Summary (Signed)
Physician Discharge Summary  Jenella Craigie BWI:[id number] DOB: 28-Feb-1996 DOA: 01/17/2016  PCP: Maurine Cane, MD  Admit date: 01/17/2016 Discharge date: 01/17/2016  Time spent: 25 minutes  Recommendations for Outpatient Follow-up:   1. Follow up PCP in 1 weeks  To check CBC   Discharge Diagnoses:  Principal Problem:   DKA, type 1 ([place]) Active Problems:   Presence of insulin pump   Nausea & vomiting   Acute kidney injury ([place])   Yeast infection   Hyperkalemia   Discharge Condition: Stable  Diet recommendation: Diabetic diet   History of present illness:  20 year old female with past medical history of type 1 diabetes on insulin pump; who presents acutely with complaints of abdominal pain with nausea, vomiting, and hyperglycemia. Symptoms initially started 2 days ago with her not being able to get him adequate control of her blood sugars. She notes trying changing the needle site for which her insulin pump administers insulin without relief of symptoms. Blood glucose) the 200s earlier in the week and 400s prior to arrival. Around 10 AM this morning she started developing nausea and vomiting. Associated symptoms included chills and shortness of breath. Patient unable to keep any food down. She notes trying to drink water to keep herself hydrated, but ultimately came into the emergency department for further evaluation. She believes that her meter was working properly otherwise. She also denies any frequency of urination, dysuria, discharge, fever, cough, or headache. Patient denies any previous history of having similar symptoms like this in the past  Hospital Course:  Diabetic ketoacidosis- resolved, AG is 10. Patient presented with AG of 25, she was started on DKA protocol with IV insulin. She was also seen by her pediatric endocrinologist, IV insulin was discontinued and patient was put back on insulin pump. At this time DKA is resolved and she will follow up with her endocrinologist  as outpatient.  Leukocytosis- patient presented with white count of 26,000, UA and chest x-ray did not show any significant abnormality. She has been afebrile. No signs and symptoms of infection. Likely leukocytosis is from dehydration and DKA. Will repeat CBC today as patient wants to go home. If WBC is improving will discharge her with follow-up CBC in 1 week at PCP office.  Procedures:  None   Consultations:  None  Discharge Exam: Filed Vitals:   01/17/16 1100 01/17/16 1130  BP: 108/63 107/61  Pulse: 106 108  Temp:    Resp: 16 15    General: appears in no acute distress Cardiovascular:  S1-S2 regular Respiratory:  Clear to auscultation bilaterally  Discharge Instructions   Discharge Instructions    Diet - low sodium heart healthy    Complete by:  As directed      Increase activity slowly    Complete by:  As directed           Current Discharge Medication List    CONTINUE these medications which have NOT CHANGED   Details  BELVIQ 10 MG TABS Take 1 tablet by mouth 2 (two) times daily. Refills: 0    glucagon 1 MG injection Use for Severe Hypoglycemia . Inject 14m intramuscularly if unresponsive, unable to swallow, unconscious and/or has seizure Qty: 2 kit, Refills: 6   Associated Diagnoses: Uncontrolled type 1 diabetes mellitus with hyperglycemia (HCC)    Insulin Glargine (LANTUS SOLOSTAR) 100 UNIT/ML Solostar Pen Use up to 50 units daily if pump fails. Qty: 5 pen, Refills: 6   Associated Diagnoses: Uncontrolled type 1 diabetes mellitus with hyperglycemia (  HCC)    insulin glulisine (APIDRA) 100 UNIT/ML injection Use 300 units in insulin pump every 48 hours Qty: 20 vial, Refills: 3   Associated Diagnoses: Type I diabetes mellitus, uncontrolled (HCC)    insulin lispro (HUMALOG KWIKPEN) 100 UNIT/ML KiwkPen Up to 50 units/day if pump fails Qty: 5 pen, Refills: 6   Associated Diagnoses: Uncontrolled type 1 diabetes mellitus without complication (HCC)    Multiple  Vitamin (MULTIVITAMIN) tablet Take 1 tablet by mouth daily.      glucose blood (BAYER CONTOUR NEXT TEST) test strip Checks blood sugar 10x daily Qty: 900 each, Refills: 4   Associated Diagnoses: Type I diabetes mellitus, uncontrolled (HCC)    Insulin Pen Needle 31G X 5 MM MISC Check Blood glucose 10x day Qty: 900 each, Refills: 3   Associated Diagnoses: Type I (juvenile type) diabetes mellitus without mention of complication, uncontrolled       No Known Allergies    The results of significant diagnostics from this hospitalization (including imaging, microbiology, ancillary and laboratory) are listed below for reference.    Significant Diagnostic Studies: Dg Chest Port 1 View  01/17/2016  CLINICAL DATA:  Hyperglycemia today. EXAM: PORTABLE CHEST 1 VIEW COMPARISON:  None. FINDINGS: The cardiomediastinal contours are normal. The lungs are clear. Pulmonary vasculature is normal. No consolidation, pleural effusion, or pneumothorax. No acute osseous abnormalities are seen. IMPRESSION: Normal chest radiograph. Electronically Signed   By: Jeb Levering M.D.   On: 01/17/2016 03:33    Microbiology: No results found for this or any previous visit (from the past 240 hour(s)).   Labs: Basic Metabolic Panel:  Recent Labs Lab 01/17/16 0245 01/17/16 0551 01/17/16 0851 01/17/16 1227 01/17/16 1654  NA 135 136 135 141 137  K 5.2* 4.8 4.3 3.9 4.0  CL 100* 114* 110 113* 111  CO2 9* 10* 15* 17* 15*  GLUCOSE 511* 245* 164* 191* 286*  BUN 20 18 14 12 12   CREATININE 1.52* 1.04* 0.81 0.64 0.76  CALCIUM 9.6 8.2* 8.2* 8.2* 8.1*   Liver Function Tests:  Recent Labs Lab 01/17/16 0245  AST 34  ALT 24  ALKPHOS 113  BILITOT 1.5*  PROT 8.8*  ALBUMIN 4.8    Recent Labs Lab 01/17/16 0245  LIPASE 26   CBC:  Recent Labs Lab 01/17/16 0245  WBC 26.4*  NEUTROABS 22.2*  HGB 15.9*  HCT 46.5*  MCV 88.4  PLT 352    CBG:  Recent Labs Lab 01/17/16 0849 01/17/16 0953  01/17/16 1040 01/17/16 1153 01/17/16 1251  GLUCAP 149* 192* 183* 181* 172*       Signed:  Eleonore Chiquito S MD.  [facility] 01/17/2016, 6:27 PM

## 2016-01-17 NOTE — ED Notes (Signed)
MD lama texted paged, " [name], Room A2 Endocrinologist advised she would rather have insulin given via her pump then sliding scale. The Endocrinologist is at bedside with the patient now and putting her back on her pump.  Thank you,  Jesse Sans RN "

## 2016-01-17 NOTE — ED Notes (Signed)
MD [place] called advised he placed sliding scale and to be given right now.

## 2016-01-17 NOTE — ED Notes (Signed)
MD [place] text paged. " Paula Miller, Room A2, She ask to start CBG with her own meter Endocrinologist advised it was okay. The patient took her CBG and it was 321. The Endocrinologist  took her pump and advised she download the information and bring it back. "

## 2016-01-17 NOTE — ED Notes (Signed)
Endocrinologist at bedside. She advised okay for her to check CBG on her on meter.

## 2016-01-17 NOTE — ED Notes (Signed)
Patient checked her BS with her Endocrinologist 238 she gave the patient 2.05 units of insulin for correction.

## 2016-01-17 NOTE — Plan of Care (Signed)
After discussion with the family, the hospitalist team decided to repeat Graycie's CBC tonight to see if WBC had improved.  Repeat stat CBC showed WBC decreased to 12.8 so she was discharged home.  I spoke with Micaila's mother and father; they both have my cell phone number and I recommended contacting me should they have any concerns.  I reviewed our "Outpatient Treatment for DKA" protocol with Claris Che and her father (including checking BGs every 3 hours, giving correction for these, checking ketones each void, increasing fluid intake to flush out ketones).  Advised to contact me tomorrow afternoon to update me on how she is doing (or sooner if concerns).

## 2016-01-17 NOTE — ED Notes (Signed)
Pt ambulated independently to the restroom.

## 2016-01-17 NOTE — ED Notes (Signed)
MD paged advised patient is 2 hours post Lantus and Insulin Drip has been D/C. Family wants to know when to place patient back on insulin pump. MD called and advised Endocrinologist  will come see patient and let them know when its okay restart her insulin pump.

## 2016-01-17 NOTE — ED Notes (Signed)
Patient eating dinner. Patient dad states he will calculate the carbs and give correction dose.

## 2016-01-17 NOTE — ED Notes (Signed)
Patient family request to now check patient CBG with their meter. Patient family advised RN needs to present during check.

## 2016-01-17 NOTE — Plan of Care (Signed)
BMP reviewed; anion gap closed though CO2 still low at 15.  WBC on admission also elevated to 26.2 without source of infection. Case discussed with Dr. Sharl Ma- given concern over elevated WBC, agree with admission overnight.    -Would recommend checking BG q3-4 hours overnight and giving correction via insulin pump. -Recommend covering all carbohydrates consumed with insulin per pump.   -Recommend continuing non-dextrose IV fluids overnight  -Check urine ketones with each void -Repeat BMP in 12 hours to ensure CO2 is improving

## 2016-01-17 NOTE — ED Notes (Signed)
CBG was 480

## 2016-01-17 NOTE — ED Notes (Signed)
Spoke with [place] MD via telephone, verbal order to DC based on latest CBC.

## 2016-01-17 NOTE — ED Notes (Signed)
Spoke with admitting and he recommended d/c glucostabilizer after 3 CBG <250 mg/dL.

## 2016-01-17 NOTE — ED Notes (Signed)
Patient Paula Miller with her meter 312. Patient states she does not want RN to check with our meter to see if a difference because patient states, " I am buried."

## 2016-01-17 NOTE — Plan of Care (Signed)
I witnessed Paula Miller use her home glucometer to check BG at 5:15PM.  BG was 238.  She was given 2.05 units for correction via her insulin pump.    I provided the family with a copy of our "home DKA management" plan to follow.  I also provided my contact information and advised that she contact me tomorrow afternoon for an update or sooner with any questions.  Will await results of most recent blood work to determine if she can be discharged tonight.

## 2016-01-17 NOTE — ED Notes (Signed)
Pt states that she is a type 1 diabetic and her sugars have been in the 400's since yesterday morning. Also c/o emesis.

## 2016-01-18 ENCOUNTER — Telehealth: Payer: Self-pay | Admitting: Pediatrics

## 2016-01-18 NOTE — Telephone Encounter (Signed)
LATE ENTRY:  Received call today from Paula Miller at 2:30PM.  She reports she is doing well and feeling better.  She continues to drink a lot and urine ketones are decreasing (most recent check was trace or small).  BGs have been 140, 150, 92.  She denies nausea or vomiting.  Both she and her mother had a hard time connecting the mio tubing to the set so she is going to change her site to a quickset before going back to school this evening.   I recommended that she continue to drink plenty of fluids, check ketones with each void until negative.  Advised her to contact me with any questions or concerns.

## 2016-01-19 LAB — HEMOGLOBIN A1C
HEMOGLOBIN A1C: 11.2 % — AB (ref 4.8–5.6)
MEAN PLASMA GLUCOSE: 275 mg/dL

## 2016-01-22 ENCOUNTER — Encounter: Payer: Self-pay | Admitting: *Deleted

## 2016-01-23 ENCOUNTER — Telehealth: Payer: Self-pay | Admitting: Pediatrics

## 2016-01-23 NOTE — Telephone Encounter (Signed)
Routed to provider

## 2016-01-23 NOTE — Telephone Encounter (Signed)
Called Kolina's mother back- she received a letter from the hospital stating that [name] had kidney damage and yeast in her urine from her recent ED visit/hospitalization.  Discussed with mother that creatinine was elevated on admission though improved to normal with hydration (this is common with DKA).  Discussed that we will send her for her annual urine microalbumin to creatinine ratio at her next visit to see if she is spilling protein in her urine related to diabetes.  Also discussed that yeast in her urine may be a sign of vaginal yeast infection.  Reviewed symptoms with mother; she will check with [name] to see if she has symptoms of a vaginal yeast infection.  If so, I recommended an OTC treatment such as monostat.

## 2016-02-10 ENCOUNTER — Encounter: Payer: Self-pay | Admitting: Family

## 2016-02-10 ENCOUNTER — Ambulatory Visit (INDEPENDENT_AMBULATORY_CARE_PROVIDER_SITE_OTHER): Payer: 59 | Admitting: Family

## 2016-02-10 VITALS — BP 121/77 | HR 119 | Wt 127.2 lb

## 2016-02-10 DIAGNOSIS — E1065 Type 1 diabetes mellitus with hyperglycemia: Principal | ICD-10-CM

## 2016-02-10 DIAGNOSIS — E109 Type 1 diabetes mellitus without complications: Secondary | ICD-10-CM | POA: Diagnosis not present

## 2016-02-10 DIAGNOSIS — IMO0001 Reserved for inherently not codable concepts without codable children: Secondary | ICD-10-CM

## 2016-02-10 DIAGNOSIS — Z4681 Encounter for fitting and adjustment of insulin pump: Secondary | ICD-10-CM | POA: Diagnosis not present

## 2016-02-10 DIAGNOSIS — R000 Tachycardia, unspecified: Secondary | ICD-10-CM

## 2016-02-10 LAB — POCT GLYCOSYLATED HEMOGLOBIN (HGB A1C): HEMOGLOBIN A1C: 10.2

## 2016-02-10 LAB — GLUCOSE, POCT (MANUAL RESULT ENTRY): POC GLUCOSE: 194 mg/dL — AB (ref 70–99)

## 2016-02-10 NOTE — Progress Notes (Signed)
Subjective:  Patient Name: Paula Miller Date of Birth: Mar 22, 1996  MRN: [id number]  Paula Miller  presents to the office today for follow-up evaluation and management of her type 1 diabetes on insulin pump, hypoglycemia, hypoglycemic unawareness, autonomic neuropathy, inappropriate sinus tachycardia, and goiter  HISTORY OF PRESENT ILLNESS:   Paula Miller is a 20 y.o. Caucasian young lady.  Paula Miller was unaccompanied.  1. Paula Miller was diagnosed with new onset type 1 diabetes mellitus, dehydration, and ketonuria on 06/09/2005. She was initially treated with a multiple daily injection regimen of Lantus and Novolog insulins.  We converted her to a Medtronic insulin pump in November of 2007.  Although the patient does have a palpable goiter, she has remained euthyroid. Her CMPs, lipid panels, and urinary microalbumin to creatinine ratios have always been normal.   2. The patient's last [facility] visit was on 05/21/15. In the interim, she has been generally healthy.   [name] feels that she is doing better since her admission to the hospital. She was placed on an insulin drip over night after being in DKA when she had a site failure at home. She states that since that time she has been working harder to control her blood sugars and use her pump properly. Her blood sugars tend to run higher while she is at school because of stress, now that she is on spring break, her blood sugars have been better. She still tries to not let people know that she has diabetes. She does not use the temp basal on her pump during stress/menses due to the beeping that the pump occasionally makes. 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      3. Pertinent Review of Systems:  Constitutional: The patient feels "pretty good". She is generally healthy.  Eyes: Vision is good with her glasses. Her most recent eye exam was December 2015. There were no signs of diabetic eye disease. There are no other recognized eye  problems.  Neck: The patient has no complaints of anterior neck swelling, soreness, tenderness, pressure, discomfort, or difficulty swallowing.   Heart: Heart rate increases with exercise or other physical activity. The patient has no complaints of palpitations, irregular heart beats, chest pain, or chest pressure.   Gastrointestinal: Bowel movents seem normal. The patient has no complaints of excessive hunger, acid reflux, upset stomach, stomach aches or pains, diarrhea, or constipation.  Legs: Muscle mass and strength seem normal. There are no complaints of numbness, tingling, burning, or pain. No edema is noted.  Feet: There are no obvious foot problems. There are no complaints of numbness, tingling, burning, or pain. No edema is noted. Neurologic: There are no recognized problems with muscle movement and strength, sensation, or coordination. Hypoglycemia: rare  [email or web address]  Diabetes ID: Wearing bracelet. Annual labs: Due December 2016. Orders placed   4. BG printout: Checking Bg 5.1 times per day. Avg Bg 263 +/- 130. She is using about 75% basal. She has two days with no boluses.  Last visit: Checking BG 2.4 times per day. 1 day of missed checks. Avg Bg 258 +- 112. She changes her site every 3-5 days. Only bolusing 1.6 times per day. 85% of insulin is basal.    PAST MEDICAL, FAMILY, AND SOCIAL HISTORY  Past Medical History  Diagnosis Date  . Type 1 diabetes mellitus in patient age 63-19 years with HbA1C goal below 7.5   . Hypoglycemia associated with diabetes ([place])   . Goiter   . Physical growth delay   . Allergy  seasonal allergies  . VSD (ventricular septal defect)     Family History  Problem Relation Age of Onset  . Diabetes Mother   . Allergies Mother   . Thyroid disease Mother   . Thyroid disease Maternal Grandmother   . Arthritis Maternal Grandfather   . Arthritis Paternal Grandmother   . Heart attack Paternal Grandfather      Current outpatient  prescriptions:  .  BELVIQ 10 MG TABS, Take 1 tablet by mouth 2 (two) times daily., Disp: , Rfl: 0 .  glucagon 1 MG injection, Use for Severe Hypoglycemia . Inject 71m intramuscularly if unresponsive, unable to swallow, unconscious and/or has seizure, Disp: 2 kit, Rfl: 6 .  glucose blood (BAYER CONTOUR NEXT TEST) test strip, Checks blood sugar 10x daily, Disp: 900 each, Rfl: 4 .  Insulin Glargine (LANTUS SOLOSTAR) 100 UNIT/ML Solostar Pen, Use up to 50 units daily if pump fails., Disp: 5 pen, Rfl: 6 .  insulin glulisine (APIDRA) 100 UNIT/ML injection, Use 300 units in insulin pump every 48 hours, Disp: 20 vial, Rfl: 3 .  insulin lispro (HUMALOG KWIKPEN) 100 UNIT/ML KiwkPen, Up to 50 units/day if pump fails, Disp: 5 pen, Rfl: 6 .  Insulin Pen Needle 31G X 5 MM MISC, Check Blood glucose 10x day, Disp: 900 each, Rfl: 3 .  Multiple Vitamin (MULTIVITAMIN) tablet, Take 1 tablet by mouth daily.  , Disp: , Rfl:   Allergies as of 02/10/2016  . (No Known Allergies)     reports that she has never smoked. She has never used smokeless tobacco. She reports that she does not drink alcohol or use illicit drugs. Pediatric History  Patient Guardian Status  . Father:  TParveen, Freehling  Other Topics Concern  . 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  Activities: Running and exercising regularly. Denies any alcohol use.  Primary Care Provider: DMaurine Cane MD  REVIEW OF SYSTEMS: There are no other significant problems involving Paula Miller's other body systems.   Objective:  Vital Signs:  BP 121/77 mmHg  Pulse 119  Wt 57.698 kg (127 lb 3.2 oz)  LMP 01/10/2016    Ht Readings from Last 3 Encounters:  09/03/14 5' 3.94" (1.624 m) (46 %*, Z = -0.11)  05/02/14 5' 3.94" (1.624 m) (46 %*, Z = -0.10)  11/12/13 5' 3.78" (1.62 m) (44 %*, Z = -0.15)   * Growth percentiles are based on CDC 2-20 Years data.   Wt Readings from Last 3  Encounters:  02/10/16 57.698 kg (127 lb 3.2 oz) (50 %*, Z = -0.01)  11/18/15 60.056 kg (132 lb 6.4 oz) (60 %*, Z = 0.25)  10/14/15 62.097 kg (136 lb 14.4 oz) (67 %*, Z = 0.45)   * Growth percentiles are based on CDC 2-20 Years data.   HC Readings from Last 3 Encounters:  [facility]  There is no height on file to calculate BSA. No height on file for this encounter. 50%ile (Z=-0.01) based on CDC 2-20 Years weight-for-age data using vitals from 02/10/2016.  PHYSICAL EXAM:  Constitutional: The patient appears healthy.  The patient's height and weight are normal for age.  Head: The head is normocephalic. Face: The face appears normal. There are no obvious dysmorphic features. Eyes: The eyes appear to be normally formed and spaced. Gaze is conjugate. There is no obvious arcus or proptosis. Moisture appears normal. Mouth: The oropharynx appears normal.  Oral moisture is normal. Neck: The neck appears to be visibly normal. The thyroid gland is normal for age. The consistency is normal. The thyroid gland is not tender to palpation. Lungs: The lungs are clear to auscultation. Air movement is good. Heart: Heart rate and rhythm are regular. Heart sounds S1 and S2 are normal. I did not appreciate any pathologic cardiac murmurs. Abdomen: The abdomen is normal in size for the patient's age. Bowel sounds are normal. There is no obvious hepatomegaly, splenomegaly, or other mass effect.  Arms: Muscle size and bulk are normal for age. Hands: There is no obvious tremor. Phalangeal and metacarpophalangeal joints are normal. Palmar muscles are normal for age. Palmar skin is normal. Palmar moisture is also normal. Legs: Muscles appear normal for age. No edema is present. Feet: Feet are normally formed. Dorsalis pedal pulses are normal 1+ bilaterally. Neurologic: Strength is normal for age in both the upper and lower extremities. Muscle tone is normal. Sensation to touch is normal in both the legs and  feet.    LAB DATA:   Results for orders placed or performed in visit on 02/10/16 (from the past 504 hour(s))  POCT Glucose (CBG)   Collection Time: 02/10/16 10:37 AM  Result Value Ref Range   POC Glucose 194 (A) 70 - 99 mg/dl  POCT HgB A1C   Collection Time: 02/10/16 10:46 AM  Result Value Ref Range   Hemoglobin A1C 10.2       Assessment and Plan:   ASSESSMENT:  1. Type 1 diabetes: Continues to have sub-optimal diabetes care. Her A1C has improved since her last visit and she is checking and bolusing more often, however, she continues to need improvement. She struggles when she is at school with stress and when she is having menses.  2. Hypoglycemia: rare. Most lows occur when she sleeps late into the day.  3. Weight- 5 pound weight loss. States she is exercising more.  4. Autonomic neuropathy and inappropriate sinus tachycardia: These problems will reverse with better BG control.  PLAN:  1. Diagnostic: Blood sugar and A1c as above.  2. Therapeutic:  Pump setting changes as below.  12am: 0.650--> 0.675 4am: 0.750 630am: 1.45 10am: 1.85 9pm: 1.45--> 1.50 3. Patient education: Reviewed pump download and titrated insulin doses. Discussed changing pump sites more frequently and using other areas of the body other then her hips to scar tissue. Discussed importance of performing diabetes care, even when in public. Discussed putting pump on vibrate mode and using temp basal of 120% when on menses or during stressful times. Discussed bolusing with all food.  4. Follow-up: 2 months   Level of Service: This visit lasted in excess of 25 minutes. More than 50% of the visit was devoted to counseling.    Hermenia Bers, FNP-C

## 2016-02-10 NOTE — Patient Instructions (Addendum)
Basal Changes  12am: 0.65--> 0.675 9pm: 1.45--> 1.50  - Test blood sugar at least four times a day  - Bolus when you are eating late at night - If you think you have a bad site. Give insulin injection to bring down blood sugar, change site and monitor.  - For stress/cycle, do temp basal of 120%. You can set your alarms to vibrate.

## 2016-04-13 ENCOUNTER — Encounter: Payer: Self-pay | Admitting: Family

## 2016-04-13 ENCOUNTER — Ambulatory Visit (INDEPENDENT_AMBULATORY_CARE_PROVIDER_SITE_OTHER): Payer: 59 | Admitting: Family

## 2016-04-13 ENCOUNTER — Encounter: Payer: Self-pay | Admitting: *Deleted

## 2016-04-13 VITALS — BP 123/82 | HR 106 | Wt 127.2 lb

## 2016-04-13 DIAGNOSIS — E109 Type 1 diabetes mellitus without complications: Secondary | ICD-10-CM | POA: Diagnosis not present

## 2016-04-13 DIAGNOSIS — Z4681 Encounter for fitting and adjustment of insulin pump: Secondary | ICD-10-CM | POA: Diagnosis not present

## 2016-04-13 DIAGNOSIS — R000 Tachycardia, unspecified: Secondary | ICD-10-CM

## 2016-04-13 DIAGNOSIS — IMO0001 Reserved for inherently not codable concepts without codable children: Secondary | ICD-10-CM

## 2016-04-13 DIAGNOSIS — E1065 Type 1 diabetes mellitus with hyperglycemia: Principal | ICD-10-CM

## 2016-04-13 DIAGNOSIS — G43009 Migraine without aura, not intractable, without status migrainosus: Secondary | ICD-10-CM | POA: Diagnosis not present

## 2016-04-13 LAB — GLUCOSE, POCT (MANUAL RESULT ENTRY): POC GLUCOSE: 195 mg/dL — AB (ref 70–99)

## 2016-04-13 LAB — POCT GLYCOSYLATED HEMOGLOBIN (HGB A1C): HEMOGLOBIN A1C: 9.7

## 2016-04-13 NOTE — Patient Instructions (Signed)
Basal Changes  12am: 0.675 4am:0.75-->  0.725 630am: 1.5--> 1.4 10am: 1.85--> 1.75 9pm: 1.45--> 1.4  We are reducing your basal rates to allow you to bolus without going low. It is very important that you bolus for all meals and to correct for high blood sugars. You will not be able to depend on your bolus as your main means of insulin delivery. If you are running high or having lows please call the office or send me a MyChart message.   In one week send 2-3 days worth of blood sugars so I can review them. You can use Mychart.   Follow up in 3 months.

## 2016-04-13 NOTE — Progress Notes (Signed)
Subjective:  Patient Name: Paula Miller Date of Birth: 04/16/1996  MRN: [id number]  Zaire Vanbuskirk  presents to the office today for follow-up evaluation and management of her type 1 diabetes on insulin pump, hypoglycemia, hypoglycemic unawareness, autonomic neuropathy, inappropriate sinus tachycardia, and goiter  HISTORY OF PRESENT ILLNESS:   Paula Miller is a 20 y.o. Caucasian young lady.  Paula Miller was unaccompanied.  1. Paula Miller was diagnosed with new onset type 1 diabetes mellitus, dehydration, and ketonuria on 06/09/2005. She was initially treated with a multiple daily injection regimen of Lantus and Novolog insulins.  We converted her to a Medtronic insulin pump in November of 2007.  Although the patient does have a palpable goiter, she has remained euthyroid. Her CMPs, lipid panels, and urinary microalbumin to creatinine ratios have always been normal.   2. The patient's last [facility] visit was on 05/21/15. In the interim, she has been generally healthy. She reports that she feels like things are going better. She is checking her blood sugar more but admits that she is still not great about bolusing as often as she should. She states that she feels better overall as her diabetes improved. 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 She is not interested in using a CGM sensor at this point but will consider it in the future.   She does complain that she has been having frequent Migraine headaches over the last month despite better blood sugar control. She was followed by Neurology in the past for Migraine's but has not been seen in a long time. She reports she is having headaches about once per week, they last "the whole day". She is sensitive to sound but not light and denies aura.     3. Pertinent Review of Systems:  Constitutional: The patient feels "pretty good". She is generally healthy.  Eyes: Vision is good with her  glasses. Her most recent eye exam was December 2016. There were no signs of diabetic eye disease. There are no other recognized eye problems.  Neck: The patient has no complaints of anterior neck swelling, soreness, tenderness, pressure, discomfort, or difficulty swallowing.   Heart: Heart rate increases with exercise or other physical activity. The patient has no complaints of palpitations, irregular heart beats, chest pain, or chest pressure.   Gastrointestinal: Bowel movents seem normal. The patient has no complaints of excessive hunger, acid reflux, upset stomach, stomach aches or pains, diarrhea, or constipation.  Legs: Muscle mass and strength seem normal. There are no complaints of numbness, tingling, burning, or pain. No edema is noted.  Feet: There are no obvious foot problems. There are no complaints of numbness, tingling, burning, or pain. No edema is noted. Neurologic: There are no recognized problems with muscle movement and strength, sensation, or coordination. Hypoglycemia: rare  [email or web address]  Diabetes ID: Wearing bracelet. Annual labs: Due December 2016. Orders placed   4. BG printout: Checking Bg 4.4 times per day. Avg Bg 204. She is using 82% basal on average. She has no days without blood sugar checks and only one day without a bolus.  Checking Bg 5.1 times per day. Avg Bg 263 +/- 130. She is using about 75% basal. She has two days with no boluses.     PAST MEDICAL, FAMILY, AND SOCIAL HISTORY  Past Medical History  Diagnosis Date  . Type 1 diabetes mellitus in patient age 53-19 years with HbA1C goal below 7.5   . Hypoglycemia  associated with diabetes ([place])   . Goiter   . Physical growth delay   . Allergy     seasonal allergies  . VSD (ventricular septal defect)     Family History  Problem Relation Age of Onset  . Diabetes Mother   . Allergies Mother   . Thyroid disease Mother   . Thyroid disease Maternal Grandmother   . Arthritis Maternal Grandfather    . Arthritis Paternal Grandmother   . Heart attack Paternal Grandfather      Current outpatient prescriptions:  .  BELVIQ 10 MG TABS, Take 1 tablet by mouth 2 (two) times daily., Disp: , Rfl: 0 .  glucagon 1 MG injection, Use for Severe Hypoglycemia . Inject 49m intramuscularly if unresponsive, unable to swallow, unconscious and/or has seizure, Disp: 2 kit, Rfl: 6 .  glucose blood (BAYER CONTOUR NEXT TEST) test strip, Checks blood sugar 10x daily, Disp: 900 each, Rfl: 4 .  insulin glulisine (APIDRA) 100 UNIT/ML injection, Use 300 units in insulin pump every 48 hours, Disp: 20 vial, Rfl: 3 .  Multiple Vitamin (MULTIVITAMIN) tablet, Take 1 tablet by mouth daily.  , Disp: , Rfl:  .  Insulin Glargine (LANTUS SOLOSTAR) 100 UNIT/ML Solostar Pen, Use up to 50 units daily if pump fails. (Patient not taking: Reported on 04/13/2016), Disp: 5 pen, Rfl: 6 .  insulin lispro (HUMALOG KWIKPEN) 100 UNIT/ML KiwkPen, Up to 50 units/day if pump fails (Patient not taking: Reported on 04/13/2016), Disp: 5 pen, Rfl: 6 .  Insulin Pen Needle 31G X 5 MM MISC, Check Blood glucose 10x day (Patient not taking: Reported on 04/13/2016), Disp: 900 each, Rfl: 3  Allergies as of 04/13/2016  . (No Known Allergies)     reports that she has never smoked. She has never used smokeless tobacco. She reports that she does not drink alcohol or use illicit drugs. Pediatric History  Patient Guardian Status  . Father:  TBeryle, B[place]by  Other Topics Concern  . 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  Activities: Running and exercising regularly. Denies any alcohol use.  Primary Care Provider: DMaurine Cane MD  REVIEW OF SYSTEMS: There are no other significant problems involving Paula Miller's other body systems.   Objective:  Vital Signs:  BP 123/82 mmHg  Pulse 106  Wt 127 lb 3.2 oz (57.698 kg)    Ht Readings from Last 3 Encounters:  09/03/14 5'  3.94" (1.624 m) (46 %*, Z = -0.11)  05/02/14 5' 3.94" (1.624 m) (46 %*, Z = -0.10)  11/12/13 5' 3.78" (1.62 m) (44 %*, Z = -0.15)   * Growth percentiles are based on CDC 2-20 Years data.   Wt Readings from Last 3 Encounters:  04/13/16 127 lb 3.2 oz (57.698 kg) (49 %*, Z = -0.03)  02/10/16 127 lb 3.2 oz (57.698 kg) (50 %*, Z = -0.01)  11/18/15 132 lb 6.4 oz (60.056 kg) (60 %*, Z = 0.25)   * Growth percentiles are based on CDC 2-20 Years data.   HC Readings from Last 3 Encounters:  [facility]  There is no height on file to calculate BSA. No height on file for this encounter. 49%ile (Z=-0.03) based on CDC 2-20 Years weight-for-age data using vitals from 04/13/2016.  PHYSICAL EXAM:  Constitutional: The patient appears healthy.  The patient's height and weight are normal for age.  Head: The head is normocephalic. Face: The face  appears normal. There are no obvious dysmorphic features. Eyes: The eyes appear to be normally formed and spaced. Gaze is conjugate. There is no obvious arcus or proptosis. Moisture appears normal. Mouth: The oropharynx appears normal.  Oral moisture is normal. Neck: The neck appears to be visibly normal. The thyroid gland is normal for age. The consistency is normal. The thyroid gland is not tender to palpation. Lungs: The lungs are clear to auscultation. Air movement is good. Heart: Heart rate and rhythm are regular. Heart sounds S1 and S2 are normal. I did not appreciate any pathologic cardiac murmurs. Abdomen: The abdomen is normal in size for the patient's age. Bowel sounds are normal. There is no obvious hepatomegaly, splenomegaly, or other mass effect.  Arms: Muscle size and bulk are normal for age. Hands: There is no obvious tremor. Phalangeal and metacarpophalangeal joints are normal. Palmar muscles are normal for age. Palmar skin is normal. Palmar moisture is also normal. Legs: Muscles appear normal for age. No edema is present. Feet: Feet are  normally formed. Dorsalis pedal pulses are normal 1+ bilaterally. Neurologic: Strength is normal for age in both the upper and lower extremities. Muscle tone is normal. Sensation to touch is normal in both the legs and feet.    LAB DATA:   Results for orders placed or performed in visit on 04/13/16 (from the past 504 hour(s))  POCT Glucose (CBG)   Collection Time: 04/13/16  9:11 AM  Result Value Ref Range   POC Glucose 195 (A) 70 - 99 mg/dl  POCT HgB A1C   Collection Time: 04/13/16  9:17 AM  Result Value Ref Range   Hemoglobin A1C 9.7       Assessment and Plan:   ASSESSMENT:  1. Type 1 diabetes: Sub-optimal but improving control. She is doing better checking her blood sugars and giving insulin, her A1C has gotten better as a result. She is relying heavily on basal.  2. Hypoglycemia: rare. Most lows occur when she sleeps late into the day.  3. Weight- Stable since last visit.  4. Autonomic neuropathy and inappropriate sinus tachycardia: These problems will reverse with better BG control. 5. Migraine Headaches: Worse with stress. Currently taking a medicine given to her by the [facility], she is unsure of name. She is starting with a new family practice physician this month.   PLAN:  1. Diagnostic: Blood sugar and A1c as above.  2. Therapeutic:  Pump setting changes as below.  Basal Changes   12am: 0.675  4am:0.75-->  0.725  630am: 1.5--> 1.4  10am: 1.85--> 1.75  9pm: 1.45--> 1.4 3. Patient education: Reviewed pump download and titrated insulin doses. Discussed changing pump sites more frequently and using other areas of the body other then her hips to scar tissue. Discussed importance of performing diabetes care, even when in public. Discussed that using mainly basal can be dangerous. We are decreasing basal rates and encouraging Paula Miller to bolus more often. She feels open to this idea.Paula Miller will follow up with Family MD about Migraines. Discussed headache journal and  good hydration in additional to medication.  4. Follow-up: 3 months. Will Mychart message me with blood sugar in 1 week.    Level of Service: This visit lasted in excess of 40 minutes. More than 50% of the visit was devoted to counseling.    Hermenia Bers, FNP-C

## 2016-04-20 ENCOUNTER — Encounter: Payer: Self-pay | Admitting: Family

## 2016-04-22 ENCOUNTER — Ambulatory Visit (INDEPENDENT_AMBULATORY_CARE_PROVIDER_SITE_OTHER): Payer: 59 | Admitting: Family Medicine

## 2016-04-22 ENCOUNTER — Encounter: Payer: Self-pay | Admitting: Family Medicine

## 2016-04-22 ENCOUNTER — Other Ambulatory Visit: Payer: Self-pay | Admitting: Family Medicine

## 2016-04-22 VITALS — BP 110/68 | HR 92 | Ht 64.0 in | Wt 126.8 lb

## 2016-04-22 DIAGNOSIS — R112 Nausea with vomiting, unspecified: Secondary | ICD-10-CM | POA: Diagnosis not present

## 2016-04-22 DIAGNOSIS — E1065 Type 1 diabetes mellitus with hyperglycemia: Secondary | ICD-10-CM

## 2016-04-22 DIAGNOSIS — Z113 Encounter for screening for infections with a predominantly sexual mode of transmission: Secondary | ICD-10-CM | POA: Diagnosis not present

## 2016-04-22 DIAGNOSIS — IMO0002 Reserved for concepts with insufficient information to code with codable children: Secondary | ICD-10-CM

## 2016-04-22 DIAGNOSIS — G43009 Migraine without aura, not intractable, without status migrainosus: Secondary | ICD-10-CM | POA: Diagnosis not present

## 2016-04-22 DIAGNOSIS — Z0000 Encounter for general adult medical examination without abnormal findings: Secondary | ICD-10-CM | POA: Diagnosis not present

## 2016-04-22 DIAGNOSIS — E1043 Type 1 diabetes mellitus with diabetic autonomic (poly)neuropathy: Secondary | ICD-10-CM

## 2016-04-22 LAB — POCT URINALYSIS DIPSTICK
Bilirubin, UA: NEGATIVE
Leukocytes, UA: NEGATIVE
Nitrite, UA: NEGATIVE
PH UA: 6
PROTEIN UA: NEGATIVE
SPEC GRAV UA: 1.025
UROBILINOGEN UA: NEGATIVE

## 2016-04-22 MED ORDER — ONDANSETRON 4 MG PO TBDP: 4.0000 mg | ORAL_TABLET | Freq: Three times a day (TID) | ORAL | Status: DC | PRN

## 2016-04-22 NOTE — Progress Notes (Signed)
Chief Complaint  Patient presents with  . Annual Exam    new patient nonfasting annual exam. Did not do eye exam, sees Dr.Young yearly and would prefer to do there. Does want to discuss her tonsils-feels like they become inflammed a lot. Has also been having frequent migraines.    Paula Miller is a 20 y.o. female who presents for a complete physical, and to establish care.  She is switching care here from pediatrician (Dr. Albertina Parr). She has type 1 diabetes, and was recently seen by peds-endo--A1c was 9.7. She has some tachycardia related to autonomic neuropathy, which is expected to improve as sugars improve, per endo's notes.  She has the following concerns:  Tonsils--whenever she gets a mild cold, her throat hurts and she notices that her tonsils swell a lot.  Gets colds about 3x/year, usually requires taking advil to take pain and swelling down. Denies snoring, sleep apnea, trouble breathing or swallowing, or frequent strep throat.  Headaches:  She had migraines in elementary and middle school.  Never was on a preventative medication.  Headaches had improved, but over the last semester she noticed increase frequency of migraines, associated with some nausea.  She was recently prescribed fioricet by her GYN for these headaches.  Took it only once since prescribed.  Unsure if the medication caused nausea, or just progression of the headache.  Headaches last 1 day. +sound sensitivity, no light sensitivity, some nausea and vomiting. Denies any warning or aura.  She often gets "small headaches", and can't tell when these will progress to a full migraine, so doesn't always take a medication early on.   Hasn't identified triggers for migraines.  Sometimes vision changes might trigger (when switches between glasses and contacts).  Not related to menses. Gets 3-4 migraines per month.  She is sexually active, with 1 partner.  Previously tried [employer], during senior year of high school [phone]). She gained about  20# during that time, didn't like how she felt on it.  Took OCP's for about a year.  GYN (at [facility]) has prescribed Belviq to help with the weight loss. Taking since January. This has helped with craving for sweets.  She sees GYN every 4-5 months for f/u on this medication.  She doesn't think any STD testing has ever been performed through GYN.  Immunization History  Administered Date(s) Administered  . DTaP 10/23/1996, 12/25/1996, 02/26/1997, 03/12/1998, 05/10/2001  . Hepatitis A 01/24/2006, 03/10/2007  . Hepatitis B 10/23/1996, 01/04/1997, 06/09/1997  . HiB (PRP-OMP) 10/23/1996, 12/25/1996, 02/26/1997, 12/11/1997  . IPV 10/23/1996, 12/25/1996, 03/12/1998, 05/10/2001  . Influenza,inj,Quad PF,36+ Mos 08/08/2013  . Influenza-Unspecified 09/19/2002, 09/15/2005, 09/13/2008, 07/15/2011  . MMR 12/11/1997, 05/10/2001  . Meningococcal Polysaccharide 02/13/2009, 05/24/2014  . Tdap 05/10/2007  . Varicella 09/16/1997  gets yearly flu shots (this past year was received at [facility]) Hasn't had HPV vaccines (mother didn't want her to get at one point). NCIR was reviewed, and Varicella was marked as "immune"--plan to check with pediatrician to see if she had titers drawn, vs had illness vs 2nd vaccine. Last Pap smear: never Dentist: twice yearly Ophtho: yearly, last 10/2015 (Dr. Annamaria Boots) Exercise:  Walks on campus a lot (for her job, and classes); no regular cardio or weight-bearing exercise Milk:  Lactose intolerant.  Uses lactaid in her cereal. She takes calcium supplement once daily, plus MVI.  Past Medical History  Diagnosis Date  . Type 1 diabetes mellitus in patient age 15-19 years with HbA1C goal below 7.5 diagnosed age 57  .  Hypoglycemia associated with diabetes ([place])   . Goiter   . Physical growth delay   . Allergy     seasonal allergies  . VSD (ventricular septal defect)     Past Surgical History  Procedure Laterality Date  . Frenulectomy, lingual    . Periodontal  surgery      Social History   Social History  . Marital Status: Single    Spouse Name: N/A  . Number of Children: N/A  . Years of Education: N/A   Occupational History  . student    Social History Main Topics  . Smoking status: Never Smoker   . Smokeless tobacco: Never Used  . Alcohol Use: No  . Drug Use: No  . Sexual Activity:    Partners: Male    Birth Control/ Protection: Condom   Other Topics Concern  . Not on file   Social History Narrative   Ship broker at [employer], [place] [employer]. Will be living in house on campus.   Parents live in [place], 1 younger brother. (mother--[name] Nida, grandparents are the [place])   1 dog at home   Works in [employer] office at [employer] (summer and during school year)    Family History  Problem Relation Age of Onset  . Allergies Mother   . Thyroid disease Mother   . Thyroid disease Maternal Grandmother   . Arthritis Maternal Grandfather   . Arthritis Paternal Grandmother   . Heart attack Paternal Grandfather   . Cancer Neg Hx     Outpatient Encounter Prescriptions as of 04/22/2016  Medication Sig Note  . BELVIQ 10 MG TABS Take 1 tablet by mouth 2 (two) times daily.   Marland Kitchen glucose blood (BAYER CONTOUR NEXT TEST) test strip Checks blood sugar 10x daily   . insulin glulisine (APIDRA) 100 UNIT/ML injection Use 300 units in insulin pump every 48 hours   . Insulin Pen Needle 31G X 5 MM MISC Check Blood glucose 10x day   . loratadine (CLARITIN) 10 MG tablet Take 10 mg by mouth daily.   . Multiple Vitamin (MULTIVITAMIN) tablet Take 1 tablet by mouth daily.     . butalbital-acetaminophen-caffeine (FIORICET, ESGIC) 50-325-40 MG tablet Reported on 04/22/2016 04/22/2016: Prescribed recently by Dr. Helane Rima, only took once so far  . glucagon 1 MG injection Use for Severe Hypoglycemia . Inject 41m intramuscularly if unresponsive, unable to swallow, unconscious and/or has seizure (Patient not taking: Reported on 04/22/2016)   . Insulin Glargine (LANTUS  SOLOSTAR) 100 UNIT/ML Solostar Pen Use up to 50 units daily if pump fails. (Patient not taking: Reported on 04/13/2016)   . insulin lispro (HUMALOG KWIKPEN) 100 UNIT/ML KiwkPen Up to 50 units/day if pump fails (Patient not taking: Reported on 04/13/2016)    No facility-administered encounter medications on file as of 04/22/2016.   Takes a calcium supplement once daily (unsure of dose or if has D in it).  No Known Allergies   ROS: The patient denies anorexia, fever, weight changes (losing some of the weight she gained from OCP's with use of Belviq), vision changes, decreased hearing, ear pain, sore throat (not currently), breast concerns, chest pain, palpitations, dizziness, syncope, dyspnea on exertion, cough, swelling, nausea, vomiting, diarrhea, constipation, abdominal pain, melena, hematochezia, indigestion/heartburn, hematuria, incontinence, dysuria, irregular menstrual cycles, vaginal discharge, odor or itch, genital lesions, joint pains, numbness, tingling, weakness, tremor, suspicious skin lesions, depression, anxiety, abnormal bleeding/bruising, or enlarged lymph nodes. Some anxiety, worse at home than at school.  PHYSICAL EXAM:  BP 110/68 mmHg  Pulse 92  Ht 5' 4"  (1.626 m)  Wt 126 lb 12.8 oz (57.516 kg)  BMI 21.75 kg/m2  LMP 04/19/2016   General Appearance:    Alert, cooperative, no distress, appears stated age  Head:    Normocephalic, without obvious abnormality, atraumatic  Eyes:    PERRL, conjunctiva/corneas clear, EOM's intact, fundi    benign  Ears:    Normal TM's and external ear canals  Nose:   Nares normal, mucosa normal, no drainage or sinus   tenderness  Throat:   Lips, mucosa, and tongue normal; teeth and gums normal. Tonsils are just mildly enlarged bilaterally, not cryptic.  No erythema or exudate  Neck:   Supple, no lymphadenopathy;  thyroid:  no   enlargement/tenderness/nodules; no carotid   bruit or JVD  Back:    Spine nontender, no curvature, ROM normal, no CVA      tenderness  Lungs:     Clear to auscultation bilaterally without wheezes, rales or     ronchi; respirations unlabored  Chest Wall:    No tenderness or deformity   Heart:    Regular rate and rhythm, S1 and S2 normal, no murmur, rub   or gallop  Breast Exam:    No tenderness, masses, or nipple discharge or inversion.      No axillary lymphadenopathy. Instructed on self breast exam.  Abdomen:     Soft, non-tender, nondistended, normoactive bowel sounds,    no masses, no hepatosplenomegaly  Genitalia:    Not performed (has GYN, who will do when appropriate)  Rectal:    Not performed due to age<40 and no related complaints  Extremities:   No clubbing, cyanosis or edema  Pulses:   2+ and symmetric all extremities  Skin:   Skin color, texture, turgor normal, no rashes. Slight healing blisters from flip flops between first and second toes bilaterally  Lymph nodes:   Cervical, supraclavicular, and axillary nodes normal  Neurologic:   CNII-XII intact, normal strength, sensation and gait; reflexes 2+ and symmetric throughout          Psych:   Normal mood, affect, hygiene and grooming.    Normal monofilament exam  ASSESSMENT/PLAN:  Annual physical exam - Plan: POCT Urinalysis Dipstick  Uncontrolled type 1 diabetes mellitus with diabetic autonomic neuropathy ([place]) - cont to follow with endo; reviewed risks, importance of gaining control. Discussed stress of chronic disease  Autonomic neuropathy associated with type 1 diabetes mellitus ([place]) - per endo, trying for better control of DM to see if improves. no treatment at this time  Migraine without aura and without status migrainosus, not intractable - discussed triggers, acute meds, keeping headache journal. Change to triptan if fioricet ineffective.  f/u 4-6wks  Nausea and vomiting, intractability of vomiting not specified, unspecified vomiting type - related to migraines.  use Zofran prn - Plan: ondansetron (ZOFRAN ODT) 4 MG disintegrating  tablet  Screen for STD (sexually transmitted disease) - she denies being checked by GYN. Regular condom use and Plan B discussed - Plan: GC/Chlamydia Probe Amp, CANCELED: GC/chlamydia probe amp, urine    Discussed monthly self breast exams and yearly mammograms after the age of 11; at least 30 minutes of aerobic activity at least 5 days/week, weight bearing exercise at least 2x/week; proper sunscreen use reviewed; healthy diet, including goals of calcium and vitamin D intake reviewed; regular seatbelt use; changing batteries in smoke detectors.  Immunization recommendations discussed--recommend Men G (Bexsero) and Gardisil series. Check with pediatrician re "immune" for varicella  per NCIR, ie copies of titers if done.  Declines vaccines today--as she is here without her mother, and concerned about side effects (ie syncope) and driving. She will return with her mother. HPV #1 and Bexsero #1 are recommended, and to then schedule NV for the remainder of the HPV series (such that she can complete it over Christmas break), and get the 2nd HPV and Bexsero at the same time later this summer.  Keep a headache journal--indicate the severity, if there were triggers, what you took, what helped/didn't help, how long it lasted. If headaches remain at least 4/month, then we should consider a preventative medication.  Topamax would be one I would consider starting first. If the fioricet you have doesn't work well for the migraines, we should consider medications such as imitrex (or maxalt, etc).    Return in 1-2 months with your headache log to discuss the next step further.  At the early onset of headache, take 866m of ibuprofen along with caffeine (diet coke or coffee or tea), or 2 aleve with caffeine, or an Excedrin Migraine.  Taking medication early on, rather than waiting for the full blown migraine, can often help prevent the prolonged, severe headaches.  Use the zofran as needed for nausea.

## 2016-04-22 NOTE — Patient Instructions (Addendum)
  HEALTH MAINTENANCE RECOMMENDATIONS:  It is recommended that you get at least 30 minutes of aerobic exercise at least 5 days/week (for weight loss, you may need as much as 60-90 minutes). This can be any activity that gets your heart rate up. This can be divided in 10-15 minute intervals if needed, but try and build up your endurance at least once a week.  Weight bearing exercise is also recommended twice weekly.  Eat a healthy diet with lots of vegetables, fruits and fiber.  "Colorful" foods have a lot of vitamins (ie green vegetables, tomatoes, red peppers, etc).  Limit sweet tea, regular sodas and alcoholic beverages, all of which has a lot of calories and sugar.  Drink a lot of water.  Calcium recommendations are 1200 mg daily, and vitamin D 1000 IU daily.  This should be obtained from diet and/or supplements (vitamins), and calcium should not be taken all at once, but in divided doses.  Monthly self breast exams and yearly mammograms for women over the age of 20 is recommended.  Sunscreen of at least SPF 30 should be used on all sun-exposed parts of the skin when outside between the hours of 10 am and 4 pm (not just when at beach or pool, but even with exercise, golf, tennis, and yard work!)  Use a sunscreen that says "broad spectrum" so it covers both UVA and UVB rays, and make sure to reapply every 1-2 hours.  Remember to change the batteries in your smoke detectors when changing your clock times in the spring and fall.  Use your seat belt every time you are in a car, and please drive safely and not be distracted with cell phones and texting while driving.   Keep a headache journal--indicate the severity, if there were triggers, what you took, what helped/didn't help, how long it lasted. If headaches remain at least 4/month, then we should consider a preventative medication.  Topamax would be one I would consider starting first. If the fioricet you have doesn't work well for the migraines,  we should consider medications such as imitrex (or maxalt, etc).    Return in 1-2 months with your headache log to discuss the next step further.  At the early onset of headache, take 800mg  of ibuprofen along with caffeine (diet coke or coffee or tea), or 2 aleve with caffeine, or an Excedrin Migraine.  Taking medication early on, rather than waiting for the full blown migraine, can often help prevent the prolonged, severe headaches.  Use the zofran as needed for nausea.  The recommended vaccines are Meningitis B (Bexsero--series of 2 injections, a month apart) and the Gardisil (HPV vaccine), series of a 3, over 6 months. Please schedule a nurse visit to get these vaccines.  I will need to look into the chicken pox vaccine a little deeper.  Currently I see just one at age 59, and usually there is a booster (or a blood test).  I'll check the [place] registry and your records.  If none found, it is recommended.

## 2016-04-23 LAB — GC/CHLAMYDIA PROBE AMP
CT Probe RNA: NOT DETECTED
GC Probe RNA: NOT DETECTED

## 2016-04-27 ENCOUNTER — Other Ambulatory Visit: Payer: Self-pay | Admitting: Pediatric Endocrinology

## 2016-04-27 ENCOUNTER — Telehealth: Payer: Self-pay | Admitting: Family

## 2016-04-28 NOTE — Telephone Encounter (Signed)
Paperwork faxed to [facility].

## 2016-04-29 ENCOUNTER — Ambulatory Visit: Payer: Self-pay | Admitting: Family Medicine

## 2016-04-30 ENCOUNTER — Telehealth: Payer: Self-pay | Admitting: Family

## 2016-04-30 ENCOUNTER — Other Ambulatory Visit: Payer: Self-pay | Admitting: *Deleted

## 2016-04-30 DIAGNOSIS — E1065 Type 1 diabetes mellitus with hyperglycemia: Principal | ICD-10-CM

## 2016-04-30 DIAGNOSIS — IMO0001 Reserved for inherently not codable concepts without codable children: Secondary | ICD-10-CM

## 2016-04-30 MED ORDER — GLUCOSE BLOOD VI STRP: ORAL_STRIP | Status: DC

## 2016-04-30 NOTE — Telephone Encounter (Signed)
I told her the rx was approved, but she still thinks the prior auth needs to be done.

## 2016-04-30 NOTE — Telephone Encounter (Signed)
LVM to advised that started PA for test strips.

## 2016-04-30 NOTE — Telephone Encounter (Signed)
PA is approved, called [facility] to confirm that they received it and they have. Sent in order for the test strips, mom to call and request the mail order. Called mom Misty Stanley([name]) om with information given.

## 2016-05-07 ENCOUNTER — Telehealth: Payer: Self-pay | Admitting: Internal Medicine

## 2016-05-07 ENCOUNTER — Other Ambulatory Visit (INDEPENDENT_AMBULATORY_CARE_PROVIDER_SITE_OTHER): Payer: 59

## 2016-05-07 ENCOUNTER — Other Ambulatory Visit: Payer: Self-pay | Admitting: *Deleted

## 2016-05-07 DIAGNOSIS — E1065 Type 1 diabetes mellitus with hyperglycemia: Principal | ICD-10-CM

## 2016-05-07 DIAGNOSIS — Z23 Encounter for immunization: Secondary | ICD-10-CM | POA: Diagnosis not present

## 2016-05-07 DIAGNOSIS — IMO0001 Reserved for inherently not codable concepts without codable children: Secondary | ICD-10-CM

## 2016-05-07 MED ORDER — GLUCOSE BLOOD VI STRP: ORAL_STRIP | Status: DC

## 2016-05-07 NOTE — Telephone Encounter (Signed)
Daughter and mom came in today to get vaccines but mom did not want her daughter getting HPV. They made an appt for July to discuss Vaccines but not sure if the patient will be here with mom to discuss this. Mom is on [employer].

## 2016-06-07 ENCOUNTER — Institutional Professional Consult (permissible substitution): Payer: 59 | Admitting: Family Medicine

## 2016-06-23 ENCOUNTER — Other Ambulatory Visit (INDEPENDENT_AMBULATORY_CARE_PROVIDER_SITE_OTHER): Payer: 59

## 2016-06-23 DIAGNOSIS — Z23 Encounter for immunization: Secondary | ICD-10-CM | POA: Diagnosis not present

## 2016-07-06 ENCOUNTER — Encounter: Payer: Self-pay | Admitting: Neurology

## 2016-07-06 ENCOUNTER — Ambulatory Visit (INDEPENDENT_AMBULATORY_CARE_PROVIDER_SITE_OTHER): Payer: 59 | Admitting: Neurology

## 2016-07-06 VITALS — BP 102/68 | HR 107 | Ht 64.0 in | Wt 121.4 lb

## 2016-07-06 DIAGNOSIS — E0869 Diabetes mellitus due to underlying condition with other specified complication: Secondary | ICD-10-CM

## 2016-07-06 DIAGNOSIS — R110 Nausea: Secondary | ICD-10-CM

## 2016-07-06 DIAGNOSIS — E0865 Diabetes mellitus due to underlying condition with hyperglycemia: Secondary | ICD-10-CM | POA: Diagnosis not present

## 2016-07-06 DIAGNOSIS — G43709 Chronic migraine without aura, not intractable, without status migrainosus: Secondary | ICD-10-CM

## 2016-07-06 MED ORDER — SUMATRIPTAN SUCCINATE 100 MG PO TABS: 100.0000 mg | ORAL_TABLET | Freq: Once | ORAL | 12 refills | Status: DC | PRN

## 2016-07-06 MED ORDER — TOPIRAMATE 25 MG PO TABS: 50.0000 mg | ORAL_TABLET | Freq: Every day | ORAL | 12 refills | Status: DC

## 2016-07-06 NOTE — Patient Instructions (Addendum)
Remember to drink plenty of fluid, eat healthy meals and do not skip any meals. Try to eat protein with a every meal and eat a healthy snack such as fruit or nuts in between meals. Try to keep a regular sleep-wake schedule and try to exercise daily, particularly in the form of walking, 20-30 minutes a day, if you can.   As far as your medications are concerned, I would like to suggest:  Imitrex at onset of headache. Can repeat in 2 hours if needed. Topamax: Start with 25mg  at night for one week and increase to 50mg  at night. May need to increase further.   Imitrex at onset of migraine. May repeat in 2 hours once if needed.  Our phone number is [phone]. We also have an after hours call service for urgent matters and there is a physician on-call for urgent questions. For any emergencies you know to call 911 or go to the nearest emergency room Sumatriptan tablets What is this medicine? SUMATRIPTAN (soo ma TRIP tan) is used to treat migraines with or without aura. An aura is a strange feeling or visual disturbance that warns you of an attack. It is not used to prevent migraines. This medicine may be used for other purposes; ask your health care provider or pharmacist if you have questions. What should I tell my health care provider before I take this medicine? They need to know if you have any of these conditions: -circulation problems in fingers and toes -diabetes -heart disease -high blood pressure -high cholesterol -history of irregular heartbeat -history of stroke -kidney disease -liver disease -postmenopausal or surgical removal of uterus and ovaries -seizures -smoke tobacco -stomach or intestine problems -an unusual or allergic reaction to sumatriptan, other medicines, foods, dyes, or preservatives -pregnant or trying to get pregnant -breast-feeding How should I use this medicine? Take this medicine by mouth with a glass of water. Follow the directions on the prescription  label. This medicine is taken at the first symptoms of a migraine. It is not for everyday use. If your migraine headache returns after one dose, you can take another dose as directed. You must leave at least 2 hours between doses, and do not take more than 100 mg as a single dose. Do not take more than 200 mg total in any 24 hour period. If there is no improvement at all after the first dose, do not take a second dose without talking to your doctor or health care professional. Do not take your medicine more often than directed. Talk to your pediatrician regarding the use of this medicine in children. Special care may be needed. Overdosage: If you think you have taken too much of this medicine contact a poison control center or emergency room at once. NOTE: This medicine is only for you. Do not share this medicine with others. What if I miss a dose? This does not apply; this medicine is not for regular use. What may interact with this medicine? Do not take this medicine with any of the following medicines: -cocaine -ergot alkaloids like dihydroergotamine, ergonovine, ergotamine, methylergonovine -feverfew -MAOIs like Carbex, Eldepryl, Marplan, Nardil, and Parnate -other medicines for migraine headache like almotriptan, eletriptan, frovatriptan, naratriptan, rizatriptan, zolmitriptan -tryptophan This medicine may also interact with the following medications: -certain medicines for depression, anxiety, or psychotic disturbances This list may not describe all possible interactions. Give your health care provider a list of all the medicines, herbs, non-prescription drugs, or dietary supplements you use. Also tell them if you  smoke, drink alcohol, or use illegal drugs. Some items may interact with your medicine. What should I watch for while using this medicine? Only take this medicine for a migraine headache. Take it if you get warning symptoms or at the start of a migraine attack. It is not for regular  use to prevent migraine attacks. You may get drowsy or dizzy. Do not drive, use machinery, or do anything that needs mental alertness until you know how this medicine affects you. To reduce dizzy or fainting spells, do not sit or stand up quickly, especially if you are an older patient. Alcohol can increase drowsiness, dizziness and flushing. Avoid alcoholic drinks. Smoking cigarettes may increase the risk of heart-related side effects from using this medicine. If you take migraine medicines for 10 or more days a month, your migraines may get worse. Keep a diary of headache days and medicine use. Contact your healthcare professional if your migraine attacks occur more frequently. What side effects may I notice from receiving this medicine? Side effects that you should report to your doctor or health care professional as soon as possible: -allergic reactions like skin rash, itching or hives, swelling of the face, lips, or tongue -bloody or watery diarrhea -hallucination, loss of contact with reality -pain, tingling, numbness in the face, hands, or feet -seizures -signs and symptoms of a blood clot such as breathing problems; changes in vision; chest pain; severe, sudden headache; pain, swelling, warmth in the leg; trouble speaking; sudden numbness or weakness of the face, arm, or leg -signs and symptoms of a dangerous change in heartbeat or heart rhythm like chest pain; dizziness; fast or irregular heartbeat; palpitations, feeling faint or lightheaded; falls; breathing problems -signs and symptoms of a stroke like changes in vision; confusion; trouble speaking or understanding; severe headaches; sudden numbness or weakness of the face, arm, or leg; trouble walking; dizziness; loss of balance or coordination -stomach pain Side effects that usually do not require medical attention (report these to your doctor or health care professional if they continue or are bothersome): -changes in taste -facial  flushing -headache -muscle cramps -muscle pain -nausea, vomiting -weak or tired This list may not describe all possible side effects. Call your doctor for medical advice about side effects. You may report side effects to FDA at 1-800-FDA-1088. Where should I keep my medicine? Keep out of the reach of children. Store at room temperature between 2 and 30 degrees C (36 and 86 degrees F). Throw away any unused medicine after the expiration date. NOTE: This sheet is a summary. It may not cover all possible information. If you have questions about this medicine, talk to your doctor, pharmacist, or health care provider.    2016, Elsevier/Gold Standard. (2015-05-15 17:46:40)  Topiramate tablets What is this medicine? TOPIRAMATE (toe PYRE a mate) is used to treat seizures in adults or children with epilepsy. It is also used for the prevention of migraine headaches. This medicine may be used for other purposes; ask your health care provider or pharmacist if you have questions. What should I tell my health care provider before I take this medicine? They need to know if you have any of these conditions: -bleeding disorders -cirrhosis of the liver or liver disease -diarrhea -glaucoma -kidney stones or kidney disease -low blood counts, like low white cell, platelet, or red cell counts -lung disease like asthma, obstructive pulmonary disease, emphysema -metabolic acidosis -on a ketogenic diet -schedule for surgery or a procedure -suicidal thoughts, plans, or attempt; a previous  suicide attempt by you or a family member -an unusual or allergic reaction to topiramate, other medicines, foods, dyes, or preservatives -pregnant or trying to get pregnant -breast-feeding How should I use this medicine? Take this medicine by mouth with a glass of water. Follow the directions on the prescription label. Do not crush or chew. You may take this medicine with meals. Take your medicine at regular intervals. Do  not take it more often than directed. Talk to your pediatrician regarding the use of this medicine in children. Special care may be needed. While this drug may be prescribed for children as young as 102 years of age for selected conditions, precautions do apply. Overdosage: If you think you have taken too much of this medicine contact a poison control center or emergency room at once. NOTE: This medicine is only for you. Do not share this medicine with others. What if I miss a dose? If you miss a dose, take it as soon as you can. If your next dose is to be taken in less than 6 hours, then do not take the missed dose. Take the next dose at your regular time. Do not take double or extra doses. What may interact with this medicine? Do not take this medicine with any of the following medications: -probenecid This medicine may also interact with the following medications: -acetazolamide -alcohol -amitriptyline -aspirin and aspirin-like medicines -birth control pills -certain medicines for depression -certain medicines for seizures -certain medicines that treat or prevent blood clots like warfarin, enoxaparin, dalteparin, apixaban, dabigatran, and rivaroxaban -digoxin -hydrochlorothiazide -lithium -medicines for pain, sleep, or muscle relaxation -metformin -methazolamide -NSAIDS, medicines for pain and inflammation, like ibuprofen or naproxen -pioglitazone -risperidone This list may not describe all possible interactions. Give your health care provider a list of all the medicines, herbs, non-prescription drugs, or dietary supplements you use. Also tell them if you smoke, drink alcohol, or use illegal drugs. Some items may interact with your medicine. What should I watch for while using this medicine? Visit your doctor or health care professional for regular checks on your progress. Do not stop taking this medicine suddenly. This increases the risk of seizures if you are using this medicine to  control epilepsy. Wear a medical identification bracelet or chain to say you have epilepsy or seizures, and carry a card that lists all your medicines. This medicine can decrease sweating and increase your body temperature. Watch for signs of deceased sweating or fever, especially in children. Avoid extreme heat, hot baths, and saunas. Be careful about exercising, especially in hot weather. Contact your health care provider right away if you notice a fever or decrease in sweating. You should drink plenty of fluids while taking this medicine. If you have had kidney stones in the past, this will help to reduce your chances of forming kidney stones. If you have stomach pain, with nausea or vomiting and yellowing of your eyes or skin, call your doctor immediately. You may get drowsy, dizzy, or have blurred vision. Do not drive, use machinery, or do anything that needs mental alertness until you know how this medicine affects you. To reduce dizziness, do not sit or stand up quickly, especially if you are an older patient. Alcohol can increase drowsiness and dizziness. Avoid alcoholic drinks. If you notice blurred vision, eye pain, or other eye problems, seek medical attention at once for an eye exam. The use of this medicine may increase the chance of suicidal thoughts or actions. Pay special attention to how  you are responding while on this medicine. Any worsening of mood, or thoughts of suicide or dying should be reported to your health care professional right away. This medicine may increase the chance of developing metabolic acidosis. If left untreated, this can cause kidney stones, bone disease, or slowed growth in children. Symptoms include breathing fast, fatigue, loss of appetite, irregular heartbeat, or loss of consciousness. Call your doctor immediately if you experience any of these side effects. Also, tell your doctor about any surgery you plan on having while taking this medicine since this may  increase your risk for metabolic acidosis. Birth control pills may not work properly while you are taking this medicine. Talk to your doctor about using an extra method of birth control. Women who become pregnant while using this medicine may enroll in the [place] American Antiepileptic Drug Pregnancy Registry by calling [phone]. This registry collects information about the safety of antiepileptic drug use during pregnancy. What side effects may I notice from receiving this medicine? Side effects that you should report to your doctor or health care professional as soon as possible: -allergic reactions like skin rash, itching or hives, swelling of the face, lips, or tongue -decreased sweating and/or rise in body temperature -depression -difficulty breathing, fast or irregular breathing patterns -difficulty speaking -difficulty walking or controlling muscle movements -hearing impairment -redness, blistering, peeling or loosening of the skin, including inside the mouth -tingling, pain or numbness in the hands or feet -unusual bleeding or bruising -unusually weak or tired -worsening of mood, thoughts or actions of suicide or dying Side effects that usually do not require medical attention (report to your doctor or health care professional if they continue or are bothersome): -altered taste -back pain, joint or muscle aches and pains -diarrhea, or constipation -headache -loss of appetite -nausea -stomach upset, indigestion -tremors This list may not describe all possible side effects. Call your doctor for medical advice about side effects. You may report side effects to FDA at 1-800-FDA-1088. Where should I keep my medicine? Keep out of the reach of children. Store at room temperature between 15 and 30 degrees C (59 and 86 degrees F) in a tightly closed container. Protect from moisture. Throw away any unused medicine after the expiration date. NOTE: This sheet is a summary. It may not  cover all possible information. If you have questions about this medicine, talk to your doctor, pharmacist, or health care provider.    2016, Elsevier/Gold Standard. (2013-11-12 23:17:57)

## 2016-07-06 NOTE — Progress Notes (Signed)
[facility]    Provider:  Dr Jaynee Eagles Referring Provider: Rita Ohara, MD Primary Care Physician:  Vikki Ports, MD  CC:  headaches  HPI:  Paula Miller is a 20 y.o. female here as a referral from Dr. Tomi Bamberger for headaches. Mother here who provides much information as well. 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 Can start unilaterally or whole, sometimes dull often throbbing and pounding, nausea, light sensitive, sound sensitive. She has them every other week. Chiropractor has helped a little bit. They can last a whole day. She has fioricet, this is the first thing she has tried. It works. She has headaches every day. She has had to leave work early and stay home from class. She has a headache every day, possibly 10 migrainous monthly with 2 serious necessitating medication every month. Migraines started as a child, saw Dr. Gaynell Face. Mother with migraines. FHx astrocytoma. Unknown triggers or food triggers.  Reviewed notes, labs and imaging from outside physicians, which showed:  Hemoglobin A1c 9.7 on May 2017. BUN 12 and creatinine 0.76 in February 2017. TSH 1.173 very 25th 2017.   Review of Systems: Patient complains of symptoms per HPI as well as the following symptoms: Blurred vision, murmur, headache. Pertinent negatives per HPI. All others negative.   Social History   Social History  . Marital status: Single    Spouse name: N/A  . Number of children: 0  . Years of education: 12+   Occupational History  . Student[employer]yler [employer]is    Social History Main Topics  . Smoking status: Never Smoker  . Smokeless tobacco: Never Used  . Alcohol use No  . Drug use: No  . Sexual activity: Yes    Partners: Male    Birth control/ protection: Condom   Other Topics Concern  . Not on file   Social History  Narrative   Ship broker at [employer], [place] [employer]. Will be living in house on campus.   Parents live in [place], 1 younger brother.    (mother--[name] Benham, grandparents are the [place])   1 dog at home   Works in [employer] office at [employer] (summer and during school year)      Caffeine use: daily    Family History  Problem Relation Age of Onset  . Allergies Mother   . Thyroid disease Mother   . Thyroid disease Maternal Grandmother   . Arthritis Maternal Grandfather   . Arthritis Paternal Grandmother   . Heart attack Paternal Grandfather   . Cancer Neg Hx     Past Medical History:  Diagnosis Date  . Allergy    seasonal allergies  . Goiter   . Hypoglycemia associated with diabetes ([place])   . Physical growth delay   . Type 1 diabetes mellitus in patient age 2-19 years with HbA1C goal below 7.5 diagnosed age 53  . VSD (ventricular septal defect)     Past Surgical History:  Procedure Laterality Date  . FRENULECTOMY, LINGUAL    . periodontal surgery      Current Outpatient Prescriptions  Medication Sig Dispense Refill  . butalbital-acetaminophen-caffeine (FIORICET, ESGIC) 50-325-40 MG tablet Reported on 04/22/2016  3  . glucagon 1 MG injection Use for Severe Hypoglycemia . Inject 87m intramuscularly if unresponsive, unable to swallow, unconscious and/or has seizure 2 kit 6  . glucose blood (BAYER CONTOUR NEXT TEST) test strip CHECK  BLOOD SURGAR 10 TIMES DAILY 900 each 4  . Insulin Glargine (LANTUS SOLOSTAR) 100 UNIT/ML Solostar Pen Use up to 50 units daily if pump fails. 5 pen 6  . insulin glulisine (APIDRA) 100 UNIT/ML injection Use 300 units in insulin pump every 48 hours 20 vial 3  . insulin lispro (HUMALOG KWIKPEN) 100 UNIT/ML KiwkPen Up to 50 units/day if pump fails 5 pen 6  . Insulin Pen Needle 31G X 5 MM MISC Check Blood glucose 10x day 900 each 3  . loratadine (CLARITIN) 10 MG tablet Take 10 mg by mouth daily.    . Multiple Vitamin (MULTIVITAMIN) tablet Take 1 tablet by  mouth daily.      . ondansetron (ZOFRAN ODT) 4 MG disintegrating tablet Take 1 tablet (4 mg total) by mouth every 8 (eight) hours as needed for nausea or vomiting. 20 tablet 0  . SUMAtriptan (IMITREX) 100 MG tablet Take 1 tablet (100 mg total) by mouth once as needed for migraine. May repeat in 2 hours if headache persists or recurs. 10 tablet 12  . topiramate (TOPAMAX) 25 MG tablet Take 2 tablets (50 mg total) by mouth at bedtime. 60 tablet 12   No current facility-administered medications for this visit.     Allergies as of 07/06/2016  . (No Known Allergies)    Vitals: BP 102/68 (BP Location: Right Arm, Patient Position: Sitting, Cuff Size: Normal)   Pulse (!) 107   Ht 5' 4" (1.626 m)   Wt 121 lb 6.4 oz (55.1 kg)   BMI 20.84 kg/m  Last Weight:  Wt Readings from Last 1 Encounters:  07/06/16 121 lb 6.4 oz (55.1 kg) (37 %, Z= -0.34)*   * Growth percentiles are based on CDC 2-20 Years data.   Last Height:   Ht Readings from Last 1 Encounters:  07/06/16 5' 4" (1.626 m) (45 %, Z= -0.12)*   * Growth percentiles are based on CDC 2-20 Years data.   Physical exam: Exam: Gen: NAD, conversant                CV: RRR, no MRG. No Carotid Bruits. No peripheral edema, warm, nontender Eyes: Conjunctivae clear without exudates or hemorrhage  Neuro: Detailed Neurologic Exam  Speech:    Speech is normal; fluent and spontaneous with normal comprehension.  Cognition:    The patient is oriented to person, place, and time;     recent and remote memory intact;     language fluent;     normal attention, concentration,     fund of knowledge Cranial Nerves:    The pupils are equal, round, and reactive to light. The fundi are normal and spontaneous venous pulsations are present. Visual fields are full to finger confrontation. Extraocular movements are intact. Trigeminal sensation is intact and the muscles of mastication are normal. The face is symmetric. The palate elevates in the midline.  Hearing intact. Voice is normal. Shoulder shrug is normal. The tongue has normal motion without fasciculations.   Coordination:    Normal finger to nose and heel to shin. Normal rapid alternating movements.   Gait:    Heel-toe and tandem gait are normal.   Motor Observation:    No asymmetry, no atrophy, and no involuntary movements noted. Tone:    Normal muscle tone.    Posture:    Posture is normal. normal erect    Strength:    Strength is V/V in the upper and lower limbs.      Sensation: intact to  LT     Reflex Exam:  DTR's:    Deep tendon reflexes in the upper and lower extremities are normal bilaterally.   Toes:    The toes are downgoing bilaterally.   Clonus:    Clonus is absent.       Assessment/Plan:  20 year old with chronic migraines without aura. She has uncontrolled diabetes which can be contributing to her migraine frequency and severity, discussed with mother and daughter.  As far as your medications are concerned, I would like to suggest:  Migraine: Imitrex at onset of headache. Can repeat in 2 hours if needed. Topamax: Start with 6m at night for one week and increase to 573mat night. May need to increase further.   Imitrex at onset of migraine. May repeat in 2 hours once if needed. Call physician to discuss stopping belviq since we are starting topiramate   Uncontrolled diabetes: I had a long discussion about the neurologic sequelae of this condition including worsening headaches. Advised her to be compliant with medications and follow closely with physician  Nausea: Zofran prn  Discussed side effects as per patient instructions and teratogenicity  Discussed: To prevent or relieve headaches, try the following: Cool Compress. Lie down and place a cool compress on your head.  Avoid headache triggers. If certain foods or odors seem to have triggered your migraines in the past, avoid them. A headache diary might help you identify triggers.  Include  physical activity in your daily routine. Try a daily walk or other moderate aerobic exercise.  Manage stress. Find healthy ways to cope with the stressors, such as delegating tasks on your to-do list.  Practice relaxation techniques. Try deep breathing, yoga, massage and visualization.  Eat regularly. Eating regularly scheduled meals and maintaining a healthy diet might help prevent headaches. Also, drink plenty of fluids.  Follow a regular sleep schedule. Sleep deprivation might contribute to headaches Consider biofeedback. With this mind-body technique, you learn to control certain bodily functions - such as muscle tension, heart rate and blood pressure - to prevent headaches or reduce headache pain.    Proceed to emergency room if you experience new or worsening symptoms or symptoms do not resolve, if you have new neurologic symptoms or if headache is severe, or for any concerning symptom.   CC: Dr. KnClyda HurdleMD  [facility] [id number]uHilltop[place]rim[place] [address]Phone [phone]

## 2016-07-07 DIAGNOSIS — G43009 Migraine without aura, not intractable, without status migrainosus: Secondary | ICD-10-CM | POA: Insufficient documentation

## 2016-07-14 ENCOUNTER — Ambulatory Visit (INDEPENDENT_AMBULATORY_CARE_PROVIDER_SITE_OTHER): Payer: 59 | Admitting: Family

## 2016-07-14 ENCOUNTER — Encounter: Payer: Self-pay | Admitting: Family

## 2016-07-14 VITALS — BP 118/84 | HR 101 | Wt 120.8 lb

## 2016-07-14 DIAGNOSIS — Z4681 Encounter for fitting and adjustment of insulin pump: Secondary | ICD-10-CM

## 2016-07-14 DIAGNOSIS — F54 Psychological and behavioral factors associated with disorders or diseases classified elsewhere: Secondary | ICD-10-CM | POA: Diagnosis not present

## 2016-07-14 DIAGNOSIS — E1065 Type 1 diabetes mellitus with hyperglycemia: Principal | ICD-10-CM

## 2016-07-14 DIAGNOSIS — Z9641 Presence of insulin pump (external) (internal): Secondary | ICD-10-CM | POA: Diagnosis not present

## 2016-07-14 DIAGNOSIS — IMO0001 Reserved for inherently not codable concepts without codable children: Secondary | ICD-10-CM

## 2016-07-14 DIAGNOSIS — E109 Type 1 diabetes mellitus without complications: Secondary | ICD-10-CM

## 2016-07-14 LAB — POCT GLYCOSYLATED HEMOGLOBIN (HGB A1C): Hemoglobin A1C: 10.9

## 2016-07-14 LAB — GLUCOSE, POCT (MANUAL RESULT ENTRY): POC Glucose: 534 mg/dL — AB (ref 70–99)

## 2016-07-14 NOTE — Patient Instructions (Signed)
12am: 0.675 4am: 0.725 630: 1.40--> 1.45 10am: 1.75--> 1.80 9pm: 1.40--> 1.45   - Marg, you must bolus when you eat - Take time at work to check your blood sugar and give insulin. It only takes a minute! - Check blood sugar at least 4 x per day  - Keep glucose with you at all times  - Make sure you are giving insulin with each meal and to correct for high blood sugars  - If you need anything, please do nt hesitate to contact me via MyChart or by calling the office.   [phone]

## 2016-07-14 NOTE — Progress Notes (Signed)
Pediatric Endocrinology Diabetes Consultation Follow-up Visit  Paula Miller 13-Aug-1996 [id number]  Chief Complaint: Follow-up type 1 diabetes   KNAPP,EVE A, MD   HPI: Paula Miller  is a 20 y.o. female presenting for follow-up of type 1 diabetes.  1. Paula Miller was diagnosed with new onset type 1 diabetes mellitus, dehydration, and ketonuria on 06/09/2005. She was initially treated with a multiple daily injection regimen of Lantus and Novolog insulins.  We converted her to a Medtronic insulin pump in November of 2007.  Although the patient does have a palpable goiter, she has remained euthyroid. Her CMPs, lipid panels, and urinary microalbumin to creatinine ratios have always been normal.   2. Since last visit to [facility] on 03/2016, she has been well.  No ER visits or hospitalizations. She reports that she was having more problems with migraines recently so she went to neurology had is now doing much better. She admits that her diabetes care is not very good right now. 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 She feels like she is so busy that she does not have time to check her blood sugar or give boluses.   She also is concerned that she has "scar tissue" on her hips from putting her pump site there. She has noticed that her blood sugars are not responding as well when she gives insulin and she also can feel that her skin is thicker on her hips. She admits that she has been instructed multiple times in the past to rotate sites but she finds any other site uncomfortable. She is interested in the Omnipod because she feels like it would be more discrete.   Insulin regimen: Medtronic Insulin pump Basal Rates 12AM 0.675   4am  0.725   630am  1.40   10am  1.75   9pm  1.40    Insulin to Carbohydrate Ratio 12AM  10  6am  8  11am 10  5pm 10       Insulin Sensitivity Factor 12AM  70   6am 50  10pm 70         Target  Blood Glucose 12AM  150  6am 110  11pm 150          Hypoglycemia: Able to feel low blood sugars.  No glucagon needed recently.  Blood glucose download: Checking bg 1.6 times per day. Avg Bg 274. She is only bolusing 0.9 times per day, she has 6 days in the last two weeks with no boluses.  Med-alert ID: Not currently wearing. Injection sites: bilateral hips  Annual labs due: 2018 Ophthalmology due: 2017  Discussed with patient today.     3. ROS: Greater than 10 systems reviewed with pertinent positives listed in HPI, otherwise neg. Constitutional: Reports good energy level.  Eyes: No changes in vision Ears/Nose/Mouth/Throat: No difficulty swallowing. Cardiovascular: No palpitations Respiratory: No increased work of breathing Gastrointestinal: No constipation or diarrhea. No abdominal pain Genitourinary: No nocturia, no polyuria Musculoskeletal: No joint pain Neurologic: Normal sensation, no tremor Endocrine: No polydipsia.  No hyperpigmentation Psychiatric: Normal affect  Past Medical History:   Past Medical History:  Diagnosis Date  . Allergy    seasonal allergies  . Goiter   . Hypoglycemia associated with diabetes (HCC)   . Physical growth delay   . Type 1 diabetes mellitus in patient age 20-19 years with HbA1C goal below 7.5 diagnosed age 89  . VSD (ventricular septal defect)  Medications:  Outpatient Encounter Prescriptions as of 07/14/2016  Medication Sig Note  . butalbital-acetaminophen-caffeine (FIORICET, ESGIC) 50-325-40 MG tablet Reported on 04/22/2016 04/22/2016: Prescribed recently by Dr. Vincente Poli, only took once so far  . glucagon 1 MG injection Use for Severe Hypoglycemia . Inject 1mg  intramuscularly if unresponsive, unable to swallow, unconscious and/or has seizure   . glucose blood (BAYER CONTOUR NEXT TEST) test strip CHECK BLOOD SURGAR 10 TIMES DAILY   . Insulin Glargine (LANTUS SOLOSTAR) 100 UNIT/ML Solostar Pen Use up to 50 units daily if pump fails.   .  insulin glulisine (APIDRA) 100 UNIT/ML injection Use 300 units in insulin pump every 48 hours   . insulin lispro (HUMALOG KWIKPEN) 100 UNIT/ML KiwkPen Up to 50 units/day if pump fails   . Insulin Pen Needle 31G X 5 MM MISC Check Blood glucose 10x day   . loratadine (CLARITIN) 10 MG tablet Take 10 mg by mouth daily.   . Multiple Vitamin (MULTIVITAMIN) tablet Take 1 tablet by mouth daily.     . ondansetron (ZOFRAN ODT) 4 MG disintegrating tablet Take 1 tablet (4 mg total) by mouth every 8 (eight) hours as needed for nausea or vomiting.   . SUMAtriptan (IMITREX) 100 MG tablet Take 1 tablet (100 mg total) by mouth once as needed for migraine. May repeat in 2 hours if headache persists or recurs.   . topiramate (TOPAMAX) 25 MG tablet Take 2 tablets (50 mg total) by mouth at bedtime.    No facility-administered encounter medications on file as of 07/14/2016.     Allergies: No Known Allergies  Surgical History: Past Surgical History:  Procedure Laterality Date  . FRENULECTOMY, LINGUAL    . periodontal surgery      Family History:  Family History  Problem Relation Age of Onset  . Allergies Mother   . Thyroid disease Mother   . Thyroid disease Maternal Grandmother   . Arthritis Maternal Grandfather   . Arthritis Paternal Grandmother   . Heart attack Paternal Grandfather   . Cancer Neg Hx       Social History: Lives with: roommate in a house at [employer]  Currently in [place] year in college.   Physical Exam:  Vitals:   07/14/16 1014  BP: 118/84  Pulse: (!) 101  Weight: 54.8 kg (120 lb 12.8 oz)   BP 118/84   Pulse (!) 101   Wt 54.8 kg (120 lb 12.8 oz)   BMI 20.74 kg/m  Body mass index: body mass index is 20.74 kg/m. No height on file for this encounter.  Ht Readings from Last 3 Encounters:  07/06/16 5\' 4"  (1.626 m) (45 %, Z= -0.12)*  04/22/16 5\' 4"  (1.626 m) (45 %, Z= -0.12)*  09/03/14 5' 3.94" (1.624 m) (46 %, Z= -0.11)*   * Growth percentiles are based on  CDC 2-20 Years data.   Wt Readings from Last 3 Encounters:  07/14/16 54.8 kg (120 lb 12.8 oz) (35 %, Z= -0.37)*  07/06/16 55.1 kg (121 lb 6.4 oz) (37 %, Z= -0.34)*  04/22/16 57.5 kg (126 lb 12.8 oz) (48 %, Z= -0.05)*   * Growth percentiles are based on CDC 2-20 Years data.    PHYSICAL EXAM:  Constitutional: The patient appears healthy.  The patient's height and weight are normal for age.  Head: The head is normocephalic. Face: The face appears normal. There are no obvious dysmorphic features. Eyes: The eyes appear to be normally formed and spaced. Gaze is conjugate. There is no obvious  arcus or proptosis. Moisture appears normal. Mouth: The oropharynx appears normal.  Oral moisture is normal. Neck: The neck appears to be visibly normal. The thyroid gland is normal for age. The consistency is normal. The thyroid gland is not tender to palpation. Lungs: The lungs are clear to auscultation. Air movement is good. Heart: Heart rate and rhythm are regular. Heart sounds S1 and S2 are normal. I did not appreciate any pathologic cardiac murmurs. Abdomen: The abdomen is normal in size for the patient's age. Bowel sounds are normal. There is no obvious hepatomegaly, splenomegaly, or other mass effect.  Arms: Muscle size and bulk are normal for age. Hands: There is no obvious tremor. Phalangeal and metacarpophalangeal joints are normal. Palmar muscles are normal for age. Palmar skin is normal. Palmar moisture is also normal. Legs: Muscles appear normal for age. No edema is present. Feet: Feet are normally formed. Dorsalis pedal pulses are normal 1+ bilaterally. Neurologic: Strength is normal for age in both the upper and lower extremities. Muscle tone is normal. Sensation to touch is normal in both the legs and feet.    Labs: Last hemoglobin A1c:  Lab Results  Component Value Date   HGBA1C 10.9 07/14/2016   Results for orders placed or performed in visit on 07/14/16  POCT Glucose (CBG)  Result  Value Ref Range   POC Glucose 534 (A) 70 - 99 mg/dl  POCT HgB [id number]  Result Value Ref Range   Hemoglobin A1C 10.9     Assessment/Plan: Paula Miller is a 20 y.o. female with type 1 diabetes in sub-optimal and varying control. She has been struggling with her diabetes since her last visit. Her new job has kept her busy and she has not done well at balancing her diabetes care in addition to her job. She knows that she needs to do better.  1. DM w/o complication type I, uncontrolled (HCC) - Need to BOLUS with all meals and to correct for high blood sugars.  - Check bg at least 4 times per day  - Discussed starting a CGM sensor.  - POCT Glucose (CBG) - POCT HgB A1C  2. Insulin pump titration Basal Rates 12AM 0.675-   4am  0.725   630am  1.40-->1.45   10am  1.75--> 1.80   9pm  1.40--> 1.45    3. Presence of insulin pump Still present however, she is not rotating sites.   4. Maladaptive health behaviors affecting medical condition - Discussed importance of consistent diabetes care and dangers of not taking care of Type 1 diabetes.  - Discussed bolusing with all meals and for high blood sugars.  - Discussed life/work/diabetes balance.  - Encouragement given and encouraged patient to use MyChart at any time!     Follow-up:   Return in about 2 months (around 09/13/2016).   Medical decision-making:  > 40 minutes spent, more than 50% of appointment was spent discussing diagnosis and management of symptoms  Gretchen ShortSpenser Carrel Leather, FNP-C

## 2016-08-17 ENCOUNTER — Other Ambulatory Visit: Payer: Self-pay | Admitting: Family Medicine

## 2016-08-17 DIAGNOSIS — R112 Nausea with vomiting, unspecified: Secondary | ICD-10-CM

## 2016-09-06 ENCOUNTER — Encounter (INDEPENDENT_AMBULATORY_CARE_PROVIDER_SITE_OTHER): Payer: Self-pay | Admitting: Family

## 2016-09-06 ENCOUNTER — Ambulatory Visit (INDEPENDENT_AMBULATORY_CARE_PROVIDER_SITE_OTHER): Payer: 59 | Admitting: Licensed Clinical Social Worker

## 2016-09-06 ENCOUNTER — Ambulatory Visit (INDEPENDENT_AMBULATORY_CARE_PROVIDER_SITE_OTHER): Payer: 59 | Admitting: Family

## 2016-09-06 VITALS — BP 112/72 | HR 80 | Wt 118.4 lb

## 2016-09-06 DIAGNOSIS — Z9641 Presence of insulin pump (external) (internal): Secondary | ICD-10-CM | POA: Diagnosis not present

## 2016-09-06 DIAGNOSIS — IMO0001 Reserved for inherently not codable concepts without codable children: Secondary | ICD-10-CM

## 2016-09-06 DIAGNOSIS — F54 Psychological and behavioral factors associated with disorders or diseases classified elsewhere: Secondary | ICD-10-CM

## 2016-09-06 DIAGNOSIS — E1065 Type 1 diabetes mellitus with hyperglycemia: Secondary | ICD-10-CM | POA: Diagnosis not present

## 2016-09-06 LAB — GLUCOSE, POCT (MANUAL RESULT ENTRY): POC GLUCOSE: 373 mg/dL — AB (ref 70–99)

## 2016-09-06 NOTE — Progress Notes (Signed)
Pediatric Endocrinology Diabetes Consultation Follow-up Visit  Paula Miller Jul 01, 1996 [id number]  Chief Complaint: Follow-up type 1 diabetes   KNAPP,EVE A, MD   HPI: Paula Miller  is a 20 y.o. female presenting for follow-up of type 1 diabetes.  1. Paula Miller was diagnosed with new onset type 1 diabetes mellitus, dehydration, and ketonuria on 06/09/2005. She was initially treated with a multiple daily injection regimen of Lantus and Novolog insulins.  We converted her to a Medtronic insulin pump in November of 2007.  Although the patient does have a palpable goiter, she has remained euthyroid. Her CMPs, lipid panels, and urinary microalbumin to creatinine ratios have always been normal.   2. Since last visit to [facility] on 07/14/2016, she has been well.  No ER visits or hospitalizations.   Ty reports that she has been very busy with school, work and leading youth group. She has "ignored" her diabetes since she has been so busy with other things. She feels like she has been bolusing a little more but admits she is not checking her blood sugar very often. She has not started wearing the CGM for the Medtronic pump yet, but she is interested in starting it. She also reports that she broke up with her boyfriend recently and this has impacted her diabetes care as well. She is not rotating her pump sites like we discussed at her last visit. She looked into the Omnipod but decided she would rather stay with the Medtronic pump.   Insulin regimen: Medtronic Insulin pump Basal Rates 12AM 0.675   4am  0.725   630am  1.40   10am  1.80   9pm  1.45    Insulin to Carbohydrate Ratio 12AM  10  6am  8  11am 10  5pm 10       Insulin Sensitivity Factor 12AM  70   6am 50  10pm 70         Target Blood Glucose 12AM  150  6am 110  11pm 150          Hypoglycemia: Able to feel low blood sugars.  No glucagon needed recently.  Blood glucose download: Checking Bg 0.6 times per day. She has 8  days in the last two weeks with NO checks. She is bolusing 1.2 times per day and has 3 days in the past two weeks with no checks. Avg Bg is 373.  Last visit: Checking bg 1.6 times per day. Avg Bg 274. She is only bolusing 0.9 times per day, she has 6 days in the last two weeks with no boluses.  Med-alert ID: Not currently wearing. Injection sites: bilateral hips  Annual labs due: 2018 Ophthalmology due: 2017  Discussed with patient today.     3. ROS: Greater than 10 systems reviewed with pertinent positives listed in HPI, otherwise neg. Constitutional: Reports good energy level. Feels stressed with school and work.  Eyes: No changes in vision Ears/Nose/Mouth/Throat: No difficulty swallowing. Denies throat pain, congestion.  Cardiovascular: No palpitations Respiratory: No increased work of breathing Gastrointestinal: No constipation or diarrhea. No abdominal pain Genitourinary: No nocturia, no polyuria Musculoskeletal: No joint pain Neurologic: Normal sensation, no tremor Endocrine: No polydipsia.  No hyperpigmentation Psychiatric: Normal affect  Past Medical History:   Past Medical History:  Diagnosis Date  . Allergy    seasonal allergies  . Goiter   . Hypoglycemia associated with diabetes (HCC)   . Physical growth delay   . Type 1 diabetes mellitus in patient age 44-19 years with HbA1C  goal below 7.5 diagnosed age 89  . VSD (ventricular septal defect)     Medications:  Outpatient Encounter Prescriptions as of 09/06/2016  Medication Sig Note  . butalbital-acetaminophen-caffeine (FIORICET, ESGIC) 50-325-40 MG tablet Reported on 04/22/2016 04/22/2016: Prescribed recently by Dr. Vincente PoliGrewal, only took once so far  . glucagon 1 MG injection Use for Severe Hypoglycemia . Inject 1mg  intramuscularly if unresponsive, unable to swallow, unconscious and/or has seizure   . glucose blood (BAYER CONTOUR NEXT TEST) test strip CHECK BLOOD SURGAR 10 TIMES DAILY   . Insulin Glargine (LANTUS SOLOSTAR) 100  UNIT/ML Solostar Pen Use up to 50 units daily if pump fails.   . insulin glulisine (APIDRA) 100 UNIT/ML injection Use 300 units in insulin pump every 48 hours   . insulin lispro (HUMALOG KWIKPEN) 100 UNIT/ML KiwkPen Up to 50 units/day if pump fails   . Insulin Pen Needle 31G X 5 MM MISC Check Blood glucose 10x day   . loratadine (CLARITIN) 10 MG tablet Take 10 mg by mouth daily.   . Multiple Vitamin (MULTIVITAMIN) tablet Take 1 tablet by mouth daily.     . ondansetron (ZOFRAN-ODT) 4 MG disintegrating tablet TAKE 1 TABLET (4 MG TOTAL) BY MOUTH EVERY 8 (EIGHT) HOURS AS NEEDED FOR NAUSEA OR VOMITING.   . SUMAtriptan (IMITREX) 100 MG tablet Take 1 tablet (100 mg total) by mouth once as needed for migraine. May repeat in 2 hours if headache persists or recurs.   . topiramate (TOPAMAX) 25 MG tablet Take 2 tablets (50 mg total) by mouth at bedtime.    No facility-administered encounter medications on file as of 09/06/2016.     Allergies: No Known Allergies  Surgical History: Past Surgical History:  Procedure Laterality Date  . FRENULECTOMY, LINGUAL    . periodontal surgery      Family History:  Family History  Problem Relation Age of Onset  . Allergies Mother   . Thyroid disease Mother   . Thyroid disease Maternal Grandmother   . Arthritis Maternal Grandfather   . Arthritis Paternal Grandmother   . Heart attack Paternal Grandfather   . Cancer Neg Hx       Social History: Lives with: roommate in a house at [employer]  Currently in [employer] year in [employer].   Physical Exam:  Vitals:   09/06/16 0945  BP: 112/72  Pulse: 80  Weight: 118 lb 6.4 oz (53.7 kg)   BP 112/72   Pulse 80   Wt 118 lb 6.4 oz (53.7 kg)   BMI 20.32 kg/m  Body mass index: body mass index is 20.32 kg/m. No height on file for this encounter.  Ht Readings from Last 3 Encounters:  07/06/16 5\' 4"  (1.626 m) (45 %, Z= -0.12)*  04/22/16 5\' 4"  (1.626 m) (45 %, Z= -0.12)*  09/03/14 5' 3.94" (1.624 m) (46  %, Z= -0.11)*   * Growth percentiles are based on CDC 2-20 Years data.   Wt Readings from Last 3 Encounters:  09/06/16 118 lb 6.4 oz (53.7 kg) (30 %, Z= -0.51)*  07/14/16 120 lb 12.8 oz (54.8 kg) (35 %, Z= -0.37)*  07/06/16 121 lb 6.4 oz (55.1 kg) (37 %, Z= -0.34)*   * Growth percentiles are based on CDC 2-20 Years data.    PHYSICAL EXAM:  Constitutional: The patient appears healthy.  The patient's height and weight are normal for age.  Head: The head is normocephalic. Face: The face appears normal. There are no obvious dysmorphic features. Eyes: The eyes appear  to be normally formed and spaced. Gaze is conjugate. There is no obvious arcus or proptosis. Moisture appears normal. Mouth: The oropharynx appears normal.  Oral moisture is normal. Neck: The neck appears to be visibly normal. The thyroid gland is normal for age. The consistency is normal. The thyroid gland is not tender to palpation. Lungs: The lungs are clear to auscultation. Air movement is good. Heart: Heart rate and rhythm are regular. Heart sounds S1 and S2 are normal. I did not appreciate any pathologic cardiac murmurs. Abdomen: The abdomen is normal in size for the patient's age. Bowel sounds are normal. There is no obvious hepatomegaly, splenomegaly, or other mass effect.  Arms: Muscle size and bulk are normal for age. Hands: There is no obvious tremor. Phalangeal and metacarpophalangeal joints are normal. Palmar muscles are normal for age. Palmar skin is normal. Palmar moisture is also normal. Legs: Muscles appear normal for age. No edema is present. Feet: Feet are normally formed. Dorsalis pedal pulses are normal 1+ bilaterally. Neurologic: Strength is normal for age in both the upper and lower extremities. Muscle tone is normal. Sensation to touch is normal in both the legs and feet.    Labs: Last hemoglobin A1c:   Results for orders placed or performed in visit on 09/06/16  POCT Glucose (CBG)  Result Value Ref  Range   POC Glucose 373 (A) 70 - 99 mg/dl    Assessment/Plan: Kirstin is a 20 y.o. female with type 1 diabetes in poor and worsening control. Moriyah is neglecting her diabetes care due to being busy with her other activities. She is not checking her blood sugar consistently and frequently is not bolusing for her blood sugars and food. She admits that she is not motivated to make changes at this time.  1. DM w/o complication type I, uncontrolled (HCC) - Need to BOLUS with all meals and to correct for high blood sugars.  - Check bg at least 4 times per day  - Discussed starting a CGM sensor.  - POCT Glucose (CBG) - POCT HgB A1C - Goal to check bg at least  2 times per day and bolus 2 times per day.    2. Presence of insulin pump Still present however, she is not rotating sites.   3. Maladaptive health behaviors affecting medical condition - Discussed importance of consistent diabetes care and dangers of not taking care of Type 1 diabetes.  - Discussed bolusing with all meals and for high blood sugars.  - Discussed life/work/diabetes balance.  - Will meet with Behavioral Health today  - Discussed health risk associated with poor diabetes care.     Follow-up:   Return in about 4 weeks (around 10/04/2016).   Medical decision-making:  > 25 minutes spent, more than 50% of appointment was spent discussing diagnosis and management of symptoms  Gretchen Short, FNP-C

## 2016-09-06 NOTE — Patient Instructions (Signed)
-   Goal is 2 checks per day  - 2 boluses per day  - change pump site every 3 days  - You have to make time for your diabetes care!!!  - Call to set up to have CGM started  - Follow up in one month.

## 2016-09-06 NOTE — BH Specialist Note (Signed)
Session Start time: 1012   End Time: 1028 Total Time:  16 minutes Type of Service: Behavioral Health - Individual/Family Interpreter: No.   Interpreter Name & Language: N/A [facility]June [facility]   SUBJECTIVE: Paula Miller is a 20 y.o. female brought in by patient.  Pt./Family was referred by Gretchen ShortSpenser Beasley, NP for:  maladaptive behaviors for health- not completing BG checks or boluses. Pt./Family reports the following symptoms/concerns: is busy and has trouble remembering to do her regular diabetes care Duration of problem:  Since moving out of home to college Severity: moderate Previous treatment: none  OBJECTIVE: Mood: Euthymic & Affect: Appropriate Risk of harm to self or others: No Assessments administered: n/a  LIFE CONTEXT:  Family & Social: lives with a roommate. Parents previously very involved in her care School/ Work: [employer]selling [employer], also works and leads a Scientist, water qualitysmall group Self-Care: takes time for herself at home  Life changes: none cited today What is important to pt/family (values): caring for others, doing well in school   GOALS ADDRESSED:  Cooperate with the medical treatment regimen without passive-aggressive or active resistance.  INTERVENTIONS: Motivational Interviewing and Solution Focused   ASSESSMENT:  Pt/Family currently experiencing forgetting to do regular diabetes care and some frustration with needing to do it every day along with her busy schedule.  Pt/Family may benefit from ongoing work on stress management in addition to her calendars/lists. Able to create a plan today for regular care.    PLAN: 1. F/U with behavioral health clinician: at next visit with medical provider 2. Behavioral recommendations: complete BG checks 2x/day- do when brushing teeth & write reminder on mirror 3. Referral: none 4. From scale of 1-10, how likely are you to follow plan: 7   Sherlie BanMichelle E Malon Siddall LCSWA Behavioral Health Clinician  Marlon PelWarmhandoff:   Warm  Hand Off Completed.      (if yes - put smartphrase - ".warmhndoff", if no then put "no"

## 2016-10-11 ENCOUNTER — Ambulatory Visit (INDEPENDENT_AMBULATORY_CARE_PROVIDER_SITE_OTHER): Payer: 59 | Admitting: Licensed Clinical Social Worker

## 2016-10-11 ENCOUNTER — Ambulatory Visit (INDEPENDENT_AMBULATORY_CARE_PROVIDER_SITE_OTHER): Payer: 59 | Admitting: Family

## 2016-10-11 ENCOUNTER — Encounter (INDEPENDENT_AMBULATORY_CARE_PROVIDER_SITE_OTHER): Payer: Self-pay | Admitting: Family

## 2016-10-11 VITALS — BP 127/80 | HR 121 | Ht 63.78 in | Wt 123.0 lb

## 2016-10-11 DIAGNOSIS — E1065 Type 1 diabetes mellitus with hyperglycemia: Secondary | ICD-10-CM

## 2016-10-11 DIAGNOSIS — IMO0001 Reserved for inherently not codable concepts without codable children: Secondary | ICD-10-CM

## 2016-10-11 DIAGNOSIS — F54 Psychological and behavioral factors associated with disorders or diseases classified elsewhere: Secondary | ICD-10-CM | POA: Diagnosis not present

## 2016-10-11 DIAGNOSIS — Z9641 Presence of insulin pump (external) (internal): Secondary | ICD-10-CM | POA: Diagnosis not present

## 2016-10-11 LAB — POCT GLYCOSYLATED HEMOGLOBIN (HGB A1C): Hemoglobin A1C: 12.3

## 2016-10-11 LAB — GLUCOSE, POCT (MANUAL RESULT ENTRY): POC Glucose: 431 mg/dl — AB (ref 70–99)

## 2016-10-11 NOTE — Progress Notes (Signed)
Pediatric Endocrinology Diabetes Consultation Follow-up Visit  Paula Miller 06/21/1996 [id number]  Chief Complaint: Follow-up type 1 diabetes   KNAPP,EVE A, MD   HPI: Paula Miller  is a 20 y.o. female presenting for follow-up of type 1 diabetes.  1. Paula Miller was diagnosed with new onset type 1 diabetes mellitus, dehydration, and ketonuria on 06/09/2005. She was initially treated with a multiple daily injection regimen of Lantus and Novolog insulins.  We converted her to a Medtronic insulin pump in November of 2007.  Although the patient does have a palpable goiter, she has remained euthyroid. Her CMPs, lipid panels, and urinary microalbumin to creatinine ratios have always been normal.   2. Since last visit to [facility] on 09/06/2016, she has been well.  No ER visits or hospitalizations.   Paula Miller has been very busy with school and work, she is also very stressed because of school and work. She reports that she feels like she has no free time and all she does is work. She admits that she has not been taking care of her diabetes at all. She initially tried to check more and bolus more often but gave up shortly after. She states that she "just hates diabetes" and she is tired of dealing with it.   She is not interested in seeing counseling at her school and also does not want her mother involved in her diabetes care at this time. She states that she has "friends" that she can talk to about her problems and will hopefully help hold her accountable for her diabetes care.    Insulin regimen: Medtronic Insulin pump Basal Rates 12AM 0.675   4am  0.725   630am  1.40   10am  1.80   9pm  1.45    Insulin to Carbohydrate Ratio 12AM  10  6am  8  11am 10  5pm 10       Insulin Sensitivity Factor 12AM  70   6am 50  10pm 70         Target Blood Glucose 12AM  150  6am 110  11pm 150          Hypoglycemia: Able to feel low blood sugars.  No glucagon needed recently.  Blood glucose  download: Checking bg 0.9 times per day. Avg Bg 376.   - She has 8 days with 0 checks and 10 days with just 1 check.   - She is bolusing 1.9 times per day on average.   - She is changing her site every 5 days.   Checking Bg 0.6 times per day. She has 8 days in the last two weeks with NO checks. She is bolusing 1.2 times per day and has 3 days in the past two weeks with no checks. Avg Bg is 373.   Med-alert ID: Not currently wearing. Injection sites: bilateral hips  Annual labs due: 2018 Ophthalmology due: 2017  Discussed with patient today.     3. ROS: Greater than 10 systems reviewed with pertinent positives listed in HPI, otherwise neg. Constitutional: Reports descent energy level. Feels stressed with school and work.  Eyes: No changes in vision. Denies blurry vision. Wears glasses.  Ears/Nose/Mouth/Throat: No difficulty swallowing. Denies throat pain, congestion.  Cardiovascular: No palpitations denies chest pain  Respiratory: No increased work of breathing. Denies SOB  Gastrointestinal: No constipation or diarrhea. No abdominal pain Genitourinary: No nocturia, no polyuria Musculoskeletal: No joint pain Neurologic: Normal sensation, no tremor Endocrine: No polydipsia.  No hyperpigmentation Psychiatric: Normal affect. Denies depression but  acknowledges frustration with diabetes.   Past Medical History:   Past Medical History:  Diagnosis Date  . Allergy    seasonal allergies  . Goiter   . Hypoglycemia associated with diabetes (HCC)   . Physical growth delay   . Type 1 diabetes mellitus in patient age 34-19 years with HbA1C goal below 7.5 diagnosed age 44  . VSD (ventricular septal defect)     Medications:  Outpatient Encounter Prescriptions as of 10/11/2016  Medication Sig Note  . glucagon 1 MG injection Use for Severe Hypoglycemia . Inject 1mg  intramuscularly if unresponsive, unable to swallow, unconscious and/or has seizure   . glucose blood (BAYER CONTOUR NEXT TEST) test  strip CHECK BLOOD SURGAR 10 TIMES DAILY   . Insulin Glargine (LANTUS SOLOSTAR) 100 UNIT/ML Solostar Pen Use up to 50 units daily if pump fails.   . insulin glulisine (APIDRA) 100 UNIT/ML injection Use 300 units in insulin pump every 48 hours   . insulin lispro (HUMALOG KWIKPEN) 100 UNIT/ML KiwkPen Up to 50 units/day if pump fails   . Insulin Pen Needle 31G X 5 MM MISC Check Blood glucose 10x day   . loratadine (CLARITIN) 10 MG tablet Take 10 mg by mouth daily.   . Multiple Vitamin (MULTIVITAMIN) tablet Take 1 tablet by mouth daily.     . ondansetron (ZOFRAN-ODT) 4 MG disintegrating tablet TAKE 1 TABLET (4 MG TOTAL) BY MOUTH EVERY 8 (EIGHT) HOURS AS NEEDED FOR NAUSEA OR VOMITING.   . topiramate (TOPAMAX) 25 MG tablet Take 2 tablets (50 mg total) by mouth at bedtime.   . butalbital-acetaminophen-caffeine (FIORICET, ESGIC) 50-325-40 MG tablet Reported on 04/22/2016 04/22/2016: Prescribed recently by Dr. Vincente Poli, only took once so far  . SUMAtriptan (IMITREX) 100 MG tablet Take 1 tablet (100 mg total) by mouth once as needed for migraine. May repeat in 2 hours if headache persists or recurs.    No facility-administered encounter medications on file as of 10/11/2016.     Allergies: No Known Allergies  Surgical History: Past Surgical History:  Procedure Laterality Date  . FRENULECTOMY, LINGUAL    . periodontal surgery      Family History:  Family History  Problem Relation Age of Onset  . Allergies Mother   . Thyroid disease Mother   . Thyroid disease Maternal Grandmother   . Arthritis Maternal Grandfather   . Arthritis Paternal Grandmother   . Heart attack Paternal Grandfather   . Cancer Neg Hx       Social History: Lives with: roommate in a house at [employer]  Currently in [employer] year in college.   Physical Exam:  Vitals:   10/11/16 0944  BP: 127/80  Pulse: (!) 121  Weight: 123 lb (55.8 kg)  Height: 5' 3.78" (1.62 m)   BP 127/80   Pulse (!) 121   Ht 5' 3.78" (1.62  m)   Wt 123 lb (55.8 kg)   BMI 21.26 kg/m  Body mass index: body mass index is 21.26 kg/m. Growth percentile SmartLinks can only be used for patients less than 20 years old.  Ht Readings from Last 3 Encounters:  10/11/16 5' 3.78" (1.62 m)  07/06/16 5\' 4"  (1.626 m) (45 %, Z= -0.12)*  04/22/16 5\' 4"  (1.626 m) (45 %, Z= -0.12)*   * Growth percentiles are based on CDC 2-20 Years data.   Wt Readings from Last 3 Encounters:  10/11/16 123 lb (55.8 kg)  09/06/16 118 lb 6.4 oz (53.7 kg) (30 %, Z= -0.51)*  07/14/16  120 lb 12.8 oz (54.8 kg) (35 %, Z= -0.37)*   * Growth percentiles are based on CDC 2-20 Years data.    General: Well developed, well nourished female in no acute distress.   Head: Normocephalic, atraumatic.   Eyes:  Pupils equal and round. EOMI.   Sclera white.  No eye drainage.   Ears/Nose/Mouth/Throat: Nares patent, no nasal drainage.  Normal dentition, mucous membranes moist.  Oropharynx intact. Neck: supple, no cervical lymphadenopathy, no thyromegaly Cardiovascular: regular rate, normal S1/S2, no murmurs Respiratory: No increased work of breathing.  Lungs clear to auscultation bilaterally.  No wheezes. Abdomen: soft, nontender, nondistended. Normal bowel sounds.  No appreciable masses  Extremities: warm, well perfused, cap refill < 2 sec.   Musculoskeletal: Normal muscle mass.  Normal strength Skin: warm, dry.  No rash or lesions. Neurologic: alert and oriented, normal speech and gait  Labs: Last hemoglobin A1c:   Results for orders placed or performed in visit on 10/11/16  POCT Glucose (CBG)  Result Value Ref Range   POC Glucose 431 (A) 70 - 99 mg/dl  POCT HgB [id number]  Result Value Ref Range   Hemoglobin A1C 12.3     Assessment/Plan: Paula Miller is a 20 y.o. female with type 1 diabetes in poor and worsening control. Paula Miller is neglecting her diabetes care. Her A1c has significantly worsened along with how she feels about diabetes. She reports diabetes burn out but is  unwilling to speak with counseling.   1. DM w/o complication type I, uncontrolled (HCC) - Need to BOLUS with all meals and to correct for high blood sugars.  - Check bg at least 4 times per day. Start with 0 days without a missed day of checks.  - Discussed starting a CGM sensor.  - POCT Glucose (CBG) - POCT HgB A1C - Ketones negative.    2. Presence of insulin pump Still present however, she is not rotating sites.   3. Maladaptive health behaviors affecting medical condition - Discussed importance of consistent diabetes care and dangers of not taking care of Type 1 diabetes.  - Discussed bolusing with all meals and for high blood sugars.  - Discussed life/work/diabetes balance.  - Will meet with Behavioral Health today  - Discussed health risk associated with poor diabetes care.  - Also had a long discussion about getting parents involved to try and help with care. However, she is very resistance to this idea. Also is refusing counseling at this time.    Follow-up:   1 month   Medical decision-making:  > 25 minutes spent, more than 50% of appointment was spent discussing diagnosis and management of symptoms  Gretchen ShortSpenser Antonae Zbikowski, FNP-C

## 2016-10-11 NOTE — BH Specialist Note (Signed)
Session Start time: 1019   End Time: 1037 Total Time:  18 minutes Type of Service: Behavioral Health - Individual/Family Interpreter: No.   Interpreter Name & Language: N/A [facility] July 2017-June 2018: 2nd   SUBJECTIVE: Paula Miller is a 20 y.o. female brought in by patient.  Pt./Family was referred by Gretchen ShortSpenser Beasley, NP for:  maladaptive behaviors for health- not completing BG checks or boluses. Pt./Family reports the following symptoms/concerns: is busy and has trouble remembering to do her regular diabetes care Duration of problem:  Since moving out of home to college Severity: moderate Previous treatment: none  OBJECTIVE: Mood: Euthymic & Affect: Appropriate Risk of harm to self or others: No Assessments administered: n/a  LIFE CONTEXT:  Family & Social: lives with a roommate. 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  INTERVENTIONS: Motivational Interviewing and Solution Focused   ASSESSMENT:  Pt/Family currently experiencing forgetting to do regular diabetes care and some frustration with needing to do it every day along with her busy schedule.  Pt/Family may benefit from ongoing work on stress management in addition to her calendars/lists. Able to create a plan today for regular care.    PLAN: 1. F/U with behavioral health clinician: at next visit with medical provider 2. Behavioral recommendations: follow recommendations from medical provider. Ask multiple support people to help with completing diabetes care. It is ok to vent and then do your care. 3. Referral: none 4. From scale of 1-10, how likely are you to follow plan:  did not ask   Sherlie BanMichelle E Stoisits LCSWA Behavioral Health Clinician  Warmhandoff: no (if yes - put smartphrase - ".warmhndoff", if no then put "no"

## 2016-10-11 NOTE — Patient Instructions (Signed)
-   No days with 0 test. ( must have 1 test per day)  - At least one bolus per day that includes blood sugar correction.  - Try to change site every 4 days

## 2016-10-22 ENCOUNTER — Other Ambulatory Visit: Payer: Self-pay | Admitting: Pediatric Endocrinology

## 2016-10-22 DIAGNOSIS — IMO0002 Reserved for concepts with insufficient information to code with codable children: Secondary | ICD-10-CM

## 2016-10-22 DIAGNOSIS — E1065 Type 1 diabetes mellitus with hyperglycemia: Secondary | ICD-10-CM

## 2016-11-09 ENCOUNTER — Encounter: Payer: Self-pay | Admitting: Neurology

## 2016-11-09 ENCOUNTER — Ambulatory Visit (INDEPENDENT_AMBULATORY_CARE_PROVIDER_SITE_OTHER): Payer: 59 | Admitting: Neurology

## 2016-11-09 VITALS — BP 116/80 | HR 115 | Ht 63.78 in | Wt 125.8 lb

## 2016-11-09 DIAGNOSIS — G43709 Chronic migraine without aura, not intractable, without status migrainosus: Secondary | ICD-10-CM | POA: Diagnosis not present

## 2016-11-09 DIAGNOSIS — R112 Nausea with vomiting, unspecified: Secondary | ICD-10-CM | POA: Diagnosis not present

## 2016-11-09 MED ORDER — TOPIRAMATE 25 MG PO TABS: 100.0000 mg | ORAL_TABLET | Freq: Every day | ORAL | 12 refills | Status: DC

## 2016-11-09 MED ORDER — RIZATRIPTAN BENZOATE 10 MG PO TBDP: 10.0000 mg | ORAL_TABLET | ORAL | 11 refills | Status: DC | PRN

## 2016-11-09 MED ORDER — ONDANSETRON 4 MG PO TBDP: 4.0000 mg | ORAL_TABLET | Freq: Three times a day (TID) | ORAL | 12 refills | Status: DC | PRN

## 2016-11-09 NOTE — Progress Notes (Signed)
[facility]    Provider:  Dr Jaynee Eagles Referring Provider: Rita Ohara, MD Primary Care Physician:  Vikki Ports, MD   CC:  headaches  Interval history 11/09/2016: She is on topiramate 25m daily. The zofran works. She didn;t like the imitrex. She is still having headaches at least 6-10 significant headaches during the month. She is taking the topamax every night. No side effects. She is having less headaches and the severity is improved. The quality is the same.  She has musculoskeletal neck pain.   Meds; Topiramate, zofran, imitrex(did not like)  HPI:  MAnnel Zunkeris a 20y.o. female here as a referral from Dr. KTomi Bambergerfor headaches. Mother here who provides much information as well. 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 Can start unilaterally or whole, sometimes dull often throbbing and pounding, nausea, light sensitive, sound sensitive. Chiropractor has helped a little bit. They can last a whole day. She has fioricet, this is the first thing she has tried. It works. She has headaches every day. She has had to leave work early and stay home from class. She has a headache every day, possibly 10 migrainous monthly with 2 serious necessitating medication every month. Migraines started as a child, saw Dr. HGaynell Face Mother with migraines. FHx astrocytoma. Unknown triggers or food triggers.  Reviewed notes, labs and imaging from outside physicians, which showed:  Hemoglobin A1c 9.7 on May 2017. BUN 12 and creatinine 0.76 in February 2017. TSH 1.173 very 25th 2017.   Review of Systems: Patient complains of symptoms per HPI as well as the following symptoms: Blurred vision, murmur, headache. Pertinent negatives per HPI. All others negative.   Social History   Social History  . Marital status:  Single    Spouse name: N/A  . Number of children: 0  . Years of education: 12+   Occupational History  . Student-[employer]   Social History Main Topics  . Smoking status: Never Smoker  . Smokeless tobacco: Never Used  . Alcohol use No  . Drug use: No  . Sexual activity: Yes    Partners: Male    Birth control/ protection: Condom   Other Topics Concern  . 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   [place] 1 younger [place].    (mother--[name] [place]   1 dog at home   Works in [employer] at [employer](summer and during school year)      Caffeine use: daily    Family History  Problem Relation Age of Onset  . Allergies Mother   . Thyroid disease Mother   . Thyroid disease Maternal Grandmother   . Arthritis Maternal Grandfather   . Arthritis Paternal Grandmother   . Heart attack Paternal Grandfather   . Cancer Neg Hx     Past Medical History:  Diagnosis Date  . Allergy    seasonal allergies  . Goiter   . Hypoglycemia associated with diabetes ([facility]   . Physical growth delay   . Type 1 diabetes mellitus in patient age 20-19years with HbA1C goal below 7.5 diagnosed age 20 . VSD (ventricular septal defect)     Past Surgical History:  Procedure Laterality Date  . FRENULECTOMY, LINGUAL    .  periodontal surgery      Current Outpatient Prescriptions  Medication Sig Dispense Refill  . APIDRA 100 UNIT/ML injection USE 300 UNITS IN INSULIN  PUMP EVERY 48 HOURS 140 mL 6  . butalbital-acetaminophen-caffeine (FIORICET, ESGIC) 50-325-40 MG tablet Reported on 04/22/2016  3  . glucagon 1 MG injection Use for Severe Hypoglycemia . Inject 6m intramuscularly if unresponsive, unable to swallow, unconscious and/or has seizure 2 kit 6  . glucose blood (BAYER CONTOUR NEXT TEST) test strip CHECK BLOOD SURGAR 10 TIMES DAILY 900 each 4  . Insulin Glargine (LANTUS  SOLOSTAR) 100 UNIT/ML Solostar Pen Use up to 50 units daily if pump fails. 5 pen 6  . insulin lispro (HUMALOG KWIKPEN) 100 UNIT/ML KiwkPen Up to 50 units/day if pump fails 5 pen 6  . Insulin Pen Needle 31G X 5 MM MISC Check Blood glucose 10x day 900 each 3  . loratadine (CLARITIN) 10 MG tablet Take 10 mg by mouth daily.    . Multiple Vitamin (MULTIVITAMIN) tablet Take 1 tablet by mouth daily.      . ondansetron (ZOFRAN-ODT) 4 MG disintegrating tablet TAKE 1 TABLET (4 MG TOTAL) BY MOUTH EVERY 8 (EIGHT) HOURS AS NEEDED FOR NAUSEA OR VOMITING. 20 tablet 0  . SUMAtriptan (IMITREX) 100 MG tablet Take 1 tablet (100 mg total) by mouth once as needed for migraine. May repeat in 2 hours if headache persists or recurs. 10 tablet 12  . topiramate (TOPAMAX) 25 MG tablet Take 2 tablets (50 mg total) by mouth at bedtime. 60 tablet 12   No current facility-administered medications for this visit.     Allergies as of 11/09/2016  . (No Known Allergies)    Vitals: BP 116/80 (BP Location: Left Arm, Patient Position: Sitting, Cuff Size: Normal)   Pulse (!) 115   Ht 5' 3.78" (1.62 m)   Wt 125 lb 12.8 oz (57.1 kg)   BMI 21.74 kg/m  Last Weight:  Wt Readings from Last 1 Encounters:  11/09/16 125 lb 12.8 oz (57.1 kg)   Last Height:   Ht Readings from Last 1 Encounters:  11/09/16 5' 3.78" (1.62 m)     Physical exam: Exam: Gen: NAD, conversant                CV: RRR, no MRG. No Carotid Bruits. No peripheral edema, warm, nontender Eyes: Conjunctivae clear without exudates or hemorrhage  Neuro: Detailed Neurologic Exam  Speech:    Speech is normal; fluent and spontaneous with normal comprehension.  Cognition:    The patient is oriented to person, place, and time;     recent and remote memory intact;     language fluent;     normal attention, concentration,     fund of knowledge Cranial Nerves:    The pupils are equal, round, and reactive to light. The fundi are normal and spontaneous venous  pulsations are present. Visual fields are full to finger confrontation. Extraocular movements are intact. Trigeminal sensation is intact and the muscles of mastication are normal. The face is symmetric. The palate elevates in the midline. Hearing intact. Voice is normal. Shoulder shrug is normal. The tongue has normal motion without fasciculations.   Coordination:    Normal finger to nose and heel to shin. Normal rapid alternating movements.   Gait:    Heel-toe and tandem gait are normal.   Motor Observation:    No asymmetry, no atrophy, and no involuntary movements noted. Tone:    Normal muscle tone.  Posture:    Posture is normal. normal erect    Strength:    Strength is V/V in the upper and lower limbs.      Sensation: intact to LT     Reflex Exam:  DTR's:    Deep tendon reflexes in the upper and lower extremities are normal bilaterally.   Toes:    The toes are downgoing bilaterally.   Clonus:    Clonus is absent.       Assessment/Plan:  20 year old with chronic migraines without aura. She has uncontrolled diabetes which can be contributing to her migraine frequency and severity, discussed with mother and daughter. Improved on Topamax, on 46m at night will increase.   As far as your medications are concerned, I would like to suggest:  Migraine: Maxalt and zofran at onset of headache. Can repeat in 2 hours if needed. Topamax: Increase to 712mat night for one week and increase to 10069mt night. May need to increase further.  Teratogenic, do not get pregnant, use birth control Maxalt at onset of migraine. May repeat in 2 hours once if needed. Call physician to discuss belviq since we are starting topiramate   Uncontrolled diabetes: I had a long discussion about the neurologic sequelae of this condition including worsening headaches. Advised her to be compliant with medications and follow closely with physician  Nausea: Zofran prn  Discussed side  effects as per patient instructions and teratogenicity  Discussed: To prevent or relieve headaches, try the following:  Cool Compress. Lie down and place a cool compress on your head.   Avoid headache triggers. If certain foods or odors seem to have triggered your migraines in the past, avoid them. A headache diary might help you identify triggers.   Include physical activity in your daily routine. Try a daily walk or other moderate aerobic exercise.   Manage stress. Find healthy ways to cope with the stressors, such as delegating tasks on your to-do list.   Practice relaxation techniques. Try deep breathing, yoga, massage and visualization.   Eat regularly. Eating regularly scheduled meals and maintaining a healthy diet might help prevent headaches. Also, drink plenty of fluids.   Follow a regular sleep schedule. Sleep deprivation might contribute to headaches  Consider biofeedback. With this mind-body technique, you learn to control certain bodily functions - such as muscle tension, heart rate and blood pressure - to prevent headaches or reduce headache pain.    Proceed to emergency room if you experience new or worsening symptoms or symptoms do not resolve, if you have new neurologic symptoms or if headache is severe, or for any concerning symptom.   CC: Dr. KnaClyda HurdleD  [facility] [id number] [id number] [phone] [phone] of 30 minutes was spent face-to-face with this patient. Over half this time was spent on counseling patient on the migraine diagnosis and different diagnostic and therapeutic options available.

## 2016-11-09 NOTE — Patient Instructions (Addendum)
Remember to drink plenty of fluid, eat healthy meals and do not skip any meals. Try to eat protein with a every meal and eat a healthy snack such as fruit or nuts in between meals. Try to keep a regular sleep-wake schedule and try to exercise daily, particularly in the form of walking, 20-30 minutes a day, if you can.   As far as your medications are concerned, I would like to : Increase Topiramate to 75mg  at night (3 tabs) for one week and then 100mg  at night (4 tabs) At onset of headache take Maxalt. Please take one tablet at the onset of your headache. If it does not improve the symptoms please take one additional tablet in 2 hours. Can take with the Zofran. Do not take more then 2 tablets in 24hrs. Do not take use more then 2 to 3 days in a week.   I would like to see you back in 6 months, sooner if we need to. Please call us with any interim questions, concerns, problems, updates or refill requests.   Our phone number is [phone]. We also have an after hours call service for urgent matters and there is a physician on-call for urgent questions. For any emergencies you know to call 911 or go to the nearest emergency room

## 2016-11-17 ENCOUNTER — Ambulatory Visit (INDEPENDENT_AMBULATORY_CARE_PROVIDER_SITE_OTHER): Payer: 59 | Admitting: Family

## 2016-11-17 VITALS — BP 100/64 | HR 94 | Ht 63.78 in | Wt 122.2 lb

## 2016-11-17 DIAGNOSIS — E1065 Type 1 diabetes mellitus with hyperglycemia: Secondary | ICD-10-CM

## 2016-11-17 DIAGNOSIS — Z4681 Encounter for fitting and adjustment of insulin pump: Secondary | ICD-10-CM

## 2016-11-17 DIAGNOSIS — Z9119 Patient's noncompliance with other medical treatment and regimen: Secondary | ICD-10-CM

## 2016-11-17 DIAGNOSIS — Z91199 Patient's noncompliance with other medical treatment and regimen due to unspecified reason: Secondary | ICD-10-CM

## 2016-11-17 DIAGNOSIS — IMO0001 Reserved for inherently not codable concepts without codable children: Secondary | ICD-10-CM

## 2016-11-17 LAB — GLUCOSE, POCT (MANUAL RESULT ENTRY): POC Glucose: 206 mg/dl — AB (ref 70–99)

## 2016-11-17 NOTE — Patient Instructions (Signed)
Basal Changes  - 12am: 0.675--> 0.750  - 4am: 0.725--> 0.775 - 630am: 1.45 - 10am: 1.80 - 9pm: 1.45--> 1.50  - Goal will be 2 checks per day and at least 2 boluses per day - If you have food, you should give insulin   - Check blood sugar at least 4 x per day  - Keep glucose with you at all times  - Make sure you are giving insulin with each meal and to correct for high blood sugars  - If you need anything, please do nt hesitate to contact me via MyChart or by calling the office.   [phone]

## 2016-11-23 ENCOUNTER — Encounter (INDEPENDENT_AMBULATORY_CARE_PROVIDER_SITE_OTHER): Payer: Self-pay | Admitting: Family

## 2016-11-23 NOTE — Progress Notes (Signed)
Pediatric Endocrinology Diabetes Consultation Follow-up Visit  Paula Miller 1996/06/05 [id number]  Chief Complaint: Follow-up type 1 diabetes   KNAPP,EVE A, MD   HPI: Paula Miller  is a 21 y.o. female presenting for follow-up of type 1 diabetes.  1. Paula Miller was diagnosed with new onset type 1 diabetes mellitus, dehydration, and ketonuria on 06/09/2005. She was initially treated with a multiple daily injection regimen of Lantus and Novolog insulins.  We converted her to a Medtronic insulin pump in November of 2007.  Although the patient does have a palpable goiter, she has remained euthyroid. Her CMPs, lipid panels, and urinary microalbumin to creatinine ratios have always been normal.   2. Since last visit to [facility] on 10/11/2016, she has been well.  No ER visits or hospitalizations.   Paula Miller reports that she knows "I am not doing well". She states that she continues to struggle with her diabetes care because she is just "to busy" with school and work. She feels like she is bolusing more often but is still not checking her blood sugars. She reports that she does not have very much motivation to take better care of her diabetes. She is aware of possible complications she could experience but she just does not feel like doing it. She rates her motivation as a 2 on a scale of 0-10. She also refuses counseling, she thinks that she can make the changes her help and that counselors will not be helpful to her.    Insulin regimen: Medtronic Insulin pump Basal Rates 12AM 0.675   4am  0.725   630am  1.45   10am  1.80   9pm  1.45    Insulin to Carbohydrate Ratio 12AM  10  6am  8  11am 10  5pm 10       Insulin Sensitivity Factor 12AM  70   6am 50  10pm 70         Target Blood Glucose 12AM  150  6am 110  11pm 150          Hypoglycemia: Able to feel low blood sugars.  No glucagon needed recently.  Blood glucose download: Checking bg 0.7 times per day. Avg Bg 390.   - She has  8 days with 0 checks and 15 days with just 1 check in the last month   - She is bolusing 1.4 times per day on average. She has 5 days with 0 boluses  - She is changing her site every 5 days.  Med-alert ID: Not currently wearing. Injection sites: bilateral hips  Annual labs due: 2018 Ophthalmology due: 2017  Discussed with patient today.     3. ROS: Greater than 10 systems reviewed with pertinent positives listed in HPI, otherwise neg. Constitutional: Reports descent energy level. Feels stressed with school and work.  Eyes: No changes in vision. Denies blurry vision. Wears glasses.  Ears/Nose/Mouth/Throat: No difficulty swallowing. Denies throat pain, congestion.  Cardiovascular: No palpitations denies chest pain  Respiratory: No increased work of breathing. Denies SOB  Gastrointestinal: No constipation or diarrhea. No abdominal pain Genitourinary: No nocturia, no polyuria Musculoskeletal: No joint pain Neurologic: Normal sensation, no tremor Endocrine: No polydipsia.  No hyperpigmentation Psychiatric: Normal affect. Denies depression but acknowledges frustration with diabetes.   Past Medical History:   Past Medical History:  Diagnosis Date  . Allergy    seasonal allergies  . Goiter   . Hypoglycemia associated with diabetes (HCC)   . Physical growth delay   . Type 1 diabetes mellitus  in patient age 71-19 years with HbA1C goal below 7.5 diagnosed age 30  . VSD (ventricular septal defect)     Medications:  Outpatient Encounter Prescriptions as of 11/17/2016  Medication Sig Note  . APIDRA 100 UNIT/ML injection USE 300 UNITS IN INSULIN  PUMP EVERY 48 HOURS   . butalbital-acetaminophen-caffeine (FIORICET, ESGIC) 50-325-40 MG tablet Reported on 04/22/2016 04/22/2016: Prescribed recently by Dr. Vincente Poli, only took once so far  . glucagon 1 MG injection Use for Severe Hypoglycemia . Inject 1mg  intramuscularly if unresponsive, unable to swallow, unconscious and/or has seizure   . glucose blood  (BAYER CONTOUR NEXT TEST) test strip CHECK BLOOD SURGAR 10 TIMES DAILY   . loratadine (CLARITIN) 10 MG tablet Take 10 mg by mouth daily.   . Multiple Vitamin (MULTIVITAMIN) tablet Take 1 tablet by mouth daily.     Marland Kitchen topiramate (TOPAMAX) 25 MG tablet Take 4 tablets (100 mg total) by mouth at bedtime.   . Insulin Glargine (LANTUS SOLOSTAR) 100 UNIT/ML Solostar Pen Use up to 50 units daily if pump fails. (Patient not taking: Reported on 11/17/2016)   . insulin lispro (HUMALOG KWIKPEN) 100 UNIT/ML KiwkPen Up to 50 units/day if pump fails (Patient not taking: Reported on 11/17/2016)   . Insulin Pen Needle 31G X 5 MM MISC Check Blood glucose 10x day (Patient not taking: Reported on 11/17/2016)   . ondansetron (ZOFRAN-ODT) 4 MG disintegrating tablet Take 1 tablet (4 mg total) by mouth every 8 (eight) hours as needed for nausea or vomiting. (Patient not taking: Reported on 11/17/2016)   . rizatriptan (MAXALT-MLT) 10 MG disintegrating tablet Take 1 tablet (10 mg total) by mouth as needed for migraine. May repeat in 2 hours if needed (Patient not taking: Reported on 11/17/2016)    No facility-administered encounter medications on file as of 11/17/2016.     Allergies: No Known Allergies  Surgical History: Past Surgical History:  Procedure Laterality Date  . FRENULECTOMY, LINGUAL    . periodontal surgery      Family History:  Family History  Problem Relation Age of Onset  . Allergies Mother   . Thyroid disease Mother   . Thyroid disease Maternal Grandmother   . Arthritis Maternal Grandfather   . Arthritis Paternal Grandmother   . Heart attack Paternal Grandfather   . Cancer Neg Hx       Social History: Lives with: roommate in a house at [employer]  Currently in [employer] year in college.   Physical Exam:  Vitals:   11/17/16 1028  BP: 100/64  Pulse: 94  Weight: 122 lb 3.2 oz (55.4 kg)  Height: 5' 3.78" (1.62 m)   BP 100/64   Pulse 94   Ht 5' 3.78" (1.62 m)   Wt 122 lb 3.2  oz (55.4 kg)   BMI 21.12 kg/m  Body mass index: body mass index is 21.12 kg/m. Growth percentile SmartLinks can only be used for patients less than 26 years old.  Ht Readings from Last 3 Encounters:  11/17/16 5' 3.78" (1.62 m)  11/09/16 5' 3.78" (1.62 m)  10/11/16 5' 3.78" (1.62 m)   Wt Readings from Last 3 Encounters:  11/17/16 122 lb 3.2 oz (55.4 kg)  11/09/16 125 lb 12.8 oz (57.1 kg)  10/11/16 123 lb (55.8 kg)    General: Well developed, well nourished female in no acute distress. She is quiet today.  Head: Normocephalic, atraumatic.   Eyes:  Pupils equal and round. EOMI.   Sclera white.  No eye drainage.  Ears/Nose/Mouth/Throat: Nares patent, no nasal drainage.  Normal dentition, mucous membranes moist.  Oropharynx intact. Neck: supple, no cervical lymphadenopathy, no thyromegaly Cardiovascular: regular rate, normal S1/S2, no murmurs Respiratory: No increased work of breathing.  Lungs clear to auscultation bilaterally.  No wheezes. Abdomen: soft, nontender, nondistended. Normal bowel sounds.  No appreciable masses  Extremities: warm, well perfused, cap refill < 2 sec.   Musculoskeletal: Normal muscle mass.  Normal strength Skin: warm, dry.  No rash or lesions. Neurologic: alert and oriented, normal speech and gait  Labs:  Results for orders placed or performed in visit on 11/17/16  POCT Glucose (CBG)  Result Value Ref Range   POC Glucose 206 (A) 70 - 99 mg/dl    Assessment/Plan: Paula Miller is a 21 y.o. female with type 1 diabetes in poor and worsening control. Paula Miller is neglecting her diabetes care. She has not been able to find motivation to improve her care and she still reports lack of motivation. She also refuses helpful resources such as counseling.   1. DM w/o complication type I, uncontrolled (HCC) - Need to BOLUS with all meals and to correct for high blood sugars.  - Check bg at least 4 times per day.  - Discussed starting a CGM sensor.  - POCT Glucose  (CBG) - POCT HgB A1C - Reviewed blood sugar log with patient.   2. Insulin pump titration   Basal Changes  - 12am: 0.675--> 0.750  - 4am: 0.725--> 0.775 - 630am: 1.45 - 10am: 1.80 - 9pm: 1.45--> 1.50  3. Maladaptive health behaviors affecting medical condition - Discussed importance of consistent diabetes care and dangers of not taking care of Type 1 diabetes.  - Discussed bolusing with all meals and for high blood sugars.  - Discussed life/work/diabetes balance.  - Discussed health risk associated with poor diabetes care.  - If Paula Miller does not start to improve her diabetes care, bolusing an checking blood sugars more often, it may be in her best interest to put her on 70/30 insulin.    Follow-up:   1 month   Medical decision-making:  > 25 minutes spent, more than 50% of appointment was spent discussing diagnosis and management of symptoms  Gretchen ShortSpenser Elyana Grabski, FNP-C

## 2016-11-29 DIAGNOSIS — M9901 Segmental and somatic dysfunction of cervical region: Secondary | ICD-10-CM | POA: Diagnosis not present

## 2016-11-29 DIAGNOSIS — M9902 Segmental and somatic dysfunction of thoracic region: Secondary | ICD-10-CM | POA: Diagnosis not present

## 2016-11-29 DIAGNOSIS — G43109 Migraine with aura, not intractable, without status migrainosus: Secondary | ICD-10-CM | POA: Diagnosis not present

## 2016-12-15 ENCOUNTER — Encounter (INDEPENDENT_AMBULATORY_CARE_PROVIDER_SITE_OTHER): Payer: Self-pay | Admitting: Family

## 2016-12-15 ENCOUNTER — Ambulatory Visit (INDEPENDENT_AMBULATORY_CARE_PROVIDER_SITE_OTHER): Payer: 59 | Admitting: Family

## 2016-12-15 VITALS — BP 112/70 | HR 90 | Ht 63.98 in | Wt 123.0 lb

## 2016-12-15 DIAGNOSIS — F54 Psychological and behavioral factors associated with disorders or diseases classified elsewhere: Secondary | ICD-10-CM | POA: Diagnosis not present

## 2016-12-15 DIAGNOSIS — E1065 Type 1 diabetes mellitus with hyperglycemia: Secondary | ICD-10-CM | POA: Diagnosis not present

## 2016-12-15 DIAGNOSIS — Z91199 Patient's noncompliance with other medical treatment and regimen due to unspecified reason: Secondary | ICD-10-CM

## 2016-12-15 DIAGNOSIS — IMO0001 Reserved for inherently not codable concepts without codable children: Secondary | ICD-10-CM

## 2016-12-15 DIAGNOSIS — Z9119 Patient's noncompliance with other medical treatment and regimen: Secondary | ICD-10-CM | POA: Diagnosis not present

## 2016-12-15 DIAGNOSIS — Z9641 Presence of insulin pump (external) (internal): Secondary | ICD-10-CM | POA: Diagnosis not present

## 2016-12-15 LAB — GLUCOSE, POCT (MANUAL RESULT ENTRY): POC GLUCOSE: 271 mg/dL — AB (ref 70–99)

## 2016-12-15 NOTE — Progress Notes (Signed)
Pediatric Endocrinology Diabetes Consultation Follow-up Visit  Paula Miller May 31, 1996 [id number]  Chief Complaint: Follow-up type 1 diabetes   KNAPP,EVE A, MD   HPI: Paula Miller  is a 21 y.o. female presenting for follow-up of type 1 diabetes.  1. Paula Miller was diagnosed with new onset type 1 diabetes mellitus, dehydration, and ketonuria on 06/09/2005. She was initially treated with a multiple daily injection regimen of Lantus and Novolog insulins.  We converted her to a Medtronic insulin pump in November of 2007.  Although the patient does have a palpable goiter, she has remained euthyroid. Her CMPs, lipid panels, and urinary microalbumin to creatinine ratios have always been normal.   2. Since last visit to [facility] on 10/2016, she has been well.  No ER visits or hospitalizations.   Paula Miller just finished her winter semester at [employer] and is home on vacation until Monday. She is hoping that her stress level decreases some over the next semester. 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 She feels like she does not have much time to devote to herself. After her last appointment she spent time reflecting about her declining diabetes care. She believe it all started after and her boyfriend broke up and she has just been stressed since then. She now feels much more motivated to make positive changes. She has talked to her roommate about helping to encourage her during the year.    Insulin regimen: Medtronic Insulin pump Basal Rates 12AM 0.750   4am  0.775   630am  1.45   10am  1.80   9pm  1.50    Insulin to Carbohydrate Ratio 12AM  10  6am  8  11am 10  5pm 10       Insulin Sensitivity Factor 12AM  70   6am 50  10pm 70         Target Blood Glucose 12AM  150  6am 110  11pm 150          Hypoglycemia: Able to feel low blood sugars.  No glucagon needed recently.  Blood glucose download: Checking bg 1.4 times per day. Avg Bg 331  -  She has 5 days with 0 checks    - She is bolusing 2.2 times per day.   - She is changing her site every 4-5 days.  Med-alert ID: Not currently wearing. Injection sites: bilateral hips  Annual labs due: 2018 Ophthalmology due: 2018    3. ROS: Greater than 10 systems reviewed with pertinent positives listed in HPI, otherwise neg. Constitutional: Reports good energy and appetite.  Eyes: No changes in vision. Denies blurry vision. Wears glasses.  Ears/Nose/Mouth/Throat: No difficulty swallowing.   Cardiovascular: No palpitations denies chest pain  Respiratory: No increased work of breathing.  Gastrointestinal: No constipation or diarrhea.  Genitourinary: No nocturia, no polyuria Musculoskeletal: No joint pain Neurologic: Normal sensation, no tremor Endocrine: No polydipsia.  No hyperpigmentation Psychiatric: Normal affect.   Past Medical History:   Past Medical History:  Diagnosis Date  . Allergy    seasonal allergies  . Goiter   . Hypoglycemia associated with diabetes (HCC)   . Physical growth delay   . Type 1 diabetes mellitus in patient age 5-19 years with HbA1C goal below 7.5 diagnosed age 7  . VSD (ventricular septal defect)     Medications:  Outpatient Encounter Prescriptions as of 12/15/2016  Medication Sig Note  . APIDRA 100 UNIT/ML injection USE 300 UNITS IN INSULIN  PUMP EVERY 48 HOURS   . glucagon 1 MG injection Use for Severe Hypoglycemia . Inject 1mg  intramuscularly if unresponsive, unable to swallow, unconscious and/or has seizure   . glucose blood (BAYER CONTOUR NEXT TEST) test strip CHECK BLOOD SURGAR 10 TIMES DAILY   . loratadine (CLARITIN) 10 MG tablet Take 10 mg by mouth daily.   . Multiple Vitamin (MULTIVITAMIN) tablet Take 1 tablet by mouth daily.     Marland Kitchen. topiramate (TOPAMAX) 25 MG tablet Take 4 tablets (100 mg total) by mouth at bedtime.   . butalbital-acetaminophen-caffeine (FIORICET, ESGIC) 50-325-40 MG tablet Reported on 04/22/2016 04/22/2016: Prescribed  recently by Dr. Vincente PoliGrewal, only took once so far  . Insulin Glargine (LANTUS SOLOSTAR) 100 UNIT/ML Solostar Pen Use up to 50 units daily if pump fails. (Patient not taking: Reported on 11/17/2016)   . insulin lispro (HUMALOG KWIKPEN) 100 UNIT/ML KiwkPen Up to 50 units/day if pump fails (Patient not taking: Reported on 11/17/2016)   . Insulin Pen Needle 31G X 5 MM MISC Check Blood glucose 10x day (Patient not taking: Reported on 11/17/2016)   . ondansetron (ZOFRAN-ODT) 4 MG disintegrating tablet Take 1 tablet (4 mg total) by mouth every 8 (eight) hours as needed for nausea or vomiting. (Patient not taking: Reported on 11/17/2016)   . rizatriptan (MAXALT-MLT) 10 MG disintegrating tablet Take 1 tablet (10 mg total) by mouth as needed for migraine. May repeat in 2 hours if needed (Patient not taking: Reported on 11/17/2016)    No facility-administered encounter medications on file as of 12/15/2016.     Allergies: No Known Allergies  Surgical History: Past Surgical History:  Procedure Laterality Date  . FRENULECTOMY, LINGUAL    . periodontal surgery      Family History:  Family History  Problem Relation Age of Onset  . Allergies Mother   . Thyroid disease Mother   . Thyroid disease Maternal Grandmother   . Arthritis Maternal Grandfather   . Arthritis Paternal Grandmother   . Heart attack Paternal Grandfather   . Cancer Neg Hx       Social History: [place] roommate [place] a [facility] [employer]  Currently in [employer] year in [employer].   Physical Exam:  Vitals:   12/15/16 1040  BP: 112/70  Pulse: 90  Weight: 123 lb (55.8 kg)  Height: 5' 3.98" (1.625 m)   BP 112/70   Pulse 90   Ht 5' 3.98" (1.625 m)   Wt 123 lb (55.8 kg)   BMI 21.13 kg/m  Body mass index: body mass index is 21.13 kg/m. Growth percentile SmartLinks can only be used for patients less than 21 years old.  Ht Readings from Last 3 Encounters:  12/15/16 5' 3.98" (1.625 m)  11/17/16 5' 3.78" (1.62 m)   11/09/16 5' 3.78" (1.62 m)   Wt Readings from Last 3 Encounters:  12/15/16 123 lb (55.8 kg)  11/17/16 122 lb 3.2 oz (55.4 kg)  11/09/16 125 lb 12.8 oz (57.1 kg)    General: Well developed, well nourished female in no acute distress. More cheerful and interactive today  Head: Normocephalic, atraumatic.   Eyes:  Pupils equal and round. EOMI.   Sclera white.  No eye drainage.   Ears/Nose/Mouth/Throat: Nares patent, no nasal drainage.  Normal dentition, mucous membranes moist.  Oropharynx intact. Neck: supple, no cervical lymphadenopathy, no thyromegaly Cardiovascular: regular rate, normal S1/S2, no murmurs Respiratory: No increased work of breathing.  Lungs clear to auscultation bilaterally.  No wheezes. Abdomen: soft, nontender, nondistended. Normal  bowel sounds.  No appreciable masses  Extremities: warm, well perfused, cap refill < 2 sec.   Musculoskeletal: Normal muscle mass.  Normal strength Skin: warm, dry.  No rash or lesions. Neurologic: alert and oriented, normal speech and gait  Labs:  Results for orders placed or performed in visit on 12/15/16  POCT Glucose (CBG)  Result Value Ref Range   POC Glucose 271 (A) 70 - 99 mg/dl    Assessment/Plan: Paula Miller is a 21 y.o. female with type 1 diabetes in poor control. She has taken some steps to improve her diabetes care since her last visit. She is still not near an optimal level of diabetes care but she is now attempting to make changes. We discussed switching to 70/30 insulin but she wants to stay with her pump.   1. DM w/o complication type I, uncontrolled (HCC) - Need to BOLUS with all meals and to correct for high blood sugars.  - Check bg at least 4 times per day.  - Discussed starting a CGM sensor.  - POCT Glucose (CBG) - POCT HgB A1C - Reviewed blood sugar log with patient.   2. Insulin pump - Continue rotate pump site  - Discussed proper use and possibly switching to shots.   3. Maladaptive health behaviors  affecting medical condition/noncompliance  - Discussed bolusing with all meals and for high blood sugars.  - Discussed life/work/diabetes balance.  - Discussed health risk associated with poor diabetes care.  - We spoke in detail about expectations at next visit. If she is not checking more consistently and bolusing more, we will put her on 70/30 insulin.    Follow-up:   2 month   Medical decision-making:  > 25 minutes spent, more than 50% of appointment was spent discussing diagnosis and management of symptoms  Gretchen Short, FNP-C

## 2016-12-15 NOTE — Patient Instructions (Signed)
Continue current pump settings  - Check blood sugar more   - Goal is 4 x per day. No days with out blood sugar  - Make sure you are bolusing with all food and to correct for elevated blood sugars   - You should be bolusing 3-4 x per day at least  - If we continue to struggle, we will look at starting 70/30 insulin

## 2017-01-07 DIAGNOSIS — M9901 Segmental and somatic dysfunction of cervical region: Secondary | ICD-10-CM | POA: Diagnosis not present

## 2017-01-07 DIAGNOSIS — G43109 Migraine with aura, not intractable, without status migrainosus: Secondary | ICD-10-CM | POA: Diagnosis not present

## 2017-01-07 DIAGNOSIS — M9902 Segmental and somatic dysfunction of thoracic region: Secondary | ICD-10-CM | POA: Diagnosis not present

## 2017-02-07 ENCOUNTER — Ambulatory Visit (INDEPENDENT_AMBULATORY_CARE_PROVIDER_SITE_OTHER): Payer: 59 | Admitting: Family

## 2017-02-07 ENCOUNTER — Encounter (INDEPENDENT_AMBULATORY_CARE_PROVIDER_SITE_OTHER): Payer: Self-pay | Admitting: Family

## 2017-02-07 VITALS — BP 90/60 | HR 108 | Ht 63.98 in | Wt 129.4 lb

## 2017-02-07 DIAGNOSIS — IMO0001 Reserved for inherently not codable concepts without codable children: Secondary | ICD-10-CM

## 2017-02-07 DIAGNOSIS — Z9641 Presence of insulin pump (external) (internal): Secondary | ICD-10-CM | POA: Diagnosis not present

## 2017-02-07 DIAGNOSIS — R000 Tachycardia, unspecified: Secondary | ICD-10-CM

## 2017-02-07 DIAGNOSIS — E1065 Type 1 diabetes mellitus with hyperglycemia: Secondary | ICD-10-CM | POA: Diagnosis not present

## 2017-02-07 DIAGNOSIS — Z9119 Patient's noncompliance with other medical treatment and regimen: Secondary | ICD-10-CM

## 2017-02-07 DIAGNOSIS — Z91199 Patient's noncompliance with other medical treatment and regimen due to unspecified reason: Secondary | ICD-10-CM

## 2017-02-07 LAB — GLUCOSE, POCT (MANUAL RESULT ENTRY): POC GLUCOSE: 254 mg/dL — AB (ref 70–99)

## 2017-02-07 LAB — POCT GLYCOSYLATED HEMOGLOBIN (HGB A1C): HEMOGLOBIN A1C: 12.5

## 2017-02-07 MED ORDER — FREESTYLE LIBRE READER DEVI: 1.0000 | Freq: Once | 1 refills | Status: AC

## 2017-02-07 MED ORDER — FREESTYLE LIBRE SENSOR SYSTEM MISC: 1.0000 | 6 refills | Status: DC | PRN

## 2017-02-07 NOTE — Patient Instructions (Addendum)
-   Continue with pump therapy for now  [facility] [place]  - Need to swipe at least 3 days a day   - Enter blood sugars into pump and give corrections  - Talk to mom about help.  - Follow up in may

## 2017-02-07 NOTE — Progress Notes (Signed)
Pediatric Endocrinology Diabetes Consultation Follow-up Visit  Ketina Mars [birth year] [id number]  Chief Complaint: Follow-up type 1 diabetes   KNAPP,EVE A, MD   HPI: Paula Miller  is a 21 y.o. female presenting for follow-up of type 1 diabetes.  1. Fama was diagnosed with new onset type 1 diabetes mellitus, dehydration, and ketonuria on 06/09/2005. She was initially treated with a multiple daily injection regimen of Lantus and Novolog insulins.  We converted her to a Medtronic insulin pump in November of 2007.  Although the patient does have a palpable goiter, she has remained euthyroid. Her CMPs, lipid panels, and urinary microalbumin to creatinine ratios have always been normal.   2. Since last visit to [facility] on 11/2016, she has been well.  No ER visits or hospitalizations.   Kevin is on spring break currently, she reports that things at school have been going well. She is still working and volunteering along with school. She states that her diabetes is "not as good as I wish but its not the worst that its been". She acknowledges that she is not checking her blood sugar frequently and is also omitting a lot of boluses. She is unsure what she needs in order to improve her diabetes care. She has been resistant in the past to allowing her mom to know that she is struggling with her care but today she states that she is going to ask her mom for help. She want to stay on her insulin pump and reports that she will not give shots.     Insulin regimen: Medtronic Insulin pump Basal Rates 12AM 0.750   4am  0.775   630am  1.45   10am  1.80   9pm  1.50    Insulin to Carbohydrate Ratio 12AM  10  6am  8  11am 10  5pm 10       Insulin Sensitivity Factor 12AM  70   6am 50  10pm 70         Target Blood Glucose 12AM  150  6am 110  11pm 150          Hypoglycemia: Able to feel low blood sugars.  No glucagon needed recently.  Blood glucose download: Checking bg 1.3 times per  day. Avg Bg 375  - She has at least 1 check every day. Most day no more then 1 check.   - She is bolusing 1.7 times per day. 3 days with 0 boluses.    - She is above range 92% of the time. Below range 0%. She is using 82% basal.  Med-alert ID: Not currently wearing. Injection sites: bilateral hips  Annual labs due: 2018 Ophthalmology due: 2018    3. ROS: Greater than 10 systems reviewed with pertinent positives listed in HPI, otherwise neg. Constitutional: Reports good energy and appetite.  Eyes: No changes in vision. Denies blurry vision. Wears glasses.  Ears/Nose/Mouth/Throat: No difficulty swallowing.   Cardiovascular: No palpitations denies chest pain  Respiratory: No increased work of breathing.  Gastrointestinal: No constipation or diarrhea.  Genitourinary: No nocturia, no polyuria Musculoskeletal: No joint pain Neurologic: Normal sensation, no tremor Endocrine: No polydipsia.  No hyperpigmentation Psychiatric: Normal affect.   Past Medical History:   Past Medical History:  Diagnosis Date  . Allergy    seasonal allergies  . Goiter   . Hypoglycemia associated with diabetes (HCC)   . Physical growth delay   . Type 1 diabetes mellitus in patient age 31-19 years with HbA1C goal below 7.5 diagnosed age  8  . VSD (ventricular septal defect)     Medications:  Outpatient Encounter Prescriptions as of 02/07/2017  Medication Sig Note  . APIDRA 100 UNIT/ML injection USE 300 UNITS IN INSULIN  PUMP EVERY 48 HOURS   . glucagon 1 MG injection Use for Severe Hypoglycemia . Inject 1mg  intramuscularly if unresponsive, unable to swallow, unconscious and/or has seizure   . glucose blood (BAYER CONTOUR NEXT TEST) test strip CHECK BLOOD SURGAR 10 TIMES DAILY   . loratadine (CLARITIN) 10 MG tablet Take 10 mg by mouth daily.   . Multiple Vitamin (MULTIVITAMIN) tablet Take 1 tablet by mouth daily.     Marland Kitchen topiramate (TOPAMAX) 25 MG tablet Take 4 tablets (100 mg total) by mouth at bedtime.   .  butalbital-acetaminophen-caffeine (FIORICET, ESGIC) 50-325-40 MG tablet Reported on 04/22/2016 04/22/2016: Prescribed recently by Dr. Vincente Poli, only took once so far  . Insulin Glargine (LANTUS SOLOSTAR) 100 UNIT/ML Solostar Pen Use up to 50 units daily if pump fails. (Patient not taking: Reported on 11/17/2016)   . insulin lispro (HUMALOG KWIKPEN) 100 UNIT/ML KiwkPen Up to 50 units/day if pump fails (Patient not taking: Reported on 11/17/2016)   . Insulin Pen Needle 31G X 5 MM MISC Check Blood glucose 10x day (Patient not taking: Reported on 02/07/2017)   . ondansetron (ZOFRAN-ODT) 4 MG disintegrating tablet Take 1 tablet (4 mg total) by mouth every 8 (eight) hours as needed for nausea or vomiting. (Patient not taking: Reported on 11/17/2016)   . rizatriptan (MAXALT-MLT) 10 MG disintegrating tablet Take 1 tablet (10 mg total) by mouth as needed for migraine. May repeat in 2 hours if needed (Patient not taking: Reported on 11/17/2016)    No facility-administered encounter medications on file as of 02/07/2017.     Allergies: No Known Allergies  Surgical History: Past Surgical History:  Procedure Laterality Date  . FRENULECTOMY, LINGUAL    . periodontal surgery      Family History:  Family History  Problem Relation Age of Onset  . Allergies Mother   . Thyroid disease Mother   . Thyroid disease Maternal Grandmother   . Arthritis Maternal Grandfather   . Arthritis Paternal Grandmother   . Heart attack Paternal Grandfather   . Cancer Neg Hx       Social History: Lives with: roommate in a house at [employer]  Currently in [employer] year in college.   Physical Exam:  Vitals:   02/07/17 0936  BP: 90/60  Pulse: (!) 108  Weight: 129 lb 6.4 oz (58.7 kg)  Height: 5' 3.98" (1.625 m)   BP 90/60   Pulse (!) 108   Ht 5' 3.98" (1.625 m)   Wt 129 lb 6.4 oz (58.7 kg)   BMI 22.23 kg/m  Body mass index: body mass index is 22.23 kg/m. Growth percentile SmartLinks can only be used for  patients less than 14 years old.  Ht Readings from Last 3 Encounters:  02/07/17 5' 3.98" (1.625 m)  12/15/16 5' 3.98" (1.625 m)  11/17/16 5' 3.78" (1.62 m)   Wt Readings from Last 3 Encounters:  02/07/17 129 lb 6.4 oz (58.7 kg)  12/15/16 123 lb (55.8 kg)  11/17/16 122 lb 3.2 oz (55.4 kg)    General: Well developed, well nourished female in no acute distress. Alert and interactive.  Head: Normocephalic, atraumatic.   Eyes:  Pupils equal and round. EOMI.   Sclera white.  No eye drainage.   Ears/Nose/Mouth/Throat: Nares patent, no nasal drainage.  Normal dentition, mucous  membranes moist.  Oropharynx intact. Neck: supple, no cervical lymphadenopathy, no thyromegaly Cardiovascular: mild tachycardia, normal rhythm, normal S1/S2, no murmurs Respiratory: No increased work of breathing.  Lungs clear to auscultation bilaterally.  No wheezes. Abdomen: soft, nontender, nondistended. Normal bowel sounds.  No appreciable masses  Extremities: warm, well perfused, cap refill < 2 sec.   Musculoskeletal: Normal muscle mass.  Normal strength Skin: warm, dry.  No rash or lesions. Neurologic: alert and oriented, normal speech and gait  Labs:  Results for orders placed or performed in visit on 02/07/17  POCT Glucose (CBG)  Result Value Ref Range   POC Glucose 254 (A) 70 - 99 mg/dl  POCT HgB [id number]  Result Value Ref Range   Hemoglobin A1C 12.5     Assessment/Plan: [name] is a 21 y.o. female with type 1 diabetes in poor control. [name] continues to struggle with noncompliance with her diabetes care. She is not checking blood sugars or giving insulin consistently. Her hemoglobin A1c continues to get worse and is now up to 12.5%.   1. DM w/o complication type I, uncontrolled (HCC) - Need to BOLUS with all meals and to correct for high blood sugars.  - Check bg at least 4 times per day.  - Start [employer] system.  - POCT Glucose (CBG) - POCT HgB A1C - Reviewed blood sugar log with patient.    2. Insulin pump - Continue rotate pump site  - Discussed proper use and possibly switching to shots. She currently refuses to use MDI  3. Maladaptive health behaviors affecting medical condition/noncompliance  - Discussed bolusing with all meals and for high blood sugars.  - Discussed life/work/diabetes balance.  - Discussed health risk associated with poor diabetes care.  Claris Che- Kadajah will speak with her mother and ask for help. She does not want to allow her mother to get her medical information though.   4. Inappropriate Sinus Tachycardia  - Slightly elevated today. Likely will improve or resolved with good diabetes control.   Follow-up:   2 months   Medical decision-making:  > 25 minutes spent, more than 50% of appointment was spent discussing diagnosis and management of symptoms  Gretchen ShortSpenser Kenika Sahm, FNP-C

## 2017-03-04 DIAGNOSIS — M9902 Segmental and somatic dysfunction of thoracic region: Secondary | ICD-10-CM | POA: Diagnosis not present

## 2017-03-04 DIAGNOSIS — M9901 Segmental and somatic dysfunction of cervical region: Secondary | ICD-10-CM | POA: Diagnosis not present

## 2017-03-04 DIAGNOSIS — G43109 Migraine with aura, not intractable, without status migrainosus: Secondary | ICD-10-CM | POA: Diagnosis not present

## 2017-04-01 DIAGNOSIS — M9901 Segmental and somatic dysfunction of cervical region: Secondary | ICD-10-CM | POA: Diagnosis not present

## 2017-04-01 DIAGNOSIS — G43109 Migraine with aura, not intractable, without status migrainosus: Secondary | ICD-10-CM | POA: Diagnosis not present

## 2017-04-01 DIAGNOSIS — M9902 Segmental and somatic dysfunction of thoracic region: Secondary | ICD-10-CM | POA: Diagnosis not present

## 2017-04-04 ENCOUNTER — Ambulatory Visit (INDEPENDENT_AMBULATORY_CARE_PROVIDER_SITE_OTHER): Payer: 59 | Admitting: Family

## 2017-04-04 ENCOUNTER — Encounter (INDEPENDENT_AMBULATORY_CARE_PROVIDER_SITE_OTHER): Payer: Self-pay | Admitting: Family

## 2017-04-04 VITALS — BP 122/70 | HR 90 | Wt 128.4 lb

## 2017-04-04 DIAGNOSIS — E1065 Type 1 diabetes mellitus with hyperglycemia: Secondary | ICD-10-CM | POA: Diagnosis not present

## 2017-04-04 DIAGNOSIS — Z9119 Patient's noncompliance with other medical treatment and regimen: Secondary | ICD-10-CM | POA: Diagnosis not present

## 2017-04-04 DIAGNOSIS — Z9641 Presence of insulin pump (external) (internal): Secondary | ICD-10-CM | POA: Diagnosis not present

## 2017-04-04 DIAGNOSIS — Z91199 Patient's noncompliance with other medical treatment and regimen due to unspecified reason: Secondary | ICD-10-CM

## 2017-04-04 DIAGNOSIS — F54 Psychological and behavioral factors associated with disorders or diseases classified elsewhere: Secondary | ICD-10-CM | POA: Diagnosis not present

## 2017-04-04 DIAGNOSIS — IMO0001 Reserved for inherently not codable concepts without codable children: Secondary | ICD-10-CM

## 2017-04-04 LAB — POCT GLUCOSE (DEVICE FOR HOME USE): POC GLUCOSE: 291 mg/dL — AB (ref 70–99)

## 2017-04-04 NOTE — Patient Instructions (Addendum)
-   Continue current pump settings  - Continue libre  - If by next visit, no bolusing at least two times per day, will look at switching to 70/30  - FOllow up in one month

## 2017-04-04 NOTE — Progress Notes (Signed)
Pediatric Endocrinology Diabetes Consultation Follow-up Visit  Stepanie Graver 1996/08/08 [id number]  Chief Complaint: Follow-up type 1 diabetes   Rita Ohara, MD   HPI: Paula Miller  is a 21 y.o. female presenting for follow-up of type 1 diabetes.  1. Aicia was diagnosed with new onset type 1 diabetes mellitus, dehydration, and ketonuria on 06/09/2005. She was initially treated with a multiple daily injection regimen of Lantus and Novolog insulins.  We converted her to a Medtronic insulin pump in November of 2007.  Although the patient does have a palpable goiter, she has remained euthyroid. Her CMPs, lipid panels, and urinary microalbumin to creatinine ratios have always been normal.   2. Since last visit to [facility] on 01/2017, she has been well.  No ER visits or hospitalizations.   Ryan has been busy with school. She is stressed this week because she has exams starting and she wants to keep her GPA up. She feels like her diabetes care has been about the same. She is wearing the [employer] now and finds that it is much easier for her to scan it then check her blood sugars. She is annoyed that sometimes it will not stay stuck to her so she has to put extra tape on it. She continues to wear a Medtronic 530g insulin pump. She estimates she changes her site once ever 4-5 days. She acknowledges that she frequently misses boluses when she eats.     Insulin regimen: Medtronic Insulin pump Basal Rates 12AM 0.750   4am  0.775   630am  1.45   10am  1.80   9pm  1.50    Insulin to Carbohydrate Ratio 12AM  10  6am  8  11am 10  5pm 10       Insulin Sensitivity Factor 12AM  70   6am 50  10pm 70         Target Blood Glucose 12AM  150  6am 110  11pm 150          Hypoglycemia: Able to feel low blood sugars.  No glucagon needed recently.  Blood glucose download: Checking bg 1.1 times per day. Avg Bg 429  - She is using Freestyle libre now more then testing.   - She is  bolusing 0.9 times per day. 8 days with 0 boluses in the last month.     - She is above range 97% of the time. Below range 0%. She is using 79% basal.  Freestyle Libre CGM: Avg Bg 358. Scanning 2-5 times per day  - In range 10%, Above range 89%. Below range 1%.  Med-alert ID: Not currently wearing. Injection sites: bilateral hips  Annual labs due: 2018 Ophthalmology due: 2018    3. ROS: Greater than 10 systems reviewed with pertinent positives listed in HPI, otherwise neg. Constitutional: Reports good energy and appetite.  Eyes: No changes in vision. Denies blurry vision. Wears glasses.  Ears/Nose/Mouth/Throat: No difficulty swallowing.   Cardiovascular: No palpitations denies chest pain  Respiratory: No increased work of breathing.  Gastrointestinal: No constipation or diarrhea.  Genitourinary: No nocturia, no polyuria Musculoskeletal: No joint pain Neurologic: Normal sensation, no tremor Endocrine: No polydipsia.  No hyperpigmentation Psychiatric: Normal affect.   Past Medical History:   Past Medical History:  Diagnosis Date  . Allergy    seasonal allergies  . Goiter   . Hypoglycemia associated with diabetes ([place])   . Physical growth delay   . Type 1 diabetes mellitus in patient age 24-19 years with HbA1C goal  below 7.5 diagnosed age 86  . VSD (ventricular septal defect)     Medications:  Outpatient Encounter Prescriptions as of 04/04/2017  Medication Sig Note  . APIDRA 100 UNIT/ML injection USE 300 UNITS IN INSULIN  PUMP EVERY 48 HOURS   . Continuous Blood Gluc Sensor (FREESTYLE LIBRE SENSOR SYSTEM) MISC 1 kit by Does not apply route as needed.   Marland Kitchen glucagon 1 MG injection Use for Severe Hypoglycemia . Inject 88m intramuscularly if unresponsive, unable to swallow, unconscious and/or has seizure   . glucose blood (BAYER CONTOUR NEXT TEST) test strip CHECK BLOOD SURGAR 10 TIMES DAILY   . Multiple Vitamin (MULTIVITAMIN) tablet Take 1 tablet by mouth daily.     .Marland Kitchentopiramate  (TOPAMAX) 25 MG tablet Take 4 tablets (100 mg total) by mouth at bedtime.   . butalbital-acetaminophen-caffeine (FIORICET, ESGIC) 50-325-40 MG tablet Reported on 04/22/2016 04/22/2016: Prescribed recently by Dr. GHelane Rima only took once so far  . Insulin Glargine (LANTUS SOLOSTAR) 100 UNIT/ML Solostar Pen Use up to 50 units daily if pump fails. (Patient not taking: Reported on 11/17/2016)   . insulin lispro (HUMALOG KWIKPEN) 100 UNIT/ML KiwkPen Up to 50 units/day if pump fails (Patient not taking: Reported on 11/17/2016)   . Insulin Pen Needle 31G X 5 MM MISC Check Blood glucose 10x day (Patient not taking: Reported on 02/07/2017)   . loratadine (CLARITIN) 10 MG tablet Take 10 mg by mouth daily.   . ondansetron (ZOFRAN-ODT) 4 MG disintegrating tablet Take 1 tablet (4 mg total) by mouth every 8 (eight) hours as needed for nausea or vomiting. (Patient not taking: Reported on 11/17/2016)   . rizatriptan (MAXALT-MLT) 10 MG disintegrating tablet Take 1 tablet (10 mg total) by mouth as needed for migraine. May repeat in 2 hours if needed (Patient not taking: Reported on 11/17/2016)    No facility-administered encounter medications on file as of 04/04/2017.     Allergies: No Known Allergies  Surgical History: Past Surgical History:  Procedure Laterality Date  . FRENULECTOMY, LINGUAL    . periodontal surgery      Family History:  Family History  Problem Relation Age of Onset  . Allergies Mother   . Thyroid disease Mother   . Thyroid disease Maternal Grandmother   . Arthritis Maternal Grandfather   . Arthritis Paternal Grandmother   . Heart attack Paternal Grandfather   . Cancer Neg Hx       Social History: Lives with: roommate [place] [employer] Currently in [employer] in [place].   Physical Exam:  Vitals:   04/04/17 0903  BP: 122/70  Pulse: 90  Weight: 128 lb 6.4 oz (58.2 kg)   BP 122/70   Pulse 90   Wt 128 lb 6.4 oz (58.2 kg)   BMI 22.06 kg/m  Body mass index:  body mass index is 22.06 kg/m. Growth percentile SmartLinks can only be used for patients less than 237years old.  Ht Readings from Last 3 Encounters:  02/07/17 5' 3.98" (1.625 m)  12/15/16 5' 3.98" (1.625 m)  11/17/16 5' 3.78" (1.62 m)   Wt Readings from Last 3 Encounters:  04/04/17 128 lb 6.4 oz (58.2 kg)  02/07/17 129 lb 6.4 oz (58.7 kg)  12/15/16 123 lb (55.8 kg)    General: Well developed, well nourished female in no acute distress. Alert and interactive. She is reserved today.  Head: Normocephalic, atraumatic.   Eyes:  Pupils equal and round. EOMI.   Sclera white.  No  eye drainage.   Ears/Nose/Mouth/Throat: Nares patent, no nasal drainage.  Normal dentition, mucous membranes moist.  Oropharynx intact. Neck: supple, no cervical lymphadenopathy, no thyromegaly Cardiovascular: normal rate, normal rhythm, normal S1/S2, no murmurs Respiratory: No increased work of breathing.  Lungs clear to auscultation bilaterally.  No wheezes. Abdomen: soft, nontender, nondistended. Normal bowel sounds.  No appreciable masses  Extremities: warm, well perfused, cap refill < 2 sec.   Musculoskeletal: Normal muscle mass.  Normal strength Skin: warm, dry.  No rash or lesions. Neurologic: alert and oriented, normal speech and gait  Labs:  Results for orders placed or performed in visit on 04/04/17  POCT Glucose (Device for Home Use)  Result Value Ref Range   Glucose Fasting, POC  70 - 99 mg/dL   POC Glucose 291 (A) 70 - 99 mg/dl    Assessment/Plan: Kemper is a 21 y.o. female with type 1 diabetes in poor control. Kaisyn is missing more boluses now and her noncompliance has not improved. She is doing better with blood sugar readings with the [employer]. However, even when she knows her blood sugars, she is not giving appropriate insulin. She continues to refuse taking a pump vacation and using shots.  1. DM w/o complication type I, uncontrolled ([place]) - Need to BOLUS with all meals and to  correct for high blood sugars.  - Check bg at least 4 times per day.  - Start [employer] system.  - POCT Glucose (CBG) - POCT HgB A1C - Reviewed blood sugar log with patient.   2. Insulin pump - Continue rotate pump site  - Discussed proper use and possibly switching to shots. She currently refuses to use MDI  3. Maladaptive health behaviors affecting medical condition/noncompliance  - Discussed bolusing with all meals and for high blood sugars.  - Discussed life/work/diabetes balance.  - Discussed health risk associated with poor diabetes care.  - If she does not have an average of 2 boluses per day at next visit, we will discuss switching to 70/30 insulin.    4. Inappropriate Sinus Tachycardia  - Normal heart rate today. Likely will improve or resolved with good diabetes control.   Follow-up:   1 months   Medical decision-making:  > 25 minutes spent, more than 50% of appointment was spent discussing diagnosis and management of symptoms  Hermenia Bers, FNP-C

## 2017-04-11 IMAGING — DX DG CHEST 1V PORT
1 series · 1 of 1 positions shown · non-contrast
Comparison: None.

CLINICAL DATA: Hyperglycemia today.

EXAM:
PORTABLE CHEST 1 VIEW

[chest ap]
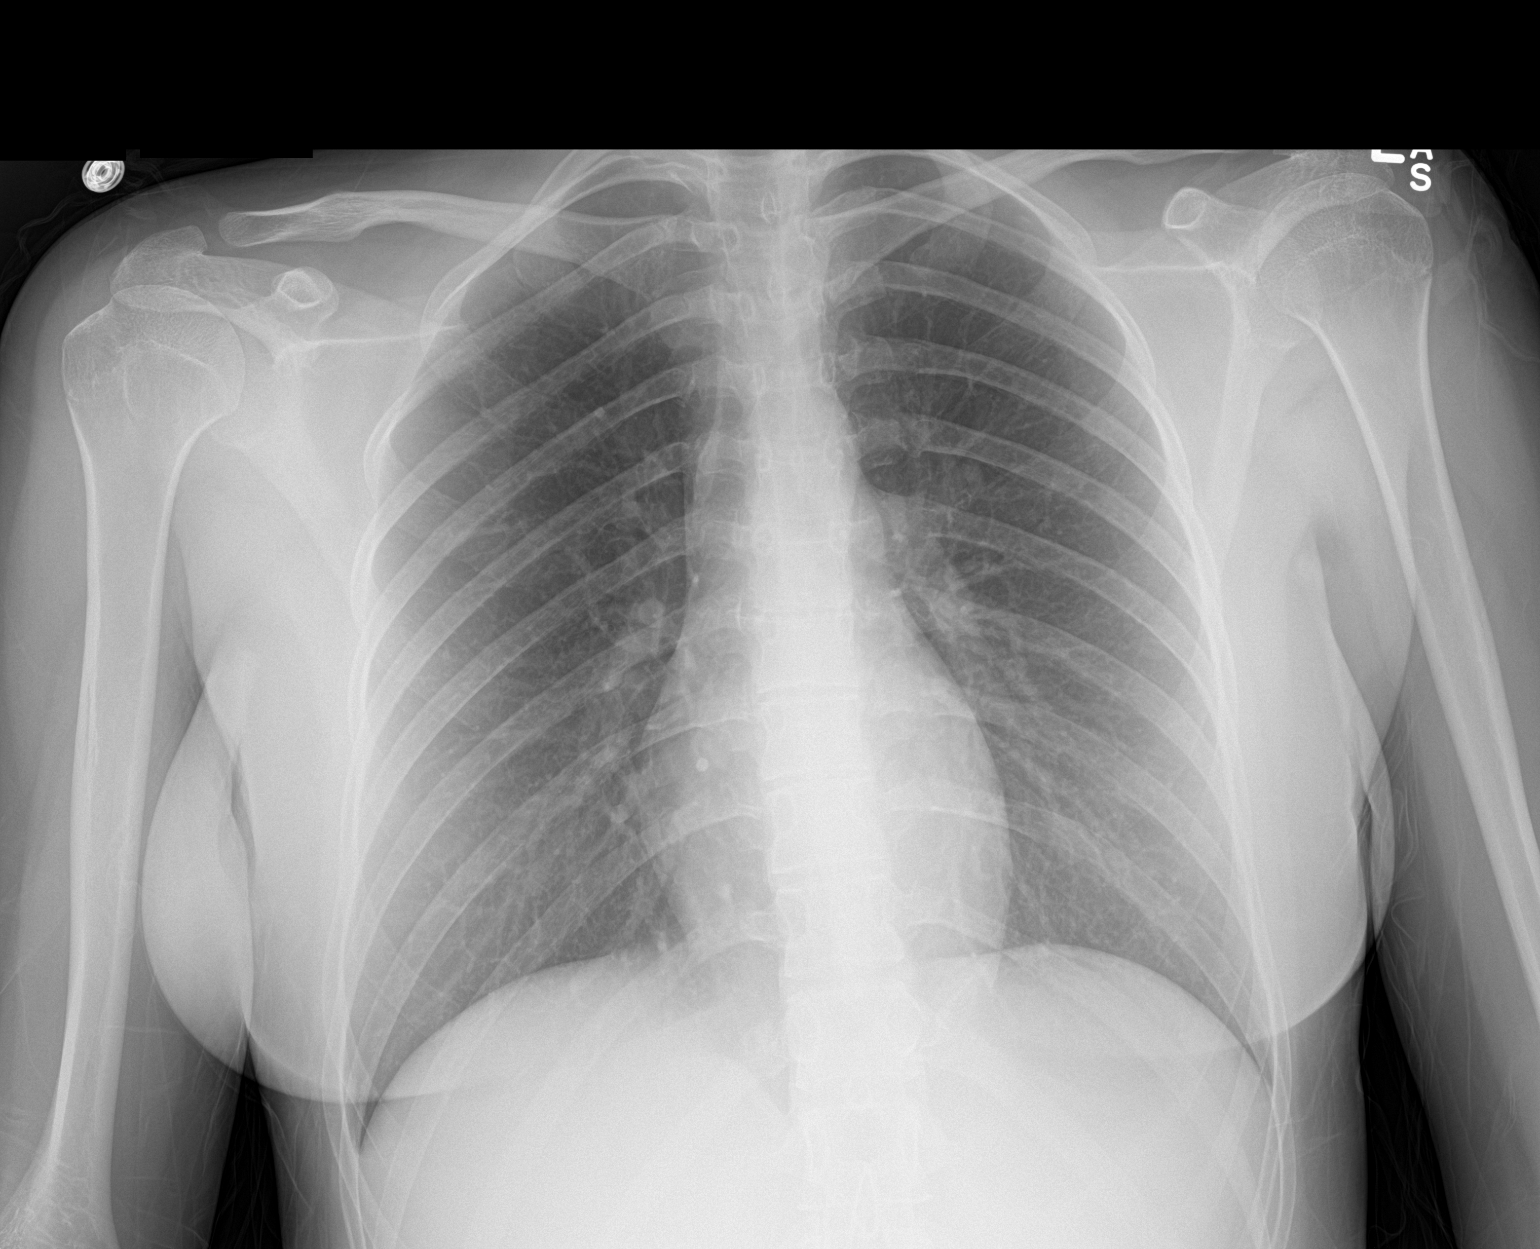

[1 of 1 positions shown; findings below may reference images not displayed]

FINDINGS: The cardiomediastinal contours are normal. The lungs are clear.
Pulmonary vasculature is normal. No consolidation, pleural effusion,
or pneumothorax. No acute osseous abnormalities are seen.
IMPRESSION: Normal chest radiograph.

## 2017-04-27 NOTE — Progress Notes (Signed)
Chief Complaint  Patient presents with  . Annual Exam    fasting annual exam, no pap sees Dr Helane Rima. Did not do eye exam sees Dr Annamaria Boots and is UTD. Is having migraines more frequency, she thinks anxiety in contributing-sees Dr Richardo Hanks at [facility].     Paula Miller is a 21 y.o. female who presents for a complete physical.  She has the following concerns:  Congestion/allergies.  Oral antihistamines aren't too effective, has tried different ones. Hasn't used nasal steroids yet.  Diabetes: 1 diabetes in poor control. H/o not giving enough boluses or checking sugars often enough. She is doing better with blood sugar readings with the [facility], per last endocrine note.  She states A1c was checked again at her last visit last month and was 11.5 (not entered in system, not billed/covered)  Infrequent hypoglycemia. Checks feet regularly, denies concerns. Denies numbness, tingling, burning in feet. Sees ophtho once yearly, last in December. Lab Results  Component Value Date   HGBA1C 12.5 02/07/2017   Chronic migraines without aura: last saw neuro in December. She is on topamax and maxalt prn.  Dose was titrated up from 20m at her last visit to 1026m She is having more migraines recently, which she relates to increased anxiety.  She is taking GRE this month, some anxiety related to roommate issue. Poor control of DM was felt to contribute some also to her headaches. Uses fioricet prn. The triptan given by the neurologist caused dizziness.  Sees GYN. Hasn't had pap yet or STD screening; denies risk or desire for screening today. No vaginal discharge. Took OCP's for about a year in the past when she had heavy cycles. No longer has heavy periods.  GYN (at [facility]) has previously prescribed Belviq to help with the weight loss, started in 11/2015. This was stopped when she went on Topamax for migraines.   Immunization History  Administered Date(s) Administered  . DTaP 10/23/1996, 12/25/1996,  02/26/1997, 03/12/1998, 05/10/2001  . Hepatitis A 01/24/2006, 03/10/2007  . Hepatitis B 10/23/1996, 01/04/1997, 06/09/1997  . HiB (PRP-OMP) 10/23/1996, 12/25/1996, 02/26/1997, 12/11/1997  . IPV 10/23/1996, 12/25/1996, 03/12/1998, 05/10/2001  . Influenza Split 08/22/2016  . Influenza,inj,Quad PF,36+ Mos 08/08/2013  . Influenza-Unspecified 09/19/2002, 09/15/2005, 09/13/2008, 07/15/2011  . MMR 12/11/1997, 05/10/2001  . Meningococcal B, OMV 05/07/2016, 06/23/2016  . Meningococcal Polysaccharide 02/13/2009, 05/24/2014  . Tdap 05/10/2007  . Varicella 09/16/1997   Hasn't had HPV vaccines. Mother does not want her to have and she reiterated this to her recently. We also previously discussed meningitis B vaccine, which she didn't return for. NCIR was reviewed, and Varicella was marked as "immune" Last Pap smear: never Dentist: twice yearly Ophtho: yearly, last 10/2016 (Dr. YoAnnamaria BootsExercise: Walks on campus a lot (for her job, and classes). Walks the dog on Sundays with her dad.   Works at [employer] on Sundays. Milk:  Lactose intolerant.  Uses lactaid in her cereal. She takes calcium supplement once daily, plus MVI.  Past Medical History:  Diagnosis Date  . Allergy    seasonal allergies  . Goiter   . Hypoglycemia associated with diabetes ([place]  . Physical growth delay   . Type 1 diabetes mellitus in patient age 21-19ears with HbA1C goal below 7.5 diagnosed age [age]. VSD (ventricular septal defect)     Past Surgical History:  Procedure Laterality Date  . FRENULECTOMY, LINGUAL    . periodontal surgery      Social History   Social  History  . Marital status: Single    Spouse name: N/A  . Number of children: 0  . Years of education: 12+   Occupational History  . StudentTyler Deis    Social History Main Topics  . Smoking status: Never Smoker  . Smokeless tobacco: Never Used  . Alcohol use No  . Drug use: No  . Sexual activity: Not Currently    Partners: Male     Birth control/ protection: Condom   Other Topics Concern  . Not on file   Social History Narrative   Ship broker at [employer], [employer].   Parents live in [place], 1 younger brother.    (mother--[name] Rawl, grandparents are the [place])   1 dog at home   Works in [employer] office at [employer] (summer and during school year)      Caffeine use: daily    Family History  Problem Relation Age of Onset  . Allergies Mother   . Thyroid disease Mother   . Thyroid disease Maternal Grandmother   . Arthritis Maternal Grandfather   . Arthritis Paternal Grandmother   . Heart attack Paternal Grandfather   . Cancer Neg Hx     Outpatient Encounter Prescriptions as of 04/28/2017  Medication Sig Note  . APIDRA 100 UNIT/ML injection USE 300 UNITS IN INSULIN  PUMP EVERY 48 HOURS   . butalbital-acetaminophen-caffeine (FIORICET, ESGIC) 50-325-40 MG tablet Reported on 04/22/2016 04/28/2017: Uses prn migraines, last taken last week  . Calcium Carb-Cholecalciferol (CALCIUM-VITAMIN D) 500-200 MG-UNIT tablet Take 1 tablet by mouth daily. 04/28/2017: Unsure of dose. Target brand.  Takes one daily  . Continuous Blood Gluc Sensor (FREESTYLE LIBRE SENSOR SYSTEM) MISC 1 kit by Does not apply route as needed.   Marland Kitchen glucose blood (BAYER CONTOUR NEXT TEST) test strip CHECK BLOOD SURGAR 10 TIMES DAILY   . Insulin Pen Needle 31G X 5 MM MISC Check Blood glucose 10x day   . loratadine (CLARITIN) 10 MG tablet Take 10 mg by mouth daily.   . Multiple Vitamin (MULTIVITAMIN) tablet Take 1 tablet by mouth daily.     Marland Kitchen topiramate (TOPAMAX) 25 MG tablet Take 4 tablets (100 mg total) by mouth at bedtime.   Marland Kitchen glucagon 1 MG injection Use for Severe Hypoglycemia . Inject 59m intramuscularly if unresponsive, unable to swallow, unconscious and/or has seizure (Patient not taking: Reported on 04/28/2017)   . Insulin Glargine (LANTUS SOLOSTAR) 100 UNIT/ML Solostar Pen Use up to 50 units daily if pump fails. (Patient not taking: Reported on  04/28/2017) 04/28/2017: Uses only for pump failure  . insulin lispro (HUMALOG KWIKPEN) 100 UNIT/ML KiwkPen Up to 50 units/day if pump fails (Patient not taking: Reported on 04/28/2017)   . ondansetron (ZOFRAN-ODT) 4 MG disintegrating tablet Take 1 tablet (4 mg total) by mouth every 8 (eight) hours as needed for nausea or vomiting. (Patient not taking: Reported on 11/17/2016)   . Vitamin D, Ergocalciferol, (DRISDOL) 50000 units CAPS capsule Take 1 capsule (50,000 Units total) by mouth every 7 (seven) days.   . [DISCONTINUED] rizatriptan (MAXALT-MLT) 10 MG disintegrating tablet Take 1 tablet (10 mg total) by mouth as needed for migraine. May repeat in 2 hours if needed (Patient not taking: Reported on 11/17/2016)    No facility-administered encounter medications on file as of 04/28/2017.     No Known Allergies  ROS: The patient denies anorexia, fever, weight changes, vision changes, decreased hearing, ear pain, sore throat, breast concerns, chest pain, palpitations, dizziness, syncope, dyspnea on exertion, cough, swelling,  nausea, vomiting, diarrhea, constipation, abdominal pain, melena, hematochezia, indigestion/heartburn, hematuria, incontinence, dysuria, irregular menstrual cycles, vaginal discharge, odor or itch, genital lesions, joint pains, numbness, tingling, weakness, tremor, suspicious skin lesions, abnormal bleeding/bruising, or enlarged lymph nodes. +Anxiety per HPI.  No insomnia. Congestion/sinuses fairly chronically, not relieved by oral antihistaines, per HPI. No polydipsia, polyuria    PHYSICAL EXAM:  BP 106/60 (BP Location: Right Arm, Patient Position: Sitting, Cuff Size: Normal)   Pulse 100   Ht _0  (1.626 m)   Wt 128 lb 9.6 oz (58.3 kg)   LMP 04/12/2017 (Exact Date)   BMI 22.07 kg/m   Pulse in 80's-90 range during MD exam.   Wt Readings from Last 3 Encounters:  04/04/17 128 lb 6.4 oz (58.2 kg)  02/07/17 129 lb 6.4 oz (58.7 kg)  12/15/16 123 lb (55.8 kg)    General  Appearance:    Alert, cooperative, no distress, appears stated age  Head:    Normocephalic, without obvious abnormality, atraumatic  Eyes:    PERRL, conjunctiva/corneas clear, EOM's intact, fundi benign  Ears:    Normal TM's and external ear canals  Nose:   Nares normal, mucosa is mildly edematous, no erythema, drainage or sinus tenderness  Throat:   Lips, mucosa, and tongue normal; teeth and gums normal.  Neck:   Supple, no lymphadenopathy;  thyroid:  no   enlargement/tenderness/nodules; no carotid bruit or JVD  Back:    Spine nontender, no curvature, ROM normal, no CVA tenderness  Lungs:     Clear to auscultation bilaterally without wheezes, rales or ronchi; respirations unlabored  Chest Wall:    No tenderness or deformity   Heart:    Regular rate and rhythm, S1 and S2 normal, no murmur, rub or gallop  Breast Exam:    Deferred to GYN (declined today)  Abdomen:     Soft, non-tender, nondistended, normoactive bowel sounds,    no masses, no hepatosplenomegaly  Genitalia:    deferred to GYN--reminded that she will need pap smear age 40  Rectal:    Not performed  Extremities:   No clubbing, cyanosis or edema. Normal monofilament exam  Pulses:   2+ and symmetric all extremities  Skin:   Skin color, texture, turgor normal, no rashes.  Lymph nodes:   Cervical, supraclavicular, and inguinal nodes normal  Neurologic:   CNII-XII intact, normal strength, sensation and gait; reflexes 2+ and symmetric throughout                                  Psych:   anxious.  Became tearful in discussing labs needed (reports blood draws are painful, got tearful/anxious).  Otherwise affect was fairly flat. Normal eye contact, speech, hygiene and grooming.                  ASSESSMENT/PLAN:  Annual physical exam - Plan: Lipid panel, Comprehensive metabolic panel, CBC with Differential/Platelet, VITAMIN D 25 Hydroxy (Vit-D Deficiency, Fractures), TSH, POCT Urinalysis Dipstick  Uncontrolled type 1 diabetes mellitus  with diabetic autonomic neuropathy ([place]) - encouraged compliance, briefly reviewed longterm risks of uncontrolled DM. f/u as scheduled with endo - Plan: Lipid panel, Comprehensive metabolic panel, TSH, Microalbumin / creatinine urine ratio  Migraine without aura and without status migrainosus, not intractable - had improved with topamax, more frequent recently related to stress/anxiety  Immunization due - Plan: Td : Tetanus/diphtheria >7yo Preservative  free, CANCELED: Tdap vaccine greater than or equal  to 7yo IM  Anxiety - encouraged communication with roommate; counseling recommended over summer (for current issues and also related to chronic illness, poorly controlled)   Discussed monthly self breast exams, at least 30 minutes of aerobic activity at least 5 days/week, weight bearing exercise at least 2x/week; proper sunscreen use reviewed; healthy diet, including goals of calcium and vitamin D intake reviewed; regular seatbelt use; changing batteries in smoke detectors. Immunization recommendations discussed--refuses Gardisil series (per mother's request, who isn't here with her to discuss). Discussed men B in detail-- mother not here, previously recommended.  At this point, will skip (living in a house, smaller classes as a senior, less risk/exposure). Td booster given today   CBC, c-met, lipid, TSH, u/a and microalbumin, vit D Declines HIV test  F/u 1 year, sooner prn. Continue regular f/u with endo. Strongly encouraged counseling, especially when she has more time over the summer, and has ongoing issues with roommate.

## 2017-04-28 ENCOUNTER — Encounter: Payer: Self-pay | Admitting: Family Medicine

## 2017-04-28 ENCOUNTER — Ambulatory Visit (INDEPENDENT_AMBULATORY_CARE_PROVIDER_SITE_OTHER): Payer: 59 | Admitting: Family Medicine

## 2017-04-28 VITALS — BP 106/60 | HR 100 | Ht 64.0 in | Wt 128.6 lb

## 2017-04-28 DIAGNOSIS — Z0000 Encounter for general adult medical examination without abnormal findings: Secondary | ICD-10-CM | POA: Diagnosis not present

## 2017-04-28 DIAGNOSIS — E559 Vitamin D deficiency, unspecified: Secondary | ICD-10-CM | POA: Diagnosis not present

## 2017-04-28 DIAGNOSIS — F419 Anxiety disorder, unspecified: Secondary | ICD-10-CM

## 2017-04-28 DIAGNOSIS — J302 Other seasonal allergic rhinitis: Secondary | ICD-10-CM

## 2017-04-28 DIAGNOSIS — E1065 Type 1 diabetes mellitus with hyperglycemia: Secondary | ICD-10-CM

## 2017-04-28 DIAGNOSIS — Z23 Encounter for immunization: Secondary | ICD-10-CM

## 2017-04-28 DIAGNOSIS — IMO0002 Reserved for concepts with insufficient information to code with codable children: Secondary | ICD-10-CM

## 2017-04-28 DIAGNOSIS — E1043 Type 1 diabetes mellitus with diabetic autonomic (poly)neuropathy: Secondary | ICD-10-CM

## 2017-04-28 DIAGNOSIS — G43009 Migraine without aura, not intractable, without status migrainosus: Secondary | ICD-10-CM | POA: Diagnosis not present

## 2017-04-28 LAB — COMPREHENSIVE METABOLIC PANEL
ALT: 17 U/L (ref 6–29)
AST: 14 U/L (ref 10–30)
Albumin: 4 g/dL (ref 3.6–5.1)
Alkaline Phosphatase: 84 U/L (ref 33–115)
BILIRUBIN TOTAL: 0.4 mg/dL (ref 0.2–1.2)
BUN: 15 mg/dL (ref 7–25)
CHLORIDE: 103 mmol/L (ref 98–110)
CO2: 23 mmol/L (ref 20–31)
CREATININE: 0.7 mg/dL (ref 0.50–1.10)
Calcium: 8.9 mg/dL (ref 8.6–10.2)
Glucose, Bld: 292 mg/dL — ABNORMAL HIGH (ref 65–99)
Potassium: 4.1 mmol/L (ref 3.5–5.3)
SODIUM: 136 mmol/L (ref 135–146)
TOTAL PROTEIN: 7 g/dL (ref 6.1–8.1)

## 2017-04-28 LAB — LIPID PANEL
Cholesterol: 160 mg/dL (ref <200)
HDL: 64 mg/dL (ref >50)
LDL CALC: 67 mg/dL (ref <100)
Total CHOL/HDL Ratio: 2.5 Ratio (ref <5.0)
Triglycerides: 144 mg/dL (ref <150)
VLDL: 29 mg/dL (ref <30)

## 2017-04-28 LAB — POCT URINALYSIS DIPSTICK
Bilirubin, UA: NEGATIVE
Blood, UA: NEGATIVE
LEUKOCYTES UA: NEGATIVE
Nitrite, UA: NEGATIVE
PROTEIN UA: NEGATIVE
Spec Grav, UA: >=1.03 — AB (ref 1.010–1.025)
UROBILINOGEN UA: NEGATIVE U/dL — AB
pH, UA: 6 (ref 5.0–8.0)

## 2017-04-28 LAB — CBC WITH DIFFERENTIAL/PLATELET
Basophils Absolute: 90 cells/uL (ref 0–200)
Basophils Relative: 1 %
Eosinophils Absolute: 180 cells/uL (ref 15–500)
Eosinophils Relative: 2 %
HEMATOCRIT: 45.2 % — AB (ref 35.0–45.0)
Hemoglobin: 14.5 g/dL (ref 11.7–15.5)
LYMPHS PCT: 23 %
Lymphs Abs: 2070 cells/uL (ref 850–3900)
MCH: 28.8 pg (ref 27.0–33.0)
MCHC: 32.1 g/dL (ref 32.0–36.0)
MCV: 89.7 fL (ref 80.0–100.0)
MONO ABS: 540 {cells}/uL (ref 200–950)
MONOS PCT: 6 %
MPV: 10.4 fL (ref 7.5–12.5)
NEUTROS PCT: 68 %
Neutro Abs: 6120 cells/uL (ref 1500–7800)
Platelets: 314 10*3/uL (ref 140–400)
RBC: 5.04 MIL/uL (ref 3.80–5.10)
RDW: 12.8 % (ref 11.0–15.0)
WBC: 9 10*3/uL (ref 4.0–10.5)

## 2017-04-28 NOTE — Patient Instructions (Signed)
  HEALTH MAINTENANCE RECOMMENDATIONS:  It is recommended that you get at least 30 minutes of aerobic exercise at least 5 days/week (for weight loss, you may need as much as 60-90 minutes). This can be any activity that gets your heart rate up. This can be divided in 10-15 minute intervals if needed, but try and build up your endurance at least once a week.  Weight bearing exercise is also recommended twice weekly.  Eat a healthy diet with lots of vegetables, fruits and fiber.  "Colorful" foods have a lot of vitamins (ie green vegetables, tomatoes, red peppers, etc).  Limit sweet tea, regular sodas, all of which has a lot of calories and sugar. Drink a lot of water.  Calcium recommendations are 1200-1500 mg daily (1500 mg for postmenopausal women or women without ovaries), and vitamin D 1000 IU daily.  This should be obtained from diet and/or supplements (vitamins), and calcium should not be taken all at once, but in divided doses.  Monthly self breast exams and yearly mammograms for women over the age of 21 is recommended.  Sunscreen of at least SPF 30 should be used on all sun-exposed parts of the skin when outside between the hours of 10 am and 4 pm (not just when at beach or pool, but even with exercise, golf, tennis, and yard work!)  Use a sunscreen that says "broad spectrum" so it covers both UVA and UVB rays, and make sure to reapply every 1-2 hours.  Remember to change the batteries in your smoke detectors when changing your clock times in the spring and fall.  Use your seat belt every time you are in a car, and please drive safely and not be distracted with cell phones and texting while driving.  We gave you a tetanus booster today.  Consider getting counseling through school to help with anxiety and roommate issues. I recommend doing this now, while you have more time over the summer.  They can then help you assess how you're doing, and whether or not medications might be needed.  Start  with the counseling. We also discussed opening lines of communication with your roommate.

## 2017-04-29 DIAGNOSIS — G43109 Migraine with aura, not intractable, without status migrainosus: Secondary | ICD-10-CM | POA: Diagnosis not present

## 2017-04-29 DIAGNOSIS — M9901 Segmental and somatic dysfunction of cervical region: Secondary | ICD-10-CM | POA: Diagnosis not present

## 2017-04-29 DIAGNOSIS — M9902 Segmental and somatic dysfunction of thoracic region: Secondary | ICD-10-CM | POA: Diagnosis not present

## 2017-04-29 LAB — MICROALBUMIN / CREATININE URINE RATIO
Creatinine, Urine: 169 mg/dL (ref 20–320)
Microalb Creat Ratio: 2 mcg/mg creat (ref <30)
Microalb, Ur: 0.4 mg/dL

## 2017-04-29 LAB — VITAMIN D 25 HYDROXY (VIT D DEFICIENCY, FRACTURES): Vit D, 25-Hydroxy: 25 ng/mL — ABNORMAL LOW (ref 30–100)

## 2017-04-29 LAB — TSH: TSH: 1.31 mIU/L

## 2017-04-29 MED ORDER — VITAMIN D (ERGOCALCIFEROL) 1.25 MG (50000 UNIT) PO CAPS: 50000.0000 [IU] | ORAL_CAPSULE | ORAL | 0 refills | Status: DC

## 2017-05-11 ENCOUNTER — Ambulatory Visit: Payer: 59 | Admitting: Neurology

## 2017-05-12 ENCOUNTER — Ambulatory Visit (INDEPENDENT_AMBULATORY_CARE_PROVIDER_SITE_OTHER): Payer: 59 | Admitting: Family

## 2017-05-12 ENCOUNTER — Encounter (INDEPENDENT_AMBULATORY_CARE_PROVIDER_SITE_OTHER): Payer: Self-pay | Admitting: Family

## 2017-05-12 VITALS — BP 112/70 | HR 112 | Wt 128.4 lb

## 2017-05-12 DIAGNOSIS — Z9119 Patient's noncompliance with other medical treatment and regimen: Secondary | ICD-10-CM | POA: Diagnosis not present

## 2017-05-12 DIAGNOSIS — E1065 Type 1 diabetes mellitus with hyperglycemia: Secondary | ICD-10-CM

## 2017-05-12 DIAGNOSIS — R000 Tachycardia, unspecified: Secondary | ICD-10-CM

## 2017-05-12 DIAGNOSIS — Z4681 Encounter for fitting and adjustment of insulin pump: Secondary | ICD-10-CM

## 2017-05-12 DIAGNOSIS — Z91199 Patient's noncompliance with other medical treatment and regimen due to unspecified reason: Secondary | ICD-10-CM

## 2017-05-12 DIAGNOSIS — IMO0001 Reserved for inherently not codable concepts without codable children: Secondary | ICD-10-CM

## 2017-05-12 LAB — POCT GLUCOSE (DEVICE FOR HOME USE): POC GLUCOSE: 301 mg/dL — AB (ref 70–99)

## 2017-05-12 LAB — POCT GLYCOSYLATED HEMOGLOBIN (HGB A1C): HEMOGLOBIN A1C: 10.5

## 2017-05-12 NOTE — Progress Notes (Signed)
Pediatric Endocrinology Diabetes Consultation Follow-up Visit  Paula Miller 05-03-1996 [id number]  Chief Complaint: Follow-up type 1 diabetes   Rita Ohara, MD   HPI: Paula Miller  is a 21 y.o. female presenting for follow-up of type 1 diabetes.  1. Paula Miller was diagnosed with new onset type 1 diabetes mellitus, dehydration, and ketonuria on 06/09/2005. She was initially treated with a multiple daily injection regimen of Lantus and Novolog insulins.  We converted her to a Medtronic insulin pump in November of 2007.  Although the patient does have a palpable goiter, she has remained euthyroid. Her CMPs, lipid panels, and urinary microalbumin to creatinine ratios have always been normal.   2. Since last visit to [facility] on 03/2017, she has been well.  No ER visits or hospitalizations.   Paula Miller is doing much better recently. She has been trying harder with her diabetes to make sure she boluses and scans her Freestyle libre multiple times per day. She has noticed that her blood sugars have not been as high and she is feeling better. She has recently been having a lot of anxiety due to issues with her roommate and she will be taking the GRE soon. She refuses to go to counseling because she is going to get her major in counseling an feels like "their techniques would not work". Overall, she feels like she is improving.    Insulin regimen: Medtronic Insulin pump Basal Rates 12AM 0.750   4am  0.775   630am  1.45   10am  1.80   9pm  1.50    Insulin to Carbohydrate Ratio 12AM  10  6am  8  11am 10  5pm 10       Insulin Sensitivity Factor 12AM  70   6am 50  10pm 70         Target Blood Glucose 12AM  150  6am 110  11pm 150          Hypoglycemia: Able to feel low blood sugars.  No glucagon needed recently.  Blood glucose download: Checking bg 1.8 times per day. Avg Bg 367  - She is using Freestyle libre now more then testing.   - She is bolusing 2.1 times per day on average.    - She is using 74% basal.  Freestyle Libre CGM: Avg Bg 265. Scanning 3-6 times per day  - In range 21%, Above range 75%. Below range 4%.  Med-alert ID: Not currently wearing. Injection sites: bilateral hips  Annual labs due: 2019  Ophthalmology due: 2018 December.     3. ROS: Greater than 10 systems reviewed with pertinent positives listed in HPI, otherwise neg. Constitutional: Reports good energy and appetite.  Eyes: No changes in vision. Denies blurry vision. Wears glasses.  Ears/Nose/Mouth/Throat: No difficulty swallowing.   Cardiovascular: No palpitations denies chest pain  Respiratory: No increased work of breathing.  Gastrointestinal: No constipation or diarrhea.  Genitourinary: No nocturia, no polyuria Neurologic: Normal sensation, no tremor Endocrine: No polydipsia.  No hyperpigmentation Psychiatric: Normal affect.   Past Medical History:   Past Medical History:  Diagnosis Date  . Allergy    seasonal allergies  . Goiter   . Hypoglycemia associated with diabetes ([place])   . Physical growth delay   . Type 1 diabetes mellitus in patient age 67-19 years with HbA1C goal below 7.5 diagnosed age 50  . VSD (ventricular septal defect)     Medications:  Outpatient Encounter Prescriptions as of 05/12/2017  Medication Sig Note  . APIDRA 100 UNIT/ML  injection USE 300 UNITS IN INSULIN  PUMP EVERY 48 HOURS   . Calcium Carb-Cholecalciferol (CALCIUM-VITAMIN D) 500-200 MG-UNIT tablet Take 1 tablet by mouth daily. 04/28/2017: Unsure of dose. Target brand.  Takes one daily  . Continuous Blood Gluc Sensor (FREESTYLE LIBRE SENSOR SYSTEM) MISC 1 kit by Does not apply route as needed.   Marland Kitchen glucagon 1 MG injection Use for Severe Hypoglycemia . Inject 3m intramuscularly if unresponsive, unable to swallow, unconscious and/or has seizure   . glucose blood (BAYER CONTOUR NEXT TEST) test strip CHECK BLOOD SURGAR 10 TIMES DAILY   . loratadine (CLARITIN) 10 MG tablet Take 10 mg by mouth daily.   .  Multiple Vitamin (MULTIVITAMIN) tablet Take 1 tablet by mouth daily.     .Marland Kitchentopiramate (TOPAMAX) 25 MG tablet Take 4 tablets (100 mg total) by mouth at bedtime.   . Vitamin D, Ergocalciferol, (DRISDOL) 50000 units CAPS capsule Take 1 capsule (50,000 Units total) by mouth every 7 (seven) days.   . butalbital-acetaminophen-caffeine (FIORICET, ESGIC) 50-325-40 MG tablet Reported on 04/22/2016 04/28/2017: Uses prn migraines, last taken last week  . Insulin Glargine (LANTUS SOLOSTAR) 100 UNIT/ML Solostar Pen Use up to 50 units daily if pump fails. (Patient not taking: Reported on 04/28/2017) 04/28/2017: Uses only for pump failure  . insulin lispro (HUMALOG KWIKPEN) 100 UNIT/ML KiwkPen Up to 50 units/day if pump fails (Patient not taking: Reported on 04/28/2017)   . Insulin Pen Needle 31G X 5 MM MISC Check Blood glucose 10x day (Patient not taking: Reported on 05/12/2017)   . ondansetron (ZOFRAN-ODT) 4 MG disintegrating tablet Take 1 tablet (4 mg total) by mouth every 8 (eight) hours as needed for nausea or vomiting. (Patient not taking: Reported on 11/17/2016)    No facility-administered encounter medications on file as of 05/12/2017.     Allergies: No Known Allergies  Surgical History: Past Surgical History:  Procedure Laterality Date  . FRENULECTOMY, LINGUAL    . periodontal surgery      Family History:  Family History  Problem Relation Age of Onset  . Allergies Mother   . Thyroid disease Mother   . Thyroid disease Maternal Grandmother   . Arthritis Maternal Grandfather   . Arthritis Paternal Grandmother   . Heart attack Paternal Grandfather   . Cancer Neg Hx       Social History: [place]: room[place] [employer] [place]   Physical Exam:  Vitals:   05/12/17 0841  BP: 112/70  Pulse: (!) 112  Weight: 128 lb 6.4 oz (58.2 kg)   BP 112/70   Pulse (!) 112   Wt 128 lb 6.4 oz (58.2 kg)   LMP 04/12/2017 (Exact Date)   BMI 22.04 kg/m  Body  mass index: body mass index is 22.04 kg/m. Growth percentile SmartLinks can only be used for patients less than 288years old.  Ht Readings from Last 3 Encounters:  04/28/17 5' 4"  (1.626 m)  02/07/17 5' 3.98" (1.625 m)  12/15/16 5' 3.98" (1.625 m)   Wt Readings from Last 3 Encounters:  05/12/17 128 lb 6.4 oz (58.2 kg)  04/28/17 128 lb 9.6 oz (58.3 kg)  04/04/17 128 lb 6.4 oz (58.2 kg)    General: Well developed, well nourished female in no acute distress. Alert and interactive. She is more talkative today.   Head: Normocephalic, atraumatic.   Eyes:  Pupils equal and round. EOMI.   Sclera white.  No eye drainage.  Ears/Nose/Mouth/Throat: Nares patent, no nasal drainage.  Normal dentition, mucous membranes moist.  Oropharynx intact. Neck: supple, no cervical lymphadenopathy, no thyromegaly Cardiovascular: normal rate (91 on manual count), normal rhythm, normal S1/S2, no murmurs Respiratory: No increased work of breathing.  Lungs clear to auscultation bilaterally.  No wheezes. Abdomen: soft, nontender, nondistended. Normal bowel sounds.  No appreciable masses  Extremities: warm, well perfused, cap refill < 2 sec.   Musculoskeletal: Normal muscle mass.  Normal strength Skin: warm, dry.  No rash or lesions. Neurologic: alert and oriented, normal speech and gait. Normal sensation to bilateral feet.   Labs:  Results for orders placed or performed in visit on 05/12/17  POCT HgB A1C  Result Value Ref Range   Hemoglobin A1C 10.5   POCT Glucose (Device for Home Use)  Result Value Ref Range   Glucose Fasting, POC  70 - 99 mg/dL   POC Glucose 301 (A) 70 - 99 mg/dl    Assessment/Plan: Dashanti is a 21 y.o. female with type 1 diabetes in poor control. Shamia has made improvements since her last visit and appears motivated to continue the changes. She is using her CGM more consistently and bolusing more often. She still has significant periods of hyperglycemia between 10am-9pm when she  does not bolus while busy. Her hemoglobin A1c has improved from 12.5% to 10.5% 1. DM w/o complication type I, uncontrolled ([place]) - Need to BOLUS with all meals and to correct for high blood sugars.  - Check bg at least 4 times per day.  - Continue using Freestyle libre.  - POCT Glucose (CBG) - POCT HgB A1C - Reviewed blood sugar log with patient.  - Reviewed Freestyle libre report.   2. Insulin pump - Basal Rates 12AM 0.750   4am  0.775--> 0.75   630am  1.45--> 1.375   10am  1.80--> 1.85   9pm  1.50    Insulin to Carbohydrate Ratio--> NO CHANGE>  12AM  10  6am  8  11am 10  5pm 10       Insulin Sensitivity Factor 12AM  70   6am 50--> 42  10pm 70          3. Maladaptive health behaviors affecting medical condition/noncompliance  - Praise given for improvements she has made.  - Discussed bolusing with all meals and for high blood sugars.  - Discussed life/work/diabetes balance.    4. Inappropriate Sinus Tachycardia  - Normal heart rate today on manual count. Likely will improve or resolved with good diabetes control.    Follow-up:   2 months   Medical decision-making:  > 25 minutes spent, more than 50% of appointment was spent discussing diagnosis and management of symptoms  Hermenia Bers, FNP-C

## 2017-05-12 NOTE — Patient Instructions (Signed)
-   Opposite flexfix tape  - Griffgrips   - 12am: 0.750  - 4am: 0.775 - 630: 1.45--> 1.375 - 10am: 1.80--> 1.850  - 9pm: 1.50   Sensitivity changes  - 6am: 50--> 42  - Goal is to have 2-3 boluses per day  - Continue freestyle libre.

## 2017-05-17 ENCOUNTER — Encounter: Payer: Self-pay | Admitting: Neurology

## 2017-05-17 ENCOUNTER — Ambulatory Visit (INDEPENDENT_AMBULATORY_CARE_PROVIDER_SITE_OTHER): Payer: 59 | Admitting: Neurology

## 2017-05-17 VITALS — BP 106/72 | HR 94 | Ht 64.0 in | Wt 128.8 lb

## 2017-05-17 DIAGNOSIS — G43009 Migraine without aura, not intractable, without status migrainosus: Secondary | ICD-10-CM | POA: Diagnosis not present

## 2017-05-17 MED ORDER — TOPIRAMATE 100 MG PO TABS: 100.0000 mg | ORAL_TABLET | Freq: Every day | ORAL | 12 refills | Status: DC

## 2017-05-17 NOTE — Patient Instructions (Signed)
Remember to drink plenty of fluid, eat healthy meals and do not skip any meals. Try to eat protein with a every meal and eat a healthy snack such as fruit or nuts in between meals. Try to keep a regular sleep-wake schedule and try to exercise daily, particularly in the form of walking, 20-30 minutes a day, if you can.   As far as your medications are concerned, I would like to suggest: Topiramate 100mg  at bedtime  I would like to see you back in 1 year, sooner if we need to. Please call us with any interim questions, concerns, problems, updates or refill requests.   Our phone number is [phone]. We also have an after hours call service for urgent matters and there is a physician on-call for urgent questions. For any emergencies you know to call 911 or go to the nearest emergency room

## 2017-05-17 NOTE — Progress Notes (Signed)
[facility]    Provider:  Dr Jaynee Eagles Referring Provider: Rita Ohara, MD Primary Care Physician:  Rita Ohara, MD  CC: headaches  Interval history 05/17/2017:  She is feeling better. She has 2-3 headaches a month and they last up to one day but can be resolved quckly with the acute medication. No side effects to the Topiramate.   Interval history 11/09/2016: She is on topiramate 27m daily. The zofran works. She didn;t like the imitrex. She is still having headaches at least 6-10 significant headaches during the month. She is taking the topamax every night. No side effects. She is having less headaches and the severity is improved. The quality is the same.  She has musculoskeletal neck pain.   Meds; Topiramate, zofran, imitrex(did not like)  HOEV:[id number] a 21y.o.femalehere as a referral from Dr. KHayden Pedroheadaches. Mother here who provides much information as well. 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 Can start unilaterally or whole, sometimes dull often throbbing and pounding, nausea, light sensitive, sound sensitive. Chiropractor has helped a little bit. They can last a whole day. She has fioricet, this is the first thing she has tried. It works. She has headaches every day. She has had to leave work early and stay home from class. She has a headache every day, possibly 10 migrainous monthlywith 2 serious necessitating medication every month. Migraines started as a child, saw Dr. HGaynell Face Mother with migraines. FHx astrocytoma. Unknown triggers or food triggers.  Reviewed notes, labs and imaging from outside physicians, which showed:  Hemoglobin A1c 9.7 on May 2017. BUN 12 and creatinine 0.76 in February 2017. TSH 1.173 very 25th 2017.   Review of Systems: Patient complains of  symptoms per HPI as well as the following symptoms: Blurred vision, murmur, headache. Pertinent negatives per HPI. All others negative.  Social History   Social History  . Marital status: Single    Spouse name: N/A  . Number of children: 0  . Years of education: 12+   Occupational History  . Student-[employer]   Social History Main Topics  . Smoking status: Never Smoker  . Smokeless tobacco: Never Used  . Alcohol use No  . Drug use: No  . Sexual activity: Not Currently    Partners: Male    Birth control/ protection: Condom   Other Topics Concern  . 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   [place] in [place] younger brother.    (mother--[name] T[place]ather grandparents are the [place]   1 dog at home   Works in [employer] at [employer](summer and during school year)      Caffeine use: daily    Family History  Problem Relation Age of Onset  . Allergies Mother   . Thyroid disease Mother   . Thyroid disease Maternal Grandmother   . Arthritis Maternal Grandfather   . Arthritis Paternal Grandmother   . Heart attack Paternal Grandfather   . Cancer Neg Hx     Past Medical History:  Diagnosis Date  . Allergy    seasonal allergies  . Goiter   . Hypoglycemia associated with diabetes [place]   . Physical growth delay   . Type 1 diabetes mellitus in patient age 21-19years with HbA1C goal below 7.5 diagnosed age 21 . VSD (ventricular  septal defect)     Past Surgical History:  Procedure Laterality Date  . FRENULECTOMY, LINGUAL    . periodontal surgery      Current Outpatient Prescriptions  Medication Sig Dispense Refill  . APIDRA 100 UNIT/ML injection USE 300 UNITS IN INSULIN  PUMP EVERY 48 HOURS 140 mL 6  . butalbital-acetaminophen-caffeine (FIORICET, ESGIC) 50-325-40 MG tablet Reported on 04/22/2016  3  . Calcium Carb-Cholecalciferol (CALCIUM-VITAMIN D) 500-200 MG-UNIT tablet Take 1 tablet by mouth daily.    . Continuous  Blood Gluc Sensor (FREESTYLE LIBRE SENSOR SYSTEM) MISC 1 kit by Does not apply route as needed. 3 each 6  . glucagon 1 MG injection Use for Severe Hypoglycemia . Inject 20m intramuscularly if unresponsive, unable to swallow, unconscious and/or has seizure 2 kit 6  . glucose blood (BAYER CONTOUR NEXT TEST) test strip CHECK BLOOD SURGAR 10 TIMES DAILY 900 each 4  . Insulin Glargine (LANTUS SOLOSTAR) 100 UNIT/ML Solostar Pen Use up to 50 units daily if pump fails. 5 pen 6  . insulin lispro (HUMALOG KWIKPEN) 100 UNIT/ML KiwkPen Up to 50 units/day if pump fails 5 pen 6  . Insulin Pen Needle 31G X 5 MM MISC Check Blood glucose 10x day 900 each 3  . loratadine (CLARITIN) 10 MG tablet Take 10 mg by mouth daily.    . Multiple Vitamin (MULTIVITAMIN) tablet Take 1 tablet by mouth daily.      . ondansetron (ZOFRAN-ODT) 4 MG disintegrating tablet Take 1 tablet (4 mg total) by mouth every 8 (eight) hours as needed for nausea or vomiting. 30 tablet 12  . topiramate (TOPAMAX) 25 MG tablet Take 4 tablets (100 mg total) by mouth at bedtime. 120 tablet 12  . Vitamin D, Ergocalciferol, (DRISDOL) 50000 units CAPS capsule Take 1 capsule (50,000 Units total) by mouth every 7 (seven) days. 8 capsule 0   No current facility-administered medications for this visit.     Allergies as of 05/17/2017  . (No Known Allergies)    Vitals: BP 106/72   Pulse 94   Ht _0  (1.626 m)   Wt 128 lb 12.8 oz (58.4 kg)   BMI 22.11 kg/m  Last Weight:  Wt Readings from Last 1 Encounters:  05/17/17 128 lb 12.8 oz (58.4 kg)   Last Height:   Ht Readings from Last 1 Encounters:  05/17/17 _1  (1.626 m)   Physical exam: Exam: Gen: NAD, conversant, well nourised, well groomed                     CV: RRR, no MRG. No Carotid Bruits. No peripheral edema, warm, nontender Eyes: Conjunctivae clear without exudates or hemorrhage  Neuro: Detailed Neurologic Exam  Speech:    Speech is normal; fluent and spontaneous with normal  comprehension.  Cognition:    The patient is oriented to person, place, and time;     recent and remote memory intact;     language fluent;     normal attention, concentration,     fund of knowledge Cranial Nerves:    The pupils are equal, round, and reactive to light. The fundi are normal and spontaneous venous pulsations are present. Visual fields are full to finger confrontation. Extraocular movements are intact. Trigeminal sensation is intact and the muscles of mastication are normal. The face is symmetric. The palate elevates in the midline. Hearing intact. Voice is normal. Shoulder shrug is normal. The tongue has normal motion without fasciculations.   Coordination:    Normal  finger to nose and heel to shin. Normal rapid alternating movements.   Gait:    Heel-toe and tandem gait are normal.   Motor Observation:    No asymmetry, no atrophy, and no involuntary movements noted. Tone:    Normal muscle tone.    Posture:    Posture is normal. normal erect    Strength:    Strength is V/V in the upper and lower limbs.      Sensation: intact to LT     Reflex Exam:  DTR's:    Deep tendon reflexes in the upper and lower extremities are normal bilaterally.   Toes:    The toes are downgoing bilaterally.   Clonus:    Clonus is absent.   Assessment/Plan:21 year old with chronic migraines without aura. She has uncontrolled diabetes which can be contributing to her migraine frequency and severity, discussed with mother and daughter. Improved on Topamax, on 43m at night will increase.   As far as your medications are concerned, I would like to suggest: Continue Topiramate 1011mwill refill.   Migraine: Maxalt and zofran at onset of headache. Can repeat in 2 hours if needed.  Discussed tertogenicity, need for 2 forms of birth control, do not get pregnant, use birth control Maxalt at onset of migraine. May repeat in 2 hours once if needed.   Uncontrolled diabetes: I had a long  discussion about the neurologic sequelae of this condition including worsening headaches. Advised her to be compliant with medications and follow closely with physician. Improved diabetic control.  Nausea: Zofran prn. Improved.  Discussed side effects as per patient instructions and teratogenicity  Discussed: To prevent or relieve headaches, try the following: Cool Compress. Lie down and place a cool compress on your head.  Avoid headache triggers. If certain foods or odors seem to have triggered your migraines in the past, avoid them. A headache diary might help you identify triggers.  Include physical activity in your daily routine. Try a daily walk or other moderate aerobic exercise.  Manage stress. Find healthy ways to cope with the stressors, such as delegating tasks on your to-do list.  Practice relaxation techniques. Try deep breathing, yoga, massage and visualization.  Eat regularly. Eating regularly scheduled meals and maintaining a healthy diet might help prevent headaches. Also, drink plenty of fluids.  Follow a regular sleep schedule. Sleep deprivation might contribute to headaches Consider biofeedback. With this mind-body technique, you learn to control certain bodily functions - such as muscle tension, heart rate and blood pressure - to prevent headaches or reduce headache pain.    Proceed to emergency room if you experience new or worsening symptoms or symptoms do not resolve, if you have new neurologic symptoms or if headache is severe, or for any concerning symptom.   Provided education and documentation from American headache Society toolbox including articles on: chronic migraine medication overuse headache, chronic migraines, prevention of migraines, behavioral and other nonpharmacologic treatments for headache.  AnSarina IllMD   [facility] [address] [place]orNew [place] [address]Phone [phone] total of  15 minutes was spent face-to-face with this patient. Over half this time was spent on counseling patient on the migraine diagnosis and different diagnostic and therapeutic options available.

## 2017-05-19 DIAGNOSIS — M9901 Segmental and somatic dysfunction of cervical region: Secondary | ICD-10-CM | POA: Diagnosis not present

## 2017-05-19 DIAGNOSIS — G43109 Migraine with aura, not intractable, without status migrainosus: Secondary | ICD-10-CM | POA: Diagnosis not present

## 2017-05-19 DIAGNOSIS — M9902 Segmental and somatic dysfunction of thoracic region: Secondary | ICD-10-CM | POA: Diagnosis not present

## 2017-06-13 DIAGNOSIS — G43109 Migraine with aura, not intractable, without status migrainosus: Secondary | ICD-10-CM | POA: Diagnosis not present

## 2017-06-13 DIAGNOSIS — M9902 Segmental and somatic dysfunction of thoracic region: Secondary | ICD-10-CM | POA: Diagnosis not present

## 2017-06-13 DIAGNOSIS — M9901 Segmental and somatic dysfunction of cervical region: Secondary | ICD-10-CM | POA: Diagnosis not present

## 2017-07-12 DIAGNOSIS — M9902 Segmental and somatic dysfunction of thoracic region: Secondary | ICD-10-CM | POA: Diagnosis not present

## 2017-07-12 DIAGNOSIS — M9901 Segmental and somatic dysfunction of cervical region: Secondary | ICD-10-CM | POA: Diagnosis not present

## 2017-07-12 DIAGNOSIS — G43109 Migraine with aura, not intractable, without status migrainosus: Secondary | ICD-10-CM | POA: Diagnosis not present

## 2017-07-14 ENCOUNTER — Ambulatory Visit (INDEPENDENT_AMBULATORY_CARE_PROVIDER_SITE_OTHER): Payer: 59 | Admitting: Family

## 2017-07-14 ENCOUNTER — Encounter (INDEPENDENT_AMBULATORY_CARE_PROVIDER_SITE_OTHER): Payer: Self-pay | Admitting: Family

## 2017-07-14 VITALS — BP 112/60 | HR 112 | Wt 126.6 lb

## 2017-07-14 DIAGNOSIS — E1065 Type 1 diabetes mellitus with hyperglycemia: Secondary | ICD-10-CM

## 2017-07-14 DIAGNOSIS — R824 Acetonuria: Secondary | ICD-10-CM | POA: Diagnosis not present

## 2017-07-14 DIAGNOSIS — R739 Hyperglycemia, unspecified: Secondary | ICD-10-CM | POA: Diagnosis not present

## 2017-07-14 DIAGNOSIS — Z4681 Encounter for fitting and adjustment of insulin pump: Secondary | ICD-10-CM | POA: Diagnosis not present

## 2017-07-14 DIAGNOSIS — F54 Psychological and behavioral factors associated with disorders or diseases classified elsewhere: Secondary | ICD-10-CM

## 2017-07-14 DIAGNOSIS — IMO0001 Reserved for inherently not codable concepts without codable children: Secondary | ICD-10-CM

## 2017-07-14 DIAGNOSIS — Z9119 Patient's noncompliance with other medical treatment and regimen: Secondary | ICD-10-CM

## 2017-07-14 DIAGNOSIS — Z91199 Patient's noncompliance with other medical treatment and regimen due to unspecified reason: Secondary | ICD-10-CM

## 2017-07-14 LAB — POCT URINALYSIS DIPSTICK: GLUCOSE UA: 2000

## 2017-07-14 LAB — POCT GLUCOSE (DEVICE FOR HOME USE): POC GLUCOSE: 408 mg/dL — AB (ref 70–99)

## 2017-07-14 MED ORDER — GLUCAGON (RDNA) 1 MG IJ KIT: PACK | INTRAMUSCULAR | 6 refills | Status: DC

## 2017-07-14 MED ORDER — INSULIN GLULISINE 100 UNIT/ML IJ SOLN: INTRAMUSCULAR | 6 refills | Status: DC

## 2017-07-14 NOTE — Patient Instructions (Signed)
Bolus for your blood sugars and carbs  - Your average blood sugar is 325.  - Follow up in 2 months

## 2017-07-17 ENCOUNTER — Encounter (INDEPENDENT_AMBULATORY_CARE_PROVIDER_SITE_OTHER): Payer: Self-pay | Admitting: Family

## 2017-07-17 NOTE — Progress Notes (Signed)
Pediatric Endocrinology Diabetes Consultation Follow-up Visit  Paula Miller [birth year] [id number]  Chief Complaint: Follow-up type 1 diabetes   Rita Ohara, MD   HPI: Paula Miller  is a 21 y.o. female presenting for follow-up of type 1 diabetes.  1. Paula Miller was diagnosed with new onset type 1 diabetes mellitus, dehydration, and ketonuria on 06/09/2005. She was initially treated with a multiple daily injection regimen of Lantus and Novolog insulins.  We converted her to a Medtronic insulin pump in November of 2007.  Although the patient does have a palpable goiter, she has remained euthyroid. Her CMPs, lipid panels, and urinary microalbumin to creatinine ratios have always been normal.   2. Since last visit to [facility] on 04/2017, she has been well.  No ER visits or hospitalizations.   Paula Miller feels that she has done better keeping an eye on her blood sugars now that she is using the freestyle libre .She admits that she is still not bolusing nearly enough and frequently has hyperglycemia. Even when she knows her blood sugar is high, she does not enter it into the pump to give corrections. She estimates she boluses 2 times a day when she eats. She feels like her life is "so busy" that it is hard to take care of her diabetes.    Insulin regimen: Medtronic Insulin pump Basal Rates 12AM 0.750   4am  0.750   630am  1.35   10am  1.85   9pm  1.40    Insulin to Carbohydrate Ratio 12AM  10  6am  8  11am 10  5pm 10       Insulin Sensitivity Factor 12AM  70   6am 42  10pm 70         Target Blood Glucose 12AM  150  6am 110  11pm 150          Hypoglycemia: Able to feel low blood sugars.  No glucagon needed recently.  Blood glucose download: Checking bg 1.9 times per day. Avg Bg 373  - She is using 71% basal, and 29% bolus   - She is bolusing 1.8 times per day. Eating 59 grams of carbs.  Freestyle Libre CGM: Avg Bg 325. Scanning 2-5times per day  - In range 9%, Above range 91%.  Below range 0%.  Med-alert ID: Not currently wearing. Injection sites: bilateral hips  Annual labs due: 2019  Ophthalmology due: 2018 December.     3. ROS: Greater than 10 systems reviewed with pertinent positives listed in HPI, otherwise neg. Constitutional: She is tired from work but otherwise feels healthy. She has lost 2 pounds.  Eyes: No changes in vision. Denies blurry vision. Wears glasses.  Ears/Nose/Mouth/Throat: No difficulty swallowing. No throat pain   Cardiovascular: No palpitations. No tachycardia  Respiratory: No increased work of breathing.  Gastrointestinal: No constipation or diarrhea.  Genitourinary: No nocturia, no polyuria Neurologic: Normal sensation, no tremor Endocrine: No polydipsia.  No hyperpigmentation Psychiatric: Normal affect. Denies anxiety and depression. Refuses counseling.   Past Medical History:   Past Medical History:  Diagnosis Date  . Allergy    seasonal allergies  . Goiter   . Hypoglycemia associated with diabetes ([place])   . Physical growth delay   . Type 1 diabetes mellitus in patient age 64-19 years with HbA1C goal below 7.5 diagnosed age 64  . VSD (ventricular septal defect)     Medications:  Outpatient Encounter Prescriptions as of 07/14/2017  Medication Sig Note  . Calcium Carb-Cholecalciferol (CALCIUM-VITAMIN D) 500-200 MG-UNIT tablet  Take 1 tablet by mouth daily. 04/28/2017: Unsure of dose. [employer] brand.  Takes one daily  . Continuous Blood Gluc Sensor (FREESTYLE LIBRE SENSOR SYSTEM) MISC 1 kit by Does not apply route as needed.   Marland Kitchen glucagon 1 MG injection Use for Severe Hypoglycemia . Inject 33m intramuscularly if unresponsive, unable to swallow, unconscious and/or has seizure   . glucose blood (BAYER CONTOUR NEXT TEST) test strip CHECK BLOOD SURGAR 10 TIMES DAILY   . insulin glulisine (APIDRA) 100 UNIT/ML injection USE 300 UNITS IN INSULIN  PUMP EVERY 48 HOURS   . loratadine (CLARITIN) 10 MG tablet Take 10 mg by mouth daily.   .  Multiple Vitamin (MULTIVITAMIN) tablet Take 1 tablet by mouth daily.     .Marland Kitchentopiramate (TOPAMAX) 100 MG tablet Take 1 tablet (100 mg total) by mouth at bedtime.   . [DISCONTINUED] APIDRA 100 UNIT/ML injection USE 300 UNITS IN INSULIN  PUMP EVERY 48 HOURS   . [DISCONTINUED] glucagon 1 MG injection Use for Severe Hypoglycemia . Inject 16mintramuscularly if unresponsive, unable to swallow, unconscious and/or has seizure   . butalbital-acetaminophen-caffeine (FIORICET, ESGIC) 50-325-40 MG tablet Reported on 04/22/2016 04/28/2017: Uses prn migraines, last taken last week  . Insulin Glargine (LANTUS SOLOSTAR) 100 UNIT/ML Solostar Pen Use up to 50 units daily if pump fails. (Patient not taking: Reported on 07/14/2017) 04/28/2017: Uses only for pump failure  . insulin lispro (HUMALOG KWIKPEN) 100 UNIT/ML KiwkPen Up to 50 units/day if pump fails (Patient not taking: Reported on 07/14/2017)   . Insulin Pen Needle 31G X 5 MM MISC Check Blood glucose 10x day (Patient not taking: Reported on 07/14/2017)   . ondansetron (ZOFRAN-ODT) 4 MG disintegrating tablet Take 1 tablet (4 mg total) by mouth every 8 (eight) hours as needed for nausea or vomiting. (Patient not taking: Reported on 07/14/2017)   . Vitamin D, Ergocalciferol, (DRISDOL) 50000 units CAPS capsule Take 1 capsule (50,000 Units total) by mouth every 7 (seven) days. (Patient not taking: Reported on 07/14/2017)    No facility-administered encounter medications on file as of 07/14/2017.     Allergies: No Known Allergies  Surgical History: Past Surgical History:  Procedure Laterality Date  . FRENULECTOMY, LINGUAL    . periodontal surgery      Family History:  Family History  Problem Relation Age of Onset  . Allergies Mother   . Thyroid disease Mother   . Thyroid disease Maternal Grandmother   . Arthritis Maternal Grandfather   . Arthritis Paternal Grandmother   . Heart attack Paternal Grandfather   . Cancer Neg Hx       Social History: Lives with:  roommate in a house [place]   Physical Exam:  Vitals:   07/14/17 0844  BP: 112/60  Pulse: (!) 112  Weight: 126 lb 9.6 oz (57.4 kg)   BP 112/60   Pulse (!) 112   Wt 126 lb 9.6 oz (57.4 kg)   BMI 21.73 kg/m  Body mass index: body mass index is 21.73 kg/m. Growth percentile SmartLinks can only be used for patients less than 2023ears old.  Ht Readings from Last 3 Encounters:  05/17/17 5' 4"  (1.626 m)  04/28/17 5' 4"  (1.626 m)  02/07/17 5' 3.98" (1.625 m)   Wt Readings from Last 3 Encounters:  07/14/17 126 lb 9.6 oz (57.4 kg)  05/17/17 128 lb 12.8 oz (58.4 kg)  05/12/17 128 lb 6.4 oz (58.2 kg)    General:  Well developed, well nourished female in no acute distress. She is appropriate, alert and oriented.  Head: Normocephalic, atraumatic.   Eyes:  Pupils equal and round. EOMI.   Sclera white.  No eye drainage. Wearing glasses.   Ears/Nose/Mouth/Throat: Nares patent, no nasal drainage.  Normal dentition, mucous membranes moist.  Oropharynx intact. Neck: supple, no cervical lymphadenopathy, no thyromegaly Cardiovascular: Mildly tachycardic (reports nervous), normal rhythm, normal S1/S2, no murmurs Respiratory: No increased work of breathing.  Lungs clear to auscultation bilaterally.  No wheezes. Abdomen: soft, nontender, nondistended. Normal bowel sounds.  No appreciable masses  Extremities: warm, well perfused, cap refill < 2 sec.   Musculoskeletal: Normal muscle mass.  Normal strength Skin: warm, dry.  No rash or lesions. Neurologic: alert and oriented, normal speech and gait. Normal sensation to bilateral feet.   Labs:  Results for orders placed or performed in visit on 07/14/17  POCT Glucose (Device for Home Use)  Result Value Ref Range   Glucose Fasting, POC  70 - 99 mg/dL   POC Glucose 408 (A) 70 - 99 mg/dl  POCT urinalysis dipstick  Result Value Ref Range   Color, UA     Clarity, UA     Glucose, UA 2,000     Bilirubin, UA     Ketones, UA small    Spec Grav, UA  1.010 - 1.025   Blood, UA     pH, UA  5.0 - 8.0   Protein, UA     Urobilinogen, UA  0.2 or 1.0 E.U./dL   Nitrite, UA     Leukocytes, UA  Negative    Assessment/Plan: Ashana is a 21 y.o. female with type 1 diabetes in poor control. Bettey is monitoring her blood sugars more closely with [employer]. However, she is not correcting her high blood sugars by entering them into there pump and bolusing. She is hyperglycemic the majority of the day and putting herself at high risk of diabetes complications.   1. DM w/o complication type I, uncontrolled (HCC) - Check bg at least 4 times per day (scan [place])  - Please enter blood sugar into pump to give correction when you are hyperglycemic.  - Bolus for food before you eat.   - POCT Glucose (CBG) - Urine Ketones (small)  - Reviewed blood sugar log with patient.  - Reviewed Freestyle libre report.   2. Insulin pump - Basal Rates 12AM 0.750--> 0.775   4am   0.75--> 0.80   630am  1.425   10am  1.85--> 1.90   9pm  1.50    Insulin to Carbohydrate Ratio--> NO CHANGE>  12AM  10  6am  8  11am 10  5pm 10       Insulin Sensitivity Factor 12AM  70--> 60    6am 42--> 38  10pm 70--> 60          3. Maladaptive health behaviors affecting medical condition/noncompliance  - We discusse dangers of hyperglycemia and possible complications of uncontrolled T1DM.  - Continue to encourage Brunette to attend counseling or discuss her struggle with diabetes with her mom.  - Answered questions.    4/5. Hyperglycemia/Ketonuria  - Gave correction per pump in office.  - Discussed ketone protocol: water, give insulin corrections, check blood sugar every 2 hours and just ketones every 2 hours until clear.  - Advised of signs of DKA and to report to hospital immediately.    Follow-up:   2 months   Medical decision-making:  > 25  minutes spent, more than 50% of appointment was spent  discussing diagnosis and management of symptoms  Hermenia Bers, FNP-C

## 2017-07-18 ENCOUNTER — Other Ambulatory Visit (INDEPENDENT_AMBULATORY_CARE_PROVIDER_SITE_OTHER): Payer: Self-pay | Admitting: *Deleted

## 2017-07-18 DIAGNOSIS — E1065 Type 1 diabetes mellitus with hyperglycemia: Secondary | ICD-10-CM

## 2017-07-18 DIAGNOSIS — IMO0001 Reserved for inherently not codable concepts without codable children: Secondary | ICD-10-CM

## 2017-07-18 DIAGNOSIS — R739 Hyperglycemia, unspecified: Secondary | ICD-10-CM

## 2017-07-18 MED ORDER — INSULIN GLULISINE 100 UNIT/ML IJ SOLN: INTRAMUSCULAR | 3 refills | Status: DC

## 2017-07-18 MED ORDER — GLUCAGON (RDNA) 1 MG IJ KIT: PACK | INTRAMUSCULAR | 3 refills | Status: DC

## 2017-08-05 DIAGNOSIS — Z23 Encounter for immunization: Secondary | ICD-10-CM | POA: Diagnosis not present

## 2017-09-03 ENCOUNTER — Other Ambulatory Visit (INDEPENDENT_AMBULATORY_CARE_PROVIDER_SITE_OTHER): Payer: Self-pay | Admitting: Family

## 2017-09-12 ENCOUNTER — Telehealth (INDEPENDENT_AMBULATORY_CARE_PROVIDER_SITE_OTHER): Payer: Self-pay | Admitting: Family

## 2017-09-12 ENCOUNTER — Other Ambulatory Visit (INDEPENDENT_AMBULATORY_CARE_PROVIDER_SITE_OTHER): Payer: Self-pay | Admitting: *Deleted

## 2017-09-12 DIAGNOSIS — E1065 Type 1 diabetes mellitus with hyperglycemia: Principal | ICD-10-CM

## 2017-09-12 DIAGNOSIS — IMO0001 Reserved for inherently not codable concepts without codable children: Secondary | ICD-10-CM

## 2017-09-12 MED ORDER — FREESTYLE LIBRE SENSOR SYSTEM MISC: 3 refills | Status: DC

## 2017-09-12 NOTE — Telephone Encounter (Signed)
  Who's calling (name and relationship to patient) : Misty StanleyLisa, mother  Best contact number: [phone]  Provider they see: Dalbert GarnetBeasley  Reason for call: Mother called stating she needs a PA for the [employer]re for [employer].  Please call mother back at [phone].     PRESCRIPTION REFILL ONLY  Name of prescription:  Pharmacy:

## 2017-09-25 DIAGNOSIS — J0140 Acute pansinusitis, unspecified: Secondary | ICD-10-CM | POA: Diagnosis not present

## 2017-10-10 ENCOUNTER — Ambulatory Visit (INDEPENDENT_AMBULATORY_CARE_PROVIDER_SITE_OTHER): Payer: 59 | Admitting: Family

## 2017-10-10 ENCOUNTER — Encounter (INDEPENDENT_AMBULATORY_CARE_PROVIDER_SITE_OTHER): Payer: Self-pay | Admitting: Family

## 2017-10-10 VITALS — BP 112/60 | HR 90 | Wt 125.6 lb

## 2017-10-10 DIAGNOSIS — Z4681 Encounter for fitting and adjustment of insulin pump: Secondary | ICD-10-CM | POA: Diagnosis not present

## 2017-10-10 DIAGNOSIS — F54 Psychological and behavioral factors associated with disorders or diseases classified elsewhere: Secondary | ICD-10-CM | POA: Diagnosis not present

## 2017-10-10 DIAGNOSIS — R824 Acetonuria: Secondary | ICD-10-CM

## 2017-10-10 DIAGNOSIS — E1065 Type 1 diabetes mellitus with hyperglycemia: Secondary | ICD-10-CM | POA: Diagnosis not present

## 2017-10-10 DIAGNOSIS — R7309 Other abnormal glucose: Secondary | ICD-10-CM

## 2017-10-10 DIAGNOSIS — Z9119 Patient's noncompliance with other medical treatment and regimen: Secondary | ICD-10-CM

## 2017-10-10 DIAGNOSIS — R739 Hyperglycemia, unspecified: Secondary | ICD-10-CM | POA: Diagnosis not present

## 2017-10-10 DIAGNOSIS — Z91199 Patient's noncompliance with other medical treatment and regimen due to unspecified reason: Secondary | ICD-10-CM

## 2017-10-10 LAB — POCT URINALYSIS DIPSTICK: GLUCOSE UA: 2000

## 2017-10-10 LAB — POCT GLUCOSE (DEVICE FOR HOME USE): POC GLUCOSE: 444 mg/dL — AB (ref 70–99)

## 2017-10-10 LAB — POCT GLYCOSYLATED HEMOGLOBIN (HGB A1C): Hemoglobin A1C: 9.6

## 2017-10-10 NOTE — Patient Instructions (Signed)
Goal is to bolus at least 1 time per day  Make sure to scan you rlibre or check bg at least 3 x per day  Send mychart messages as needed Follow up in Jan.  A1c is 9.6%.

## 2017-10-10 NOTE — Progress Notes (Signed)
Pediatric Endocrinology Diabetes Consultation Follow-up Visit  Paula Miller 22-Feb-1996 [id number]  Chief Complaint: Follow-up type 1 diabetes   Paula Miller, Eve, MD   HPI: Paula Miller  is a 21 y.o. female presenting for follow-up of type 1 diabetes.  1. Paula Miller was diagnosed with new onset type 1 diabetes mellitus, dehydration, and ketonuria on 06/09/2005. She was initially treated with a multiple daily injection regimen of Lantus and Novolog insulins.  We converted her to a Medtronic insulin pump in November of 2007.  Although the patient does have a palpable goiter, she has remained euthyroid. Her CMPs, lipid panels, and urinary microalbumin to creatinine ratios have always been normal.   2. Since last visit to [facility] on 06/2017, she has been well.  No ER visits or hospitalizations.   Paula Miller is [facility] [employer]. She was recently sick with a sinus infection and was treated with antibiotics. She currently has a cough and congestion and is afraid she is going to get another sinus infection. She reports that her diabetes care has been better but still not very good. She does well when she wearing her [facility], she states she scans it at least 3 times per day. Paula Miller has not been wearing Josephine IgoLibre consistently because it irritates her skin. She admits that she does not bolus some days and the most she will bolus 2 times per day. She reports that she is visual learner and needs to set reminders for herself so that she will bolus.     Insulin regimen: Medtronic Insulin pump Basal Rates 12AM 0.825   4am  0.80   630am  1.45   10am  1.95   9pm  1.50    Insulin to Carbohydrate Ratio 12AM  10  6am  8  11am 10  5pm 10       Insulin Sensitivity Factor 12AM  55   6am 37  10pm 55         Target Blood Glucose 12AM  150  6am 110  11pm 150          Hypoglycemia: Able to feel low blood sugars.  No glucagon needed recently.  Insulin Pump download:  Checking bg 1.8 times per day. Avg Bg 304  - She is using 81% basal, and 19% bolus   - Entering 44.6 grams of carbs per day.  Freestyle Libre CGM: Avg Bg 279  - Target Range: In range 15%, above range 80% and below range 5% Med-alert ID: Not currently wearing. Injection sites: bilateral hips  Annual labs due: 2019  Ophthalmology due: 2018 December.     3. ROS: Greater than 10 systems reviewed with pertinent positives listed in HPI, otherwise neg. Constitutional: She feels good. She has good energy and appetite. Her weight is stable.  Eyes: No changes in vision. Denies blurry vision. Wears glasses.  Ears/Nose/Mouth/Throat: No difficulty swallowing. No throat pain . + nasal congestion.  Cardiovascular: No palpitations. No tachycardia. No chest pain Respiratory: No increased work of breathing. Acknowledges cough Gastrointestinal: No constipation or diarrhea.  Genitourinary: No nocturia, no polyuria Neurologic: Normal sensation, no tremor Endocrine: No polydipsia.  No hyperpigmentation Psychiatric: Normal affect. Denies anxiety and depression. Refuses counseling.   Past Medical History:   Past Medical History:  Diagnosis Date  . Allergy    seasonal allergies  . Goiter   . Hypoglycemia associated with diabetes (HCC)   . Physical growth delay   . Type 1 diabetes mellitus in patient age 21-19 years with HbA1C  goal below 7.5 diagnosed age 89  . VSD (ventricular septal defect)     Medications:  Outpatient Encounter Medications as of 10/10/2017  Medication Sig Note  . Calcium Carb-Cholecalciferol (CALCIUM-VITAMIN D) 500-200 MG-UNIT tablet Take 1 tablet by mouth daily. 04/28/2017: Unsure of dose. Target brand.  Takes one daily  . Continuous Blood Gluc Sensor (FREESTYLE LIBRE SENSOR SYSTEM) MISC Use a sensor every 10 days NDC [id number]   . glucagon 1 MG injection Use for Severe Hypoglycemia . Inject 1mg  intramuscularly   . glucose blood (BAYER CONTOUR NEXT TEST) test strip CHECK BLOOD  SURGAR 10 TIMES DAILY   . insulin glulisine (APIDRA) 100 UNIT/ML injection USE 300 UNITS IN INSULIN  PUMP EVERY 48 HOURS   . loratadine (CLARITIN) 10 MG tablet Take 10 mg by mouth daily.   . Multiple Vitamin (MULTIVITAMIN) tablet Take 1 tablet by mouth daily.     Marland Kitchen. topiramate (TOPAMAX) 100 MG tablet Take 1 tablet (100 mg total) by mouth at bedtime.   . butalbital-acetaminophen-caffeine (FIORICET, ESGIC) 50-325-40 MG tablet Reported on 04/22/2016 04/28/2017: Uses prn migraines, last taken last week  . Insulin Glargine (LANTUS SOLOSTAR) 100 UNIT/ML Solostar Pen Use up to 50 units daily if pump fails. (Patient not taking: Reported on 07/14/2017) 04/28/2017: Uses only for pump failure  . insulin lispro (HUMALOG KWIKPEN) 100 UNIT/ML KiwkPen Up to 50 units/day if pump fails (Patient not taking: Reported on 07/14/2017)   . Insulin Pen Needle 31G X 5 MM MISC Check Blood glucose 10x day (Patient not taking: Reported on 07/14/2017)   . ondansetron (ZOFRAN-ODT) 4 MG disintegrating tablet Take 1 tablet (4 mg total) by mouth every 8 (eight) hours as needed for nausea or vomiting. (Patient not taking: Reported on 07/14/2017)   . Vitamin D, Ergocalciferol, (DRISDOL) 50000 units CAPS capsule Take 1 capsule (50,000 Units total) by mouth every 7 (seven) days. (Patient not taking: Reported on 07/14/2017)    No facility-administered encounter medications on file as of 10/10/2017.     Allergies: No Known Allergies  Surgical History: Past Surgical History:  Procedure Laterality Date  . FRENULECTOMY, LINGUAL    . periodontal surgery      Family History:  Family History  Problem Relation Age of Onset  . Allergies Mother   . Thyroid disease Mother   . Thyroid disease Maternal Grandmother   . Arthritis Maternal Grandfather   . Arthritis Paternal Grandmother   . Heart attack Paternal Grandfather   . Cancer Neg Hx       Social History: [facility] [employer]  [place] [employer].   Physical Exam:  Vitals:   10/10/17 0843  BP: 112/60  Pulse: 90  Weight: 125 lb 9.6 oz (57 kg)   BP 112/60   Pulse 90   Wt 125 lb 9.6 oz (57 kg)   BMI 21.56 kg/m  Body mass index: body mass index is 21.56 kg/m. Growth percentile SmartLinks can only be used for patients less than 21 years old.  Ht Readings from Last 3 Encounters:  05/17/17 5\' 4"  (1.626 m)  04/28/17 5\' 4"  (1.626 m)  02/07/17 5' 3.98" (1.625 m)   Wt Readings from Last 3 Encounters:  10/10/17 125 lb 9.6 oz (57 kg)  07/14/17 126 lb 9.6 oz (57.4 kg)  05/17/17 128 lb 12.8 oz (58.4 kg)    General: Well developed, well nourished female in no acute distress. She appears stated age.  Head: Normocephalic, atraumatic.  Eyes:  Pupils equal and round. EOMI.   Sclera white.  No eye drainage. Wearing glasses.   Ears/Nose/Mouth/Throat: Nares congested, + nasal drainage.  Normal dentition, mucous membranes moist.  Oropharynx intact. Neck: supple, no cervical lymphadenopathy, no thyromegaly Cardiovascular: Mildly tachycardic (reports nervous), normal rhythm, normal S1/S2, no murmurs Respiratory: No increased work of breathing.  Lungs clear to auscultation bilaterally.  No wheezes. Abdomen: soft, nontender, nondistended. Normal bowel sounds.  No appreciable masses  Extremities: warm, well perfused, cap refill < 2 sec.   Musculoskeletal: Normal muscle mass.  Normal strength Skin: warm, dry.  No rash or lesions. Libre on left arm.  Neurologic: alert and oriented, normal speech and gait. Normal sensation to bilateral feet.   Labs:  Results for orders placed or performed in visit on 10/10/17  POCT Glucose (Device for Home Use)  Result Value Ref Range   Glucose Fasting, POC  70 - 99 mg/dL   POC Glucose 161 (A) 70 - 99 mg/dl  POCT HgB W9U  Result Value Ref Range   Hemoglobin A1C 9.6   POCT urinalysis dipstick  Result Value Ref Range   Color, UA     Clarity, UA     Glucose, UA 2,000    Bilirubin, UA      Ketones, UA small    Spec Grav, UA  1.010 - 1.025   Blood, UA     pH, UA  5.0 - 8.0   Protein, UA     Urobilinogen, UA  0.2 or 1.0 E.U./dL   Nitrite, UA     Leukocytes, UA  Negative    Assessment/Plan: Lavonda is a 21 y.o. female with type 1 diabetes in poor control on insulin pump therapy. Kendrea is not bolusing to correct elevated blood sugars and frequently does not cover her carbs. She has 10 days in the past month with 0 boluses. She monitors her blood sugar well when using Libre but is not consisting when she has to do fingerstick checks. Her A1c is 9.6% today which is above the ADA goal of <7% for adults. She has hyperglycemia and ketonuria in office.  1. DM w/o complication type I, uncontrolled (HCC)/Hyperglycemia/ Elevated A1c.  - Encouraged to wear Freestyle libre.   - Use skin barrier wipes  - She must scan blood sugar at least 4 x per day - Encouraged to rotate pump sites.  - Discussed importance of accurate carb counting.  - POCT glucose as above.  - POCT hemoglobin A1c  - Urine ketones.    2. Insulin pump  - I spent extensive time reviewing pump settings, meter download, CGM download and carb intake to make changes to insulin pump settings.  Insulin Sensitivity Factor 12AM  55--> 50    6am 37--> 40  10pm 55--> 50            3. Maladaptive health behaviors affecting medical condition/noncompliance  - Advised that she must bolus for all carbs and to correct elevated blood sugars.  - Suggested that she put sticky notes in car and lunch box to remind herself.  - Must check blood sugar before driving. Reviewed DMV criteria of A1c <10%.  - Discussed possible complications from uncontrolled T1DM.    4/5. Hyperglycemia/Ketonuria  - correction dose given in office.  - Drink 1 bottle of water per hour  - Check ketones every 2 hours and blood sugar until ketones are clear.  - Give correction insulin as needed - If you develop nausea, vomiting, change in  LOC or  breathing--> ER immediately.    Follow-up:   2 months   I have spent >40 minutes with >50% of time in counseling, education and instruction. When a patient is on insulin, intensive monitoring of blood glucose levels is necessary to avoid hyperglycemia and hypoglycemia. Severe hyperglycemia/hypoglycemia can lead to hospital admissions and be life threatening.    Gretchen Short, FNP-C

## 2017-11-10 DIAGNOSIS — E109 Type 1 diabetes mellitus without complications: Secondary | ICD-10-CM | POA: Diagnosis not present

## 2017-11-10 DIAGNOSIS — H5213 Myopia, bilateral: Secondary | ICD-10-CM | POA: Diagnosis not present

## 2017-11-23 ENCOUNTER — Encounter (INDEPENDENT_AMBULATORY_CARE_PROVIDER_SITE_OTHER): Payer: Self-pay | Admitting: *Deleted

## 2017-12-19 ENCOUNTER — Ambulatory Visit (INDEPENDENT_AMBULATORY_CARE_PROVIDER_SITE_OTHER): Payer: 59 | Admitting: Family

## 2017-12-19 ENCOUNTER — Encounter (INDEPENDENT_AMBULATORY_CARE_PROVIDER_SITE_OTHER): Payer: Self-pay | Admitting: Family

## 2017-12-19 VITALS — BP 106/66 | HR 78 | Ht 64.17 in | Wt 116.4 lb

## 2017-12-19 DIAGNOSIS — L2489 Irritant contact dermatitis due to other agents: Secondary | ICD-10-CM | POA: Diagnosis not present

## 2017-12-19 DIAGNOSIS — R739 Hyperglycemia, unspecified: Secondary | ICD-10-CM | POA: Diagnosis not present

## 2017-12-19 DIAGNOSIS — Z9641 Presence of insulin pump (external) (internal): Secondary | ICD-10-CM

## 2017-12-19 DIAGNOSIS — Z4681 Encounter for fitting and adjustment of insulin pump: Secondary | ICD-10-CM | POA: Diagnosis not present

## 2017-12-19 DIAGNOSIS — F54 Psychological and behavioral factors associated with disorders or diseases classified elsewhere: Secondary | ICD-10-CM | POA: Diagnosis not present

## 2017-12-19 DIAGNOSIS — E1065 Type 1 diabetes mellitus with hyperglycemia: Secondary | ICD-10-CM

## 2017-12-19 DIAGNOSIS — IMO0001 Reserved for inherently not codable concepts without codable children: Secondary | ICD-10-CM

## 2017-12-19 LAB — POCT GLUCOSE (DEVICE FOR HOME USE): POC GLUCOSE: 246 mg/dL — AB (ref 70–99)

## 2017-12-19 NOTE — Progress Notes (Signed)
Pediatric Endocrinology Diabetes Consultation Follow-up Visit  Paula Miller 1996-10-31 [id number]  Chief Complaint: Follow-up type 1 diabetes   Paula Arrow, MD   HPI: Paula Miller  is a 22 y.o. female presenting for follow-up of type 1 diabetes.  1. Paula Miller was diagnosed with new onset type 1 diabetes mellitus, dehydration, and ketonuria on 06/09/2005. She was initially treated with a multiple daily injection regimen of Lantus and Novolog insulins.  We converted her to a Medtronic insulin pump in November of 2007.  Although the patient does have a palpable goiter, she has remained euthyroid. Her CMPs, lipid panels, and urinary microalbumin to creatinine ratios have always been normal.   2. Since last visit to [facility] on 09/2017, she has been well.  No ER visits or hospitalizations.   Paula Miller is accompanied by her mother today, she reports that she is not thrilled about this. Her mother is here because their insurance has changed and she needs to clarify. They will no longer be able to use Apidra and will need to switch to Humalog vials at the next refill. They were also denied coverage for [facility]. Mom would like to appeal this because Paula Miller.   She feels like she is making some improvement since last visit. She is trying to bolus more and has noticed that her blood sugars are better then they were before. She admits that some days she will completely forget to bolus and most days she only boluses 1-2 times. She often forgets or just becomes to distracted with work/school. She is testing blood sugar more often now that she has the [employer]. She recently starting getting a rash to her arm from the [place] and would like advice how to stop the rash.     Insulin regimen: Medtronic Insulin pump Basal Rates 12AM 0.825   4am  0.80   630am  1.45   10am  1.95   9pm  1.50    Insulin to Carbohydrate Ratio 12AM  10  6am  8  11am 10  5pm 10        Insulin Sensitivity Factor 12AM  55   6am 37  10pm 55         Target Blood Glucose 12AM  150  6am 110  11pm 150          Hypoglycemia: Able to feel low blood sugars.  No glucagon needed recently.  Insulin Pump download: Avg Bg 275. Checking 2.5 times per day  - Using 43 units per day. 83% basal and 17% bolus   - entering 43 grams of carbs per day.  Freestyle Libre CGM: Avg Bg 293.   - Target Range: in range 23%, above range 75% and below range 2%   - Hyperglycemia from 12pm-2am.  Med-alert ID: Not currently wearing. Injection sites: bilateral hips  Annual labs due: 2019 (done 10/2017 per patient) Ophthalmology due: 2018 December.     3. ROS: Greater than 10 systems reviewed with pertinent positives listed in HPI, otherwise neg. Constitutional: She has good energy and appetite. She has lost 9 pounds.   Eyes: No changes in vision. Denies blurry vision. Wears glasses.  Ears/Nose/Mouth/Throat: No difficulty swallowing. No throat pain  Cardiovascular: No palpitations. No tachycardia. No chest pain Respiratory: No increased work of breathing.  Gastrointestinal: No constipation or diarrhea.  Genitourinary: No nocturia, no polyuria Neurologic: Normal sensation, no tremor Endocrine: No polydipsia.  No hyperpigmentation Psychiatric: Normal affect. Denies anxiety and depression.  Past Medical History:   Past Medical History:  Diagnosis Date  . Allergy    seasonal allergies  . Goiter   . Hypoglycemia associated with diabetes (HCC)   . Physical growth delay   . Type 1 diabetes mellitus in patient age 23-19 years with HbA1C goal below 7.5 diagnosed age 88  . VSD (ventricular septal defect)     Medications:  Outpatient Encounter Medications as of 12/19/2017  Medication Sig Note  . butalbital-acetaminophen-caffeine (FIORICET, ESGIC) 50-325-40 MG tablet Reported on 04/22/2016 04/28/2017: Uses prn migraines, last taken last week  . Calcium Carb-Cholecalciferol (CALCIUM-VITAMIN  D) 500-200 MG-UNIT tablet Take 1 tablet by mouth daily. 04/28/2017: Unsure of dose. Target brand.  Takes one daily  . Continuous Blood Gluc Sensor (FREESTYLE LIBRE SENSOR SYSTEM) MISC Use a sensor every 10 days NDC [phone]   . glucagon 1 MG injection Use for Severe Hypoglycemia . Inject 1mg  intramuscularly   . glucose blood (BAYER CONTOUR NEXT TEST) test strip CHECK BLOOD SURGAR 10 TIMES DAILY   . Insulin Glargine (LANTUS SOLOSTAR) 100 UNIT/ML Solostar Pen Use up to 50 units daily if pump fails. (Patient not taking: Reported on 07/14/2017) 04/28/2017: Uses only for pump failure  . insulin glulisine (APIDRA) 100 UNIT/ML injection USE 300 UNITS IN INSULIN  PUMP EVERY 48 HOURS   . insulin lispro (HUMALOG KWIKPEN) 100 UNIT/ML KiwkPen Up to 50 units/day if pump fails (Patient not taking: Reported on 07/14/2017)   . Insulin Pen Needle 31G X 5 MM MISC Check Blood glucose 10x day (Patient not taking: Reported on 07/14/2017)   . loratadine (CLARITIN) 10 MG tablet Take 10 mg by mouth daily.   . Multiple Vitamin (MULTIVITAMIN) tablet Take 1 tablet by mouth daily.     . ondansetron (ZOFRAN-ODT) 4 MG disintegrating tablet Take 1 tablet (4 mg total) by mouth every 8 (eight) hours as needed for nausea or vomiting. (Patient not taking: Reported on 07/14/2017)   . topiramate (TOPAMAX) 100 MG tablet Take 1 tablet (100 mg total) by mouth at bedtime.   . Vitamin D, Ergocalciferol, (DRISDOL) 50000 units CAPS capsule Take 1 capsule (50,000 Units total) by mouth every 7 (seven) days. (Patient not taking: Reported on 07/14/2017)    No facility-administered encounter medications on file as of 12/19/2017.     Allergies: No Known Allergies  Surgical History: Past Surgical History:  Procedure Laterality Date  . FRENULECTOMY, LINGUAL    . periodontal surgery      Family History:  Family History  Problem Relation Age of Onset  . Allergies Mother   . Thyroid disease Mother   . Thyroid disease Maternal Grandmother   .  Arthritis Maternal Grandfather   . Arthritis Paternal Grandmother   . Heart attack Paternal Grandfather   . Cancer Neg Hx       Social History: Lives with: roommate in a [place] at [employer]  Currently in Senior year in college.   Physical Exam:  Vitals:   12/19/17 0908  BP: 106/66  Pulse: 78  Weight: 116 lb 6.4 oz (52.8 kg)  Height: 5' 4.17" (1.63 m)   BP 106/66 (BP Location: Right Arm, Patient Position: Sitting, Cuff Size: Large)   Pulse 78   Ht 5' 4.17" (1.63 m)   Wt 116 lb 6.4 oz (52.8 kg)   LMP 11/28/2017 (Exact Date)   BMI 19.87 kg/m  Body mass index: body mass index is 19.87 kg/m. Growth percentile SmartLinks can only be used for patients less than 46 years old.  Ht Readings from Last 3 Encounters:  12/19/17 5' 4.17" (1.63 m)  05/17/17 5\' 4"  (1.626 m)  04/28/17 5\' 4"  (1.626 m)   Wt Readings from Last 3 Encounters:  12/19/17 116 lb 6.4 oz (52.8 kg)  10/10/17 125 lb 9.6 oz (57 kg)  07/14/17 126 lb 9.6 oz (57.4 kg)    General: Well developed, well nourished female in no acute distress. She appears stated age.  Head: Normocephalic, atraumatic.   Eyes:  Pupils equal and round. EOMI.   Sclera white.  No eye drainage. Wearing glasses.   Ears/Nose/Mouth/Throat: Nares congested, + nasal drainage.  Normal dentition, mucous membranes moist.  Oropharynx intact. Neck: supple, no cervical lymphadenopathy, no thyromegaly Cardiovascular: Mildly tachycardic (reports nervous), normal rhythm, normal S1/S2, no murmurs Respiratory: No increased work of breathing.  Lungs clear to auscultation bilaterally.  No wheezes. Abdomen: soft, nontender, nondistended. Normal bowel sounds.  No appreciable masses  Extremities: warm, well perfused, cap refill < 2 sec.   Musculoskeletal: Normal muscle mass.  Normal strength Skin: warm, dry.  No rash or lesions. Libre on left arm.  Neurologic: alert and oriented, normal speech and gait. Normal sensation to bilateral feet.    Labs:  Results for orders placed or performed in visit on 12/19/17  POCT Glucose (Device for Home Use)  Result Value Ref Range   Glucose Fasting, POC  70 - 99 mg/dL   POC Glucose 119246 (A) 70 - 99 mg/dl    Assessment/Plan: Paula Miller is a 22 y.o. female with type 1 diabetes in poor control on insulin pump therapy. She is checking her blood sugar more frequently but continues to struggle with bolusing. She has 3 days in the past 10 with 0 boluses for the day. She is depending on her basal insulin to keep her blood sugars in target. Freestyle Josephine Igolibre has been helpful with increasing her compliance with blood sugar testing. She is hyperglycemic in clinic today.   1. DM w/o complication type I, uncontrolled (HCC)/Hyperglycemia/ Elevated A1c.  - Medtronic 530g insulin pump - rotate pump sites.  - check bg or scan libre at least 4 x per day  - bolus before eating.  - reviewed carb counting  - POCT glucose as above.   2. Insulin pump  - I spent extensive time reviewing pump settings, meter download, CGM download and carb intake to make changes to insulin pump settings.   Target Blood Glucose 12AM  150  6am 110  11pm 150--> 140           3. Maladaptive health behaviors affecting medical condition - Discussed barriers to care - Encouraged to use break times as a time to check blood sugar and give bolus  - She set goal for 0 days without a bolus at next visit.    4. Contact dermatitis  - Advised to try using Flonase on skin prior to putting on CGM.  - Can also put barrier tape on skin prior to placing LIbre.    Follow-up:   2 months    I have spent >40 minutes with >50% of time in counseling, education and instruction. When a patient is on insulin, intensive monitoring of blood glucose levels is necessary to avoid hyperglycemia and hypoglycemia. Severe hyperglycemia/hypoglycemia can lead to hospital admissions and be life threatening.     Gretchen ShortSpenser Donique Hammonds, FNP-C

## 2017-12-19 NOTE — Patient Instructions (Signed)
-   No changes  - Bolus when you eat!  - Check bg at least 4 x per day  - Follow up in 2 months.   - Use Flonase before putting libre on  - If that does not work, put tagaderm directly on skin then [place]-on-the-[place]elibre on top.   - Follow up in 2 months.

## 2017-12-20 ENCOUNTER — Telehealth (INDEPENDENT_AMBULATORY_CARE_PROVIDER_SITE_OTHER): Payer: Self-pay | Admitting: *Deleted

## 2017-12-20 NOTE — Telephone Encounter (Signed)
Spoke to mother, advised that due to not being in control and not checking glucose, insurance has refused to cover the Webblibre. They are going to see how much a decom is and how much the Josephine Igolibre is out of pocket.

## 2018-01-12 DIAGNOSIS — J0140 Acute pansinusitis, unspecified: Secondary | ICD-10-CM | POA: Diagnosis not present

## 2018-02-23 DIAGNOSIS — N926 Irregular menstruation, unspecified: Secondary | ICD-10-CM | POA: Diagnosis not present

## 2018-02-27 ENCOUNTER — Ambulatory Visit (INDEPENDENT_AMBULATORY_CARE_PROVIDER_SITE_OTHER): Payer: 59 | Admitting: Family

## 2018-02-27 ENCOUNTER — Encounter (INDEPENDENT_AMBULATORY_CARE_PROVIDER_SITE_OTHER): Payer: Self-pay | Admitting: Family

## 2018-02-27 VITALS — BP 100/60 | HR 90 | Ht 64.06 in | Wt 115.4 lb

## 2018-02-27 DIAGNOSIS — R739 Hyperglycemia, unspecified: Secondary | ICD-10-CM | POA: Diagnosis not present

## 2018-02-27 DIAGNOSIS — R7309 Other abnormal glucose: Secondary | ICD-10-CM

## 2018-02-27 DIAGNOSIS — E1065 Type 1 diabetes mellitus with hyperglycemia: Secondary | ICD-10-CM

## 2018-02-27 DIAGNOSIS — Z9641 Presence of insulin pump (external) (internal): Secondary | ICD-10-CM | POA: Diagnosis not present

## 2018-02-27 DIAGNOSIS — E10649 Type 1 diabetes mellitus with hypoglycemia without coma: Secondary | ICD-10-CM

## 2018-02-27 DIAGNOSIS — F54 Psychological and behavioral factors associated with disorders or diseases classified elsewhere: Secondary | ICD-10-CM | POA: Diagnosis not present

## 2018-02-27 DIAGNOSIS — IMO0001 Reserved for inherently not codable concepts without codable children: Secondary | ICD-10-CM

## 2018-02-27 LAB — POCT GLYCOSYLATED HEMOGLOBIN (HGB A1C): HEMOGLOBIN A1C: 10

## 2018-02-27 LAB — POCT GLUCOSE (DEVICE FOR HOME USE): POC Glucose: 228 mg/dL — AB (ref 70–99)

## 2018-02-27 NOTE — Patient Instructions (Signed)
-   BOlus before you eat  - Rotate pump site every 3 days  - Try new areas  - Check bg at least 4 x per day   - Always before driving   - [id number] is 09%10%  - Follow up in June

## 2018-02-27 NOTE — Progress Notes (Signed)
Pediatric Endocrinology Diabetes Consultation Follow-up Visit  Young Brim 08-26-1996 [id number]  Chief Complaint: Follow-up type 1 diabetes   [name], MD   HPI: Lorelle  is a 22 y.o. female presenting for follow-up of type 1 diabetes.  1. Audreena was diagnosed with new onset type 1 diabetes mellitus, dehydration, and ketonuria on 06/09/2005. She was initially treated with a multiple daily injection regimen of Lantus and Novolog insulins.  We converted her to a Medtronic insulin pump in November of 2007.  Although the patient does have a palpable goiter, she has remained euthyroid. Her CMPs, lipid panels, and urinary microalbumin to creatinine ratios have always been normal.   2. Since last visit to [facility] on 11/2017, she has been well.  No ER visits or hospitalizations.   Quida has been busy working and attending school. 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 She reports that she is checking her blood sugar more lately. She is not using the freestyle libre currently. She is not bolusing very often because she gets distracted. Oluwadara states that she tells herself that she will "do it in a minute" and then gets busy and forgets to bolus. She is rotating her pump site every 4 days and mainly uses her hips. She acknowledges that she needs to be consistent with bolusing in order to take better care of her diabetes.   Insulin regimen: Medtronic Insulin pump Basal Rates 12AM 0.825   4am  0.80   630am  1.45   10am  1.95   9pm  1.50    Insulin to Carbohydrate Ratio 12AM  10  6am  8  11am 10  5pm 10       Insulin Sensitivity Factor 12AM  55   6am 37  10pm 55         Target Blood Glucose 12AM  150  6am 110  11pm 150          Hypoglycemia: Able to feel low blood sugars.  No glucagon needed recently.  Insulin Pump download:   - Avg Bg 293. Checking 2.1 times per day on average.   - Target Range:  Hyperglycemic 72%, In target 21% and hypoglycemic 7%.   - Entering 41 grams of carbs per day. Bolusing 1 x per day on average.   - In the past month she has 13 days with 0 boluses  Freestyle Libre CGM: Not wearing.  Med-alert ID: Not currently wearing. Injection sites: bilateral hips  Annual labs due: 2019 (done 10/2017 per patient) Ophthalmology due: 2018 December. Discussed importance of dilated eye exam.     3. ROS: Greater than 10 systems reviewed with pertinent positives listed in HPI, otherwise neg. Constitutional: She reports good energy and appetite.  Eyes: No changes in vision. Denies blurry vision. Wears glasses.  Ears/Nose/Mouth/Throat: No difficulty swallowing. No throat pain  Cardiovascular: No palpitations. No tachycardia. No chest pain Respiratory: No increased work of breathing.  Gastrointestinal: No constipation or diarrhea.  Genitourinary: No nocturia, no polyuria Neurologic: Normal sensation, no tremor Endocrine: No polydipsia.  No hyperpigmentation Psychiatric: Normal affect. Denies anxiety and depression.   Past Medical History:   Past Medical History:  Diagnosis Date  . Allergy    seasonal allergies  . Goiter   . Hypoglycemia associated with diabetes (HCC)   . Physical growth delay   . Type 1 diabetes mellitus in patient age 24-19 years with HbA1C goal below 7.5 diagnosed age 69  . VSD (  ventricular septal defect)     Medications:  Outpatient Encounter Medications as of 02/27/2018  Medication Sig Note  . Calcium Carb-Cholecalciferol (CALCIUM-VITAMIN D) 500-200 MG-UNIT tablet Take 1 tablet by mouth daily. 04/28/2017: Unsure of dose. Target brand.  Takes one daily  . glucagon 1 MG injection Use for Severe Hypoglycemia . Inject 1mg  intramuscularly   . glucose blood (BAYER CONTOUR NEXT TEST) test strip CHECK BLOOD SURGAR 10 TIMES DAILY   . insulin glulisine (APIDRA) 100 UNIT/ML injection USE 300 UNITS IN INSULIN  PUMP EVERY 48 HOURS   . loratadine (CLARITIN) 10 MG  tablet Take 10 mg by mouth daily.   . Multiple Vitamin (MULTIVITAMIN) tablet Take 1 tablet by mouth daily.     Marland Kitchen topiramate (TOPAMAX) 100 MG tablet Take 1 tablet (100 mg total) by mouth at bedtime.   . butalbital-acetaminophen-caffeine (FIORICET, ESGIC) 50-325-40 MG tablet Reported on 04/22/2016 04/28/2017: Uses prn migraines, last taken last week  . Continuous Blood Gluc Sensor (FREESTYLE LIBRE SENSOR SYSTEM) MISC Use a sensor every 10 days NDC [phone] (Patient not taking: Reported on 02/27/2018)   . Insulin Glargine (LANTUS SOLOSTAR) 100 UNIT/ML Solostar Pen Use up to 50 units daily if pump fails. (Patient not taking: Reported on 07/14/2017) 04/28/2017: Uses only for pump failure  . insulin lispro (HUMALOG KWIKPEN) 100 UNIT/ML KiwkPen Up to 50 units/day if pump fails (Patient not taking: Reported on 07/14/2017)   . Insulin Pen Needle 31G X 5 MM MISC Check Blood glucose 10x day (Patient not taking: Reported on 07/14/2017)   . ondansetron (ZOFRAN-ODT) 4 MG disintegrating tablet Take 1 tablet (4 mg total) by mouth every 8 (eight) hours as needed for nausea or vomiting. (Patient not taking: Reported on 07/14/2017)   . Vitamin D, Ergocalciferol, (DRISDOL) 50000 units CAPS capsule Take 1 capsule (50,000 Units total) by mouth every 7 (seven) days. (Patient not taking: Reported on 07/14/2017)    No facility-administered encounter medications on file as of 02/27/2018.     Allergies: No Known Allergies  Surgical History: Past Surgical History:  Procedure Laterality Date  . FRENULECTOMY, LINGUAL    . periodontal surgery      Family History:  Family History  Problem Relation Age of Onset  . Allergies Mother   . Thyroid disease Mother   . Thyroid disease Maternal Grandmother   . Arthritis Maternal Grandfather   . Arthritis Paternal Grandmother   . Heart attack Paternal Grandfather   . Cancer Neg Hx       Social History: Lives with: roommate in a house at [place]  Currently in Senior year  in college. Will be attending [employer] next year to get Masters degree.   Physical Exam:  Vitals:   02/27/18 0914  BP: 100/60  Pulse: 90  Weight: 115 lb 6.4 oz (52.3 kg)  Height: 5' 4.06" (1.627 m)   BP 100/60   Pulse 90   Ht 5' 4.06" (1.627 m)   Wt 115 lb 6.4 oz (52.3 kg)   BMI 19.77 kg/m  Body mass index: body mass index is 19.77 kg/m. Growth percentile SmartLinks can only be used for patients less than 76 years old.  Ht Readings from Last 3 Encounters:  02/27/18 5' 4.06" (1.627 m)  12/19/17 5' 4.17" (1.63 m)  05/17/17 5\' 4"  (1.626 m)   Wt Readings from Last 3 Encounters:  02/27/18 115 lb 6.4 oz (52.3 kg)  12/19/17 116 lb 6.4 oz (52.8 kg)  10/10/17 125 lb 9.6 oz (57 kg)  Physical Exam   General: Well developed, well nourished female in no acute distress. She is alert and oriented.  Head: Normocephalic, atraumatic.   Eyes:  Pupils equal and round. EOMI.   Sclera white.  No eye drainage.  Wearing glasses.  Ears/Nose/Mouth/Throat: Nares patent, no nasal drainage.  Normal dentition, mucous membranes moist.  Oropharynx intact. Neck: supple, no cervical lymphadenopathy, no thyromegaly Cardiovascular: regular rate, normal S1/S2, no murmurs Respiratory: No increased work of breathing.  Lungs clear to auscultation bilaterally.  No wheezes. Abdomen: soft, nontender, nondistended. Normal bowel sounds.  No appreciable masses  Extremities: warm, well perfused, cap refill < 2 sec.   Musculoskeletal: Normal muscle mass.  Normal strength Skin: warm, dry.  No rash or lesions. Neurologic: alert and oriented, normal speech   Labs:  Results for orders placed or performed in visit on 02/27/18  POCT Glucose (Device for Home Use)  Result Value Ref Range   Glucose Fasting, POC  70 - 99 mg/dL   POC Glucose 086228 (A) 70 - 99 mg/dl  POCT HgB [id number]  Result Value Ref Range   Hemoglobin A1C 10.0     Assessment/Plan: [name] is a 22 y.o. female with type 1 diabetes in poor and  worsening control on insulin pump therapy. [name] has done better checking her blood sugar, although she is no longer using CGM therapy. She is having more hyperglycemia due to frequently omitting boluses. Her hemoglobin A1c has increased to 10% which is great then the ADA goal of <7.0% for adults. She has severe hyperglycemia at times due to frequently skipping bolus doses. She is hyperglycemic today in clinic.   1-4. DM w/o complication type I, uncontrolled (HCC)/Hyperglycemia/ Elevated A1c/hypoglycemia.  - Medtronic 530g insulin pump - Advised to bolus BEFORE eating - Reviewed carb counting.  - Check bg at least 4 x per day day or wear CGM.  - Rotate insulin pump site to prevent lipohypertrophy.  - Advised she needs to have dilated eye exam.  - POCT glucose  - POCT hemoglobin A1c.   5. Insulin pump in place   - I spent extensive time reviewing pump settings, meter download, CGM download and carb intake to make changes to insulin pump settings.  - No changes at this time. She needs to bolus before insulin dose can be properly adjusted.    6. Maladaptive health behaviors affecting medical condition - Discussed importance of good diabetes control to prevent complications related to T1DM.  - Advised that she give insulin before eating to help prevent distractions.  - Discussed transition to adult care.     Follow-up:  2 months    When a patient is on insulin, intensive monitoring of blood glucose levels is necessary to avoid hyperglycemia and hypoglycemia. Severe hyperglycemia/hypoglycemia can lead to hospital admissions and be life threatening.    Gretchen ShortSpenser Natara Monfort,  FNP-C  Pediatric Specialist  [address]  [place], [id number]  Tele: [phone]

## 2018-04-30 NOTE — Progress Notes (Signed)
Chief Complaint  Patient presents with  . fasting cpe    fasting cpe, sees obgyn. had eye exam done within a year.     Paula Miller is a 22 y.o. female who presents for a complete physical.    Graduated from [employer]. Will be starting Masters program at [employer]. She brings in form to be completed.  Congestion/allergies.  She had sinus infections treated at [facility] in November and February.  She still has some congestion in throat and nose (? If allergic to her dog).  She uses claritin and flonase daily.  Using just 1 spray daily. Only mild itchy eyes/sneezing, most symptoms ongoing are related to congestion and postnasal drainage.   Vitamin D deficiency:  Level was noted to be low at 25 last year. She was prescribed 8 weeks of 50,000 IU weekly, and advised to start taking a daily supplement.  She is currently taking a calcium with D supplement once daily, as well as a MVI daily (which she believes may be the same as what she was taking last year).  Diabetes: Type 1 diabetes in poor control. H/o not giving enough boluses or checking sugars often enough.  She last saw endocrinologist in April, has f/u later this month. Lab Results  Component Value Date   HGBA1C 10.0 02/27/2018   Hypoglycemic symptoms once a week (in the 60s). Sugar was 269 this morning.  Some excessive thirst/urination when sugars are very high. Checks feet regularly, denies concerns. Denies numbness, tingling, burning in feet. Sees ophtho once yearly--she is switching to adult doctor this year; reports going every December. Denies any vision problems.  Chronic migraines without aura: last saw neuro a year ago, 04/2017.  She tapered off the topamax within the last month or two, after getting daith ear piercing.  She hasn't had a migraine since 2 days after her piercing, 03/23/18.  Poor control of DM was felt to contribute some also to her headaches per her neuro. Uses fioricet  prn. One triptan given by the neurologist caused dizziness/loopiness, the other was ineffective.    Sees GYN, scheduled for next month (Dr. Helane Rima). Will be getting pap smear at that visit.  She originally started on OCPs for heavy cycles. She currently also uses for contraception, but reports also using condoms. Past cycle was a little heavier/longer than usual, but no longer having heavy cycles regularly. GYN (at [facility]) has previously prescribed Belviq to help with the weight loss, started in 11/2015. This was stopped when she went on Topamax for migraines.   Immunization History  Administered Date(s) Administered  . DTaP 10/23/1996, 12/25/1996, 02/26/1997, 03/12/1998, 05/10/2001  . Hepatitis A 01/24/2006, 03/10/2007  . Hepatitis B 10/23/1996, 01/04/1997, 06/09/1997  . HiB (PRP-OMP) 10/23/1996, 12/25/1996, 02/26/1997, 12/11/1997  . IPV 10/23/1996, 12/25/1996, 03/12/1998, 05/10/2001  . Influenza Split 08/22/2016  . Influenza,inj,Quad PF,6+ Mos 08/08/2013  . Influenza-Unspecified 09/19/2002, 09/15/2005, 09/13/2008, 07/15/2011  . MMR 12/11/1997, 05/10/2001  . Meningococcal B, OMV 05/07/2016, 06/23/2016  . Meningococcal Polysaccharide 02/13/2009, 05/24/2014  . Td 04/28/2017  . Tdap 05/10/2007  . Varicella 09/16/1997   Hasn't had HPV vaccines. Mother does not want her to have them. NCIR was reviewed, and Varicella was marked as "immune" Last Pap smear: never, scheduled with GYN Dentist: twice yearly Ophtho: yearly in December  Exercise: Walks the dog with her dad some. Works at [employer] at home on Sundays. Milk: Uses milk in her cereal, has cheese. She takes calcium supplement once daily, plus MVI.  Past Medical History:  Diagnosis Date  . Allergy    seasonal allergies  . Goiter   . Hypoglycemia associated with diabetes ([place])   . Physical growth delay   . Type 1 diabetes mellitus in patient age 66-19 years with HbA1C goal below 7.5 diagnosed age 4  . VSD  (ventricular septal defect)     Past Surgical History:  Procedure Laterality Date  . FRENULECTOMY, LINGUAL    . periodontal surgery      Social History   Socioeconomic History  . Marital status: Single    Spouse name: Not on file  . Number of children: 0  . Years of education: 12+  . Highest education level: Not on file  Occupational History  . Occupation: Ship broker- [employer]  Social Needs  . Financial resource strain: Not on file  . Food insecurity:    Worry: Not on file    Inability: Not on file  . Transportation needs:    Medical: Not on file    Non-medical: Not on file  Tobacco Use  . Smoking status: Never Smoker  . Smokeless tobacco: Never Used  Substance and Sexual Activity  . Alcohol use: Yes    Alcohol/week: 0.6 oz    Types: 1 Glasses of wine per week    Comment: 1 glass of wine/week  . Drug use: No  . Sexual activity: Yes    Partners: Male    Birth control/protection: Condom, Pill  Lifestyle  . Physical activity:    Days per week: Not on file    Minutes per session: Not on file  . Stress: Not on file  Relationships  . Social connections:    Talks on phone: Not on file    Gets together: Not on file    Attends religious service: Not on file    Active member of club or organization: Not on file    Attends meetings of clubs or organizations: Not on file    Relationship status: Not on file  . Intimate partner violence:    Fear of current or ex partner: Not on file    Emotionally abused: Not on file    Physically abused: Not on file    Forced sexual activity: Not on file  Other Topics Concern  . Not on file  Social History Narrative   Graduated from [employer]   Will be attending [employer] for [employer] live in [place], 1 younger brother.    (mother--[name] Goughnour, grandparents are the [place])   1 dog at home      Caffeine use: daily, 1 cup of coffee    Family History  Problem Relation Age of Onset  . Allergies Mother   . Thyroid disease  Mother   . Thyroid disease Maternal Grandmother   . Parkinson's disease Maternal Grandmother   . Arthritis Maternal Grandfather   . Arthritis Paternal Grandmother   . Heart attack Paternal Grandfather   . Cancer Neg Hx     Outpatient Encounter Medications as of 05/01/2018  Medication Sig Note  . butalbital-acetaminophen-caffeine (FIORICET, ESGIC) 50-325-40 MG tablet Reported on 04/22/2016 05/01/2018: Uses prn migraines, none in over a month  . Calcium Carb-Cholecalciferol (CALCIUM-VITAMIN D) 500-200 MG-UNIT tablet Take 1 tablet by mouth daily. 04/28/2017: Unsure of dose. Target brand.  Takes one daily  . glucose blood (BAYER CONTOUR NEXT TEST) test strip CHECK BLOOD SURGAR 10 TIMES DAILY   . insulin glulisine (APIDRA) 100 UNIT/ML injection USE 300 UNITS IN INSULIN  PUMP  EVERY 48 HOURS   . Insulin Pen Needle 31G X 5 MM MISC Check Blood glucose 10x day   . loratadine (CLARITIN) 10 MG tablet Take 10 mg by mouth daily.   . Multiple Vitamin (MULTIVITAMIN) tablet Take 1 tablet by mouth daily.     Cyndie Chime Estrad-Fe Biphas (LO LOESTRIN FE PO) Take by mouth.   . Continuous Blood Gluc Sensor (Baker) MISC Use a sensor every 10 days NDC [phone] (Patient not taking: Reported on 05/01/2018) 05/01/2018: Had problems with skin sensitivity  . glucagon 1 MG injection Use for Severe Hypoglycemia . Inject 95m intramuscularly (Patient not taking: Reported on 05/01/2018)   . Insulin Glargine (LANTUS SOLOSTAR) 100 UNIT/ML Solostar Pen Use up to 50 units daily if pump fails. (Patient not taking: Reported on 05/01/2018) 04/28/2017: Uses only for pump failure  . insulin lispro (HUMALOG KWIKPEN) 100 UNIT/ML KiwkPen Up to 50 units/day if pump fails (Patient not taking: Reported on 05/01/2018)   . ondansetron (ZOFRAN-ODT) 4 MG disintegrating tablet Take 1 tablet (4 mg total) by mouth every 8 (eight) hours as needed for nausea or vomiting. (Patient not taking: Reported on 05/01/2018)   .  [DISCONTINUED] topiramate (TOPAMAX) 100 MG tablet Take 1 tablet (100 mg total) by mouth at bedtime.   . [DISCONTINUED] Vitamin D, Ergocalciferol, (DRISDOL) 50000 units CAPS capsule Take 1 capsule (50,000 Units total) by mouth every 7 (seven) days. (Patient not taking: Reported on 07/14/2017)    No facility-administered encounter medications on file as of 05/01/2018.     No Known Allergies  ROS: The patient denies anorexia, fever, weight changes, vision changes, decreased hearing, ear pain, sore throat, breast concerns, chest pain, palpitations, dizziness, syncope, dyspnea on exertion, cough, swelling, nausea, vomiting, diarrhea, constipation, abdominal pain, melena, hematochezia, indigestion/heartburn, hematuria, incontinence, dysuria, irregular menstrual cycles, vaginal discharge, odor or itch, genital lesions, joint pains, numbness, tingling, weakness, tremor, suspicious skin lesions, abnormal bleeding/bruising, or enlarged lymph nodes. Denies depresssion, insomnia Congestion/sinuses fairly chronically. No purulent drainage. Headaches improved s/p daith piercing. Polydipsia and polyuria with high blood sugars.    PHYSICAL EXAM:  BP 110/70   Pulse 98   Temp (!) 97.3 F (36.3 C) (Oral)   Ht _0  (1.626 m)   Wt 117 lb 3.2 oz (53.2 kg)   LMP 04/17/2018   SpO2 98%   BMI 20.12 kg/m   Wt Readings from Last 3 Encounters:  05/01/18 117 lb 3.2 oz (53.2 kg)  02/27/18 115 lb 6.4 oz (52.3 kg)  12/19/17 116 lb 6.4 oz (52.8 kg)    Vision 20/25 in each eye, 20/20 together  General Appearance:  Alert, cooperative, no distress, appears stated age  Head:  Normocephalic, without obvious abnormality, atraumatic  Eyes:  PERRL, conjunctiva/corneas clear, EOM's intact, fundi benign  Ears:  Normal TM's and external ear canals  Nose: Nares normal, mucosa is mildl-mod edematous, no erythema, drainage or sinus tenderness  Throat: Lips, mucosa, and tongue normal; teeth and gums normal.  Tonsils are mildly enlarged bilaterally, no erythema or exudate.  Inferior portion of uvula is thicker.  Neck: Supple, no lymphadenopathy; thyroid: no enlargement/tenderness/nodules; no carotid bruit or JVD  Back:  Spine nontender, no curvature, ROM normal, no CVA tenderness  Lungs:  Clear to auscultation bilaterally without wheezes, rales or ronchi; respirations unlabored  Chest Wall:  No tenderness or deformity  Heart:  Regular rate and rhythm, S1 and S2 normal, no murmur, rub or gallop  Breast Exam:  Deferred to GYN  Abdomen:  Soft, non-tender, nondistended, normoactive bowel sounds,  no masses, no hepatosplenomegaly  Genitalia:  deferred to GYN  Rectal:  Not performed  Extremities: No clubbing, cyanosis or edema. Normal monofilament exam  Pulses: 2+ and symmetric all extremities  Skin: Skin color, texture, turgor normal, no rashes.  Lymph nodes: Cervical, supraclavicular, and inguinal nodes normal  Neurologic: CNII-XII intact, normal strength, sensation and gait; reflexes 2+ and symmetric throughout    Psych:  Normal mood, affect, eye contact, speech, hygiene and grooming.She had blood drawn upon arrival, per request, and was feeling somewhat shaky/weak for the first portion of visit, improved during visit.   ASSESSMENT/PLAN:  Annual physical exam - Plan: CBC with Differential/Platelet, Comprehensive metabolic panel, Lipid panel, VITAMIN D 25 Hydroxy (Vit-D Deficiency, Fractures), TSH, Visual acuity screening, POCT Urinalysis DIP (Proadvantage Device)  Uncontrolled type 1 diabetes mellitus with diabetic autonomic neuropathy ([place]) - encouraged compliance with boluses to achieve better overall diabetes control; cont f/u with endo - Plan: Comprehensive metabolic panel, TSH, Microalbumin / creatinine urine ratio  Migraine without aura and without status migrainosus, not intractable - improved s/p daith piercing; no  longer on topamax. has fioricet for prn use  Vitamin D deficiency - due for recheck; if remains low, needs to take add'l vitamin D supplement - Plan: VITAMIN D 25 Hydroxy (Vit-D Deficiency, Fractures)  Seasonal allergic rhinitis, unspecified trigger - discussed proper flonase technique, and increasing dose to 2 sprays per nostril  Visit for TB skin test - Plan: TB Skin Test  Return in 2-3 days for PPD reading.  Discussed monthly self breast exams, at least 30 minutes of aerobic activity at least 5 days/week, weight bearing exercise at least 2x/week; proper sunscreen use reviewed; healthy diet, including goals of calcium and vitamin D intake reviewed; regular seatbelt use; changing batteries in smoke detectors. Immunization recommendations discussed--refuses Gardisil series. Encouraged her to consider this (is an adult), and to discuss with her GYN at upcoming visit.   Need ophtho records --Dr. Annamaria Boots

## 2018-05-01 ENCOUNTER — Ambulatory Visit (INDEPENDENT_AMBULATORY_CARE_PROVIDER_SITE_OTHER): Payer: 59 | Admitting: Family Medicine

## 2018-05-01 ENCOUNTER — Encounter: Payer: Self-pay | Admitting: Family Medicine

## 2018-05-01 VITALS — BP 110/70 | HR 98 | Temp 97.3°F | Ht 64.0 in | Wt 117.2 lb

## 2018-05-01 DIAGNOSIS — J302 Other seasonal allergic rhinitis: Secondary | ICD-10-CM

## 2018-05-01 DIAGNOSIS — E1043 Type 1 diabetes mellitus with diabetic autonomic (poly)neuropathy: Secondary | ICD-10-CM

## 2018-05-01 DIAGNOSIS — Z0000 Encounter for general adult medical examination without abnormal findings: Secondary | ICD-10-CM | POA: Diagnosis not present

## 2018-05-01 DIAGNOSIS — E1065 Type 1 diabetes mellitus with hyperglycemia: Secondary | ICD-10-CM

## 2018-05-01 DIAGNOSIS — IMO0002 Reserved for concepts with insufficient information to code with codable children: Secondary | ICD-10-CM

## 2018-05-01 DIAGNOSIS — Z111 Encounter for screening for respiratory tuberculosis: Secondary | ICD-10-CM

## 2018-05-01 DIAGNOSIS — G43009 Migraine without aura, not intractable, without status migrainosus: Secondary | ICD-10-CM

## 2018-05-01 DIAGNOSIS — E559 Vitamin D deficiency, unspecified: Secondary | ICD-10-CM

## 2018-05-01 LAB — POCT URINALYSIS DIP (PROADVANTAGE DEVICE)
BILIRUBIN UA: NEGATIVE
Blood, UA: NEGATIVE
Glucose, UA: 500 mg/dL — AB
Leukocytes, UA: NEGATIVE
NITRITE UA: NEGATIVE
Protein Ur, POC: NEGATIVE mg/dL
Specific Gravity, Urine: 1.02
UUROB: NEGATIVE
pH, UA: 6 (ref 5.0–8.0)

## 2018-05-01 NOTE — Patient Instructions (Addendum)
  HEALTH MAINTENANCE RECOMMENDATIONS:  It is recommended that you get at least 30 minutes of aerobic exercise at least 5 days/week (for weight loss, you may need as much as 60-90 minutes). This can be any activity that gets your heart rate up. This can be divided in 10-15 minute intervals if needed, but try and build up your endurance at least once a week.  Weight bearing exercise is also recommended twice weekly.  Eat a healthy diet with lots of vegetables, fruits and fiber.  "Colorful" foods have a lot of vitamins (ie green vegetables, tomatoes, red peppers, etc).  Limit sweet tea, regular sodas and alcoholic beverages, all of which has a lot of calories and sugar.  Up to 1 alcoholic drink daily may be beneficial for women (unless trying to lose weight, watch sugars).  Drink a lot of water.  Calcium recommendations are 1200-1500 mg daily (1500 mg for postmenopausal women or women without ovaries), and vitamin D 1000 IU daily.  This should be obtained from diet and/or supplements (vitamins), and calcium should not be taken all at once, but in divided doses.  Monthly self breast exams and yearly mammograms for women over the age of 22 is recommended.  Sunscreen of at least SPF 30 should be used on all sun-exposed parts of the skin when outside between the hours of 10 am and 4 pm (not just when at beach or pool, but even with exercise, golf, tennis, and yard work!)  Use a sunscreen that says "broad spectrum" so it covers both UVA and UVB rays, and make sure to reapply every 1-2 hours.  Remember to change the batteries in your smoke detectors when changing your clock times in the spring and fall.  Use your seat belt every time you are in a car, and please drive safely and not be distracted with cell phones and texting while driving.  We discussed increasing exercise, weight-bearing exercise. You may need additional vitamin D--we will let you know with your test results through MyChart.  Consider  increasing the Flonase dose to 2 sprays into each nostril, for better relief of your allergies.

## 2018-05-02 LAB — COMPREHENSIVE METABOLIC PANEL
ALT: 16 IU/L (ref 0–32)
AST: 12 IU/L (ref 0–40)
Albumin/Globulin Ratio: 1.6 (ref 1.2–2.2)
Albumin: 4.5 g/dL (ref 3.5–5.5)
Alkaline Phosphatase: 62 IU/L (ref 39–117)
BUN/Creatinine Ratio: 13 (ref 9–23)
BUN: 11 mg/dL (ref 6–20)
Bilirubin Total: 0.5 mg/dL (ref 0.0–1.2)
CALCIUM: 9.4 mg/dL (ref 8.7–10.2)
CO2: 19 mmol/L — ABNORMAL LOW (ref 20–29)
CREATININE: 0.85 mg/dL (ref 0.57–1.00)
Chloride: 101 mmol/L (ref 96–106)
GFR calc Af Amer: 113 mL/min/{1.73_m2} (ref >59)
GFR, EST NON AFRICAN AMERICAN: 98 mL/min/{1.73_m2} (ref >59)
GLUCOSE: 286 mg/dL — AB (ref 65–99)
Globulin, Total: 2.8 g/dL (ref 1.5–4.5)
POTASSIUM: 4.3 mmol/L (ref 3.5–5.2)
Sodium: 141 mmol/L (ref 134–144)
Total Protein: 7.3 g/dL (ref 6.0–8.5)

## 2018-05-02 LAB — MICROALBUMIN / CREATININE URINE RATIO
Creatinine, Urine: 110.7 mg/dL
MICROALBUM., U, RANDOM: 23.4 ug/mL
Microalb/Creat Ratio: 21.1 mg/g creat (ref 0.0–30.0)

## 2018-05-02 LAB — CBC WITH DIFFERENTIAL/PLATELET
Basophils Absolute: 0.1 10*3/uL (ref 0.0–0.2)
Basos: 1 %
EOS (ABSOLUTE): 0.3 10*3/uL (ref 0.0–0.4)
EOS: 4 %
Hematocrit: 44.5 % (ref 34.0–46.6)
Hemoglobin: 14.9 g/dL (ref 11.1–15.9)
IMMATURE GRANULOCYTES: 1 %
Immature Grans (Abs): 0 10*3/uL (ref 0.0–0.1)
Lymphocytes Absolute: 2.5 10*3/uL (ref 0.7–3.1)
Lymphs: 29 %
MCH: 29.9 pg (ref 26.6–33.0)
MCHC: 33.5 g/dL (ref 31.5–35.7)
MCV: 89 fL (ref 79–97)
MONOS ABS: 0.5 10*3/uL (ref 0.1–0.9)
Monocytes: 6 %
NEUTROS PCT: 59 %
Neutrophils Absolute: 5.1 10*3/uL (ref 1.4–7.0)
PLATELETS: 302 10*3/uL (ref 150–450)
RBC: 4.98 x10E6/uL (ref 3.77–5.28)
RDW: 12.5 % (ref 12.3–15.4)
WBC: 8.5 10*3/uL (ref 3.4–10.8)

## 2018-05-02 LAB — TSH: TSH: 2.06 u[IU]/mL (ref 0.450–4.500)

## 2018-05-02 LAB — LIPID PANEL
CHOL/HDL RATIO: 2 ratio (ref 0.0–4.4)
Cholesterol, Total: 150 mg/dL (ref 100–199)
HDL: 76 mg/dL (ref >39)
LDL CALC: 42 mg/dL (ref 0–99)
TRIGLYCERIDES: 162 mg/dL — AB (ref 0–149)
VLDL Cholesterol Cal: 32 mg/dL (ref 5–40)

## 2018-05-02 LAB — VITAMIN D 25 HYDROXY (VIT D DEFICIENCY, FRACTURES): Vit D, 25-Hydroxy: 43.1 ng/mL (ref 30.0–100.0)

## 2018-05-02 NOTE — Progress Notes (Signed)
[facility] this records

## 2018-05-04 LAB — TB SKIN TEST
Induration: 0 mm
TB Skin Test: NEGATIVE

## 2018-05-04 LAB — HM DIABETES EYE EXAM

## 2018-05-05 ENCOUNTER — Telehealth: Payer: Self-pay | Admitting: Family Medicine

## 2018-05-05 NOTE — Telephone Encounter (Signed)
Received requested eye exam from Pediatric ophthalmology. Sending back for review.

## 2018-05-08 ENCOUNTER — Encounter: Payer: Self-pay | Admitting: *Deleted

## 2018-05-11 ENCOUNTER — Telehealth (INDEPENDENT_AMBULATORY_CARE_PROVIDER_SITE_OTHER): Payer: Self-pay | Admitting: Family

## 2018-05-11 ENCOUNTER — Other Ambulatory Visit (INDEPENDENT_AMBULATORY_CARE_PROVIDER_SITE_OTHER): Payer: Self-pay | Admitting: *Deleted

## 2018-05-11 MED ORDER — BASAGLAR KWIKPEN 100 UNIT/ML ~~LOC~~ SOPN: PEN_INJECTOR | SUBCUTANEOUS | 5 refills | Status: DC

## 2018-05-11 MED ORDER — INSULIN LISPRO 100 UNIT/ML ~~LOC~~ SOLN: SUBCUTANEOUS | 5 refills | Status: DC

## 2018-05-11 NOTE — Telephone Encounter (Signed)
°  Who's calling (name and relationship to patient) : Misty StanleyLisa (Mother) Best contact number: [phone] Provider they see: Mom requested refills on pt's Balsalgar and Humalog.   Reason for call:  [facility] [address]

## 2018-05-11 NOTE — Telephone Encounter (Signed)
LVM that scripts sent.

## 2018-05-17 ENCOUNTER — Encounter: Payer: Self-pay | Admitting: Neurology

## 2018-05-17 ENCOUNTER — Other Ambulatory Visit: Payer: Self-pay | Admitting: Neurology

## 2018-05-17 ENCOUNTER — Ambulatory Visit (INDEPENDENT_AMBULATORY_CARE_PROVIDER_SITE_OTHER): Payer: 59 | Admitting: Neurology

## 2018-05-17 ENCOUNTER — Telehealth: Payer: Self-pay | Admitting: *Deleted

## 2018-05-17 DIAGNOSIS — R112 Nausea with vomiting, unspecified: Secondary | ICD-10-CM | POA: Diagnosis not present

## 2018-05-17 MED ORDER — ONDANSETRON 4 MG PO TBDP: 4.0000 mg | ORAL_TABLET | Freq: Three times a day (TID) | ORAL | 12 refills | Status: DC | PRN

## 2018-05-17 MED ORDER — BUTALBITAL-APAP-CAFFEINE 50-325-40 MG PO TABS: 1.0000 | ORAL_TABLET | Freq: Four times a day (QID) | ORAL | 5 refills | Status: DC | PRN

## 2018-05-17 MED ORDER — BUTALBITAL-APAP-CAFFEINE 50-325-40 MG PO TABS: ORAL_TABLET | ORAL | 5 refills | Status: DC

## 2018-05-17 NOTE — Telephone Encounter (Signed)
Thanks, resent with update

## 2018-05-17 NOTE — Telephone Encounter (Signed)
Received request from pharmacy to clarify prescription instructions for Fioricet.

## 2018-05-17 NOTE — Progress Notes (Signed)
[facility]    Provider:  Dr Jaynee Eagles Referring Provider: Rita Ohara, MD Primary Care Physician:  Rita Ohara, MD  CC: headaches  Interval history May 17, 2018: Patient continues to be stable, she weaned herself off the Topamax because she started taking low Loestrin and she also started Lexapro which has helped. She hasn't had one since May 13th, a few small headaches. Hasn't used maxalt for a while. Triptans don;t do well. 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   Interval history 05/17/2017:  She is feeling better. She has 2-3 headaches a month and they last up to one day but can be resolved quckly with the acute medication. No side effects to the Topiramate.   Interval history 11/09/2016: She is on topiramate 22m daily. The zofran works. She didn;t like the imitrex. She is still having headaches at least 6-10 significant headaches during the month. She is taking the topamax every night. No side effects. She is having less headaches and the severity is improved. The quality is the same.  She has musculoskeletal neck pain.   Meds; Topiramate, zofran, imitrex(did not like)  HUOH:FGBMSXJDThomasis a 22y.o.femalehere as a referral from Dr. KHayden Pedroheadaches. Mother here who provides much information as well. 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 Can start unilaterally or whole, sometimes dull often throbbing and pounding, nausea, light sensitive, sound sensitive. Chiropractor has helped a little bit. They can last a whole day. She has fioricet, this is the first thing she has tried. It works. She has headaches every day. She has had to leave work early and stay home from class. She has a headache every day, possibly 10 migrainous monthlywith 2 serious necessitating medication every month. Migraines  started as a child, saw Dr. HGaynell Face Mother with migraines. FHx astrocytoma. Unknown triggers or food triggers.  Reviewed notes, labs and imaging from outside physicians, which showed:  Hemoglobin A1c 9.7 on May 2017. BUN 12 and creatinine 0.76 in February 2017. TSH 1.173 very 25th 2017.   Review of Systems: Patient complains of symptoms per HPI as well as the following symptoms: Blurred vision, murmur, headache. Pertinent negatives per HPI. All others negative.  Social History   Socioeconomic History  . Marital status: Single    Spouse name: Not on file  . Number of children: 0  . Years of education: 12+  . Highest education level: Not on file  Occupational History  . Occupation: SShip broker [employer]  Social Needs  . Financial resource strain: Not on file  . Food insecurity:    Worry: Not on file    Inability: Not on file  . Transportation needs:    Medical: Not on file    Non-medical: Not on file  Tobacco Use  . Smoking status: Never Smoker  . Smokeless tobacco: Never Used  Substance and Sexual Activity  . Alcohol use: Yes    Alcohol/week: 0.6 oz    Types: 1 Glasses of wine per week    Comment: 1 glass of wine/week  . Drug use: No  . Sexual activity: Yes    Partners: Male    Birth control/protection: Condom, Pill  Lifestyle  . Physical activity:    Days per week: Not on file    Minutes per session: Not on file  . Stress: Not on file  Relationships  . Social connections:    Talks on  phone: Not on file    Gets together: Not on file    Attends religious service: Not on file    Active member of club or organization: Not on file    Attends meetings of clubs or organizations: Not on file    Relationship status: Not on file  . Intimate partner violence:    Fear of current or ex partner: Not on file    Emotionally abused: Not on file    Physically abused: Not on file    Forced sexual activity: Not on file  Other Topics Concern  . Not on file  Social History  Narrative   Graduated from [employer].   Will be attending [employer] for [employer], commissioning psychology)   Parents live in [place], 1 younger brother.    (mother--[name] Newland, grandparents are the [place])   1 dog at home      Caffeine use: daily, 1 cup of coffee    Family History  Problem Relation Age of Onset  . Allergies Mother   . Thyroid disease Mother   . Thyroid disease Maternal Grandmother   . Parkinson's disease Maternal Grandmother   . Arthritis Maternal Grandfather   . Arthritis Paternal Grandmother   . Heart attack Paternal Grandfather   . Cancer Neg Hx     Past Medical History:  Diagnosis Date  . Allergy    seasonal allergies  . Goiter   . Hypoglycemia associated with diabetes ([place])   . Physical growth delay   . Type 1 diabetes mellitus in patient age 22-19 years with HbA1C goal below 7.5 diagnosed age 61  . VSD (ventricular septal defect)     Past Surgical History:  Procedure Laterality Date  . FRENULECTOMY, LINGUAL    . periodontal surgery      Current Outpatient Medications  Medication Sig Dispense Refill  . Calcium Carb-Cholecalciferol (CALCIUM-VITAMIN D) 500-200 MG-UNIT tablet Take 1 tablet by mouth daily.    . Continuous Blood Gluc Sensor (FREESTYLE LIBRE SENSOR SYSTEM) MISC Use a sensor every 10 days NDC 4432178940 9 each 3  . escitalopram (LEXAPRO) 10 MG tablet Take 10 mg by mouth daily.    Marland Kitchen glucose blood (BAYER CONTOUR NEXT TEST) test strip CHECK BLOOD SURGAR 10 TIMES DAILY 900 each 4  . Insulin Glargine (BASAGLAR KWIKPEN) 100 UNIT/ML SOPN Use up to 50 units daily. 15 mL 5  . insulin glulisine (APIDRA) 100 UNIT/ML injection USE 300 UNITS IN INSULIN  PUMP EVERY 48 HOURS 12 vial 3  . Insulin Pen Needle 31G X 5 MM MISC Check Blood glucose 10x day 900 each 3  . loratadine (CLARITIN) 10 MG tablet Take 10 mg by mouth daily.    . Multiple Vitamin (MULTIVITAMIN) tablet Take 1 tablet by mouth daily.      Cyndie Chime Estrad-Fe Biphas  (LO LOESTRIN FE PO) Take by mouth.    . ondansetron (ZOFRAN-ODT) 4 MG disintegrating tablet Take 1 tablet (4 mg total) by mouth every 8 (eight) hours as needed for nausea or vomiting. 30 tablet 12  . butalbital-acetaminophen-caffeine (FIORICET, ESGIC) 50-325-40 MG tablet Reported on 04/22/2016  3  . glucagon 1 MG injection Use for Severe Hypoglycemia . Inject 72m intramuscularly (Patient not taking: Reported on 05/01/2018) 2 kit 3  . insulin lispro (HUMALOG KWIKPEN) 100 UNIT/ML KiwkPen Up to 50 units/day if pump fails (Patient not taking: Reported on 05/01/2018) 5 pen 6   No current facility-administered medications for this visit.     Allergies as of 05/17/2018  . (  No Known Allergies)    Vitals: BP 107/70 (BP Location: Left Arm, Patient Position: Sitting)   Pulse 97   Ht 5' 4"  (1.626 m)   Wt 121 lb (54.9 kg)   LMP 05/11/2018   BMI 20.77 kg/m  Last Weight:  Wt Readings from Last 1 Encounters:  05/17/18 121 lb (54.9 kg)   Last Height:   Ht Readings from Last 1 Encounters:  05/17/18 5' 4"  (1.626 m)   Physical exam: Exam: Gen: NAD, conversant, well nourised, well groomed                     CV: RRR, no MRG. No Carotid Bruits. No peripheral edema, warm, nontender Eyes: Conjunctivae clear without exudates or hemorrhage  Neuro: Detailed Neurologic Exam  Speech:    Speech is normal; fluent and spontaneous with normal comprehension.  Cognition:    The patient is oriented to person, place, and time;     recent and remote memory intact;     language fluent;     normal attention, concentration,     fund of knowledge Cranial Nerves:    The pupils are equal, round, and reactive to light. The fundi are normal and spontaneous venous pulsations are present. Visual fields are full to finger confrontation. Extraocular movements are intact. Trigeminal sensation is intact and the muscles of mastication are normal. The face is symmetric. The palate elevates in the midline. Hearing intact. Voice  is normal. Shoulder shrug is normal. The tongue has normal motion without fasciculations.   Coordination:    Normal finger to nose and heel to shin. Normal rapid alternating movements.   Gait:    Heel-toe and tandem gait are normal.   Motor Observation:    No asymmetry, no atrophy, and no involuntary movements noted. Tone:    Normal muscle tone.    Posture:    Posture is normal. normal erect    Strength:    Strength is V/V in the upper and lower limbs.      Sensation: intact to LT     Reflex Exam:  DTR's:    Deep tendon reflexes in the upper and lower extremities are normal bilaterally.   Toes:    The toes are downgoing bilaterally.   Clonus:    Clonus is absent.   Assessment/Plan:22 year old with migraines without aura.   - Doing well. Triptans do not work she uses Fioricet sparingly for acute headaches. Discussed medication overuse rebound so dont use more than 10x a month - she stopped topiramate and doing well - doing well on lexapro, continue for mood treatment and migraine preventative. - zofran for nausea   Nausea: Zofran prn. Improved.  Discussed side effects as per patient instructions and teratogenicity Discussed: To prevent or relieve headaches, try the following: Cool Compress. Lie down and place a cool compress on your head.  Avoid headache triggers. If certain foods or odors seem to have triggered your migraines in the past, avoid them. A headache diary might help you identify triggers.  Include physical activity in your daily routine. Try a daily walk or other moderate aerobic exercise.  Manage stress. Find healthy ways to cope with the stressors, such as delegating tasks on your to-do list.  Practice relaxation techniques. Try deep breathing, yoga, massage and visualization.  Eat regularly. Eating regularly scheduled meals and maintaining a healthy diet might help prevent headaches. Also, drink plenty of fluids.  Follow a regular sleep schedule.  Sleep deprivation might contribute  to headaches Consider biofeedback. With this mind-body technique, you learn to control certain bodily functions - such as muscle tension, heart rate and blood pressure - to prevent headaches or reduce headache pain.    Proceed to emergency room if you experience new or worsening symptoms or symptoms do not resolve, if you have new neurologic symptoms or if headache is severe, or for any concerning symptom.   Provided education and documentation from American headache Society toolbox including articles on: chronic migraine medication overuse headache, chronic migraines, prevention of migraines, behavioral and other nonpharmacologic treatments for headache.  Sarina Ill, MD   [facility] [address] [place], [place] [address]  Phone [phone] Fax [phone]  A total of 15 minutes was spent face-to-face with this patient. Over half this time was spent on counseling patient on the migraine diagnosis and different diagnostic and therapeutic options available.

## 2018-05-18 ENCOUNTER — Encounter (INDEPENDENT_AMBULATORY_CARE_PROVIDER_SITE_OTHER): Payer: Self-pay | Admitting: Family

## 2018-05-18 ENCOUNTER — Ambulatory Visit (INDEPENDENT_AMBULATORY_CARE_PROVIDER_SITE_OTHER): Payer: 59 | Admitting: Family

## 2018-05-18 VITALS — BP 112/68 | HR 96 | Ht 64.25 in | Wt 119.8 lb

## 2018-05-18 DIAGNOSIS — F54 Psychological and behavioral factors associated with disorders or diseases classified elsewhere: Secondary | ICD-10-CM | POA: Diagnosis not present

## 2018-05-18 DIAGNOSIS — R739 Hyperglycemia, unspecified: Secondary | ICD-10-CM

## 2018-05-18 DIAGNOSIS — Z4681 Encounter for fitting and adjustment of insulin pump: Secondary | ICD-10-CM

## 2018-05-18 DIAGNOSIS — E10649 Type 1 diabetes mellitus with hypoglycemia without coma: Secondary | ICD-10-CM

## 2018-05-18 DIAGNOSIS — E1065 Type 1 diabetes mellitus with hyperglycemia: Secondary | ICD-10-CM

## 2018-05-18 DIAGNOSIS — R7309 Other abnormal glucose: Secondary | ICD-10-CM

## 2018-05-18 DIAGNOSIS — IMO0001 Reserved for inherently not codable concepts without codable children: Secondary | ICD-10-CM

## 2018-05-18 LAB — POCT GLYCOSYLATED HEMOGLOBIN (HGB A1C): Hemoglobin A1C: 10.7 % — AB (ref 4.0–5.6)

## 2018-05-18 LAB — POCT GLUCOSE (DEVICE FOR HOME USE): POC GLUCOSE: 273 mg/dL — AB (ref 70–99)

## 2018-05-18 NOTE — Patient Instructions (Signed)
-   You need to bolus!   - Reminders set for 8-9am   - 12-1 pm   - 630-730 pm   - Try to get 2 blood sugar checks every day.   - Goal is 4 per day   - Try new pump sites.   - Look at getting Dexcom CGM---> this works without scanning and will improve compliance.   - Follow up in 2 months.

## 2018-05-18 NOTE — Progress Notes (Signed)
Pediatric Endocrinology Diabetes Consultation Follow-up Visit  Paula Miller 24-Nov-1995 [id number]  Chief Complaint: Follow-up type 1 diabetes   [name], MD   HPI: Paula Miller  is a 22 y.o. female presenting for follow-up of type 1 diabetes.  1. Paula Miller was diagnosed with new onset type 1 diabetes mellitus, dehydration, and ketonuria on 06/09/2005. She was initially treated with a multiple daily injection regimen of Lantus and Novolog insulins.  We converted her to a Medtronic insulin pump in November of 2007.  Although the patient does have a palpable goiter, she has remained euthyroid. Her CMPs, lipid panels, and urinary microalbumin to creatinine ratios have always been normal.   2. Since last visit to [facility] on 02/2018, she has been well.  No ER visits or hospitalizations.   Her Medtronic 530g insulin pump stopped working 2 months ago so she got a replacement. She is trying to check at least once every day and feels like she is meeting her goal. She continues to struggle with bolusing and frequently goes days without giving a bolus. She is changing her pump site every 4-5 days and only uses her hips. She is interested in getting a Dexcom CGM but needs to speak with her parents first. 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 She reports having occasional morning hypoglycemia.   Insulin regimen: Medtronic Insulin pump Basal Rates 12AM 0.825   4am  0.80   630am  1.45   10am  1.95   9pm  1.50    Insulin to Carbohydrate Ratio 12AM  10  6am  8  11am 10  5pm 10       Insulin Sensitivity Factor 12AM  55   6am 37  10pm 55         Target Blood Glucose 12AM  150  6am 110  11pm 150          Hypoglycemia: Able to feel low blood sugars.  No glucagon needed recently.  Insulin Pump download:   - Avg Bg 302. Checking 2 x per day   - Target Range: in target 21%, above target 79% and below target 0%   - Using 38  units per day. 83% basal and 17% bolus   - Entering 45 grams of carbs per day.  Freestyle Libre CGM: Not wearing.  Med-alert ID: Not currently wearing. Injection sites: bilateral hips  Annual labs due: 04/2019 Ophthalmology due: 2018 December. Discussed importance of dilated eye exam.     3. ROS: Greater than 10 systems reviewed with pertinent positives listed in HPI, otherwise neg. Constitutional: She has good energy and appetite. Weight is stable.  Eyes: No changes in vision. Denies blurry vision. Wears glasses.  Ears/Nose/Mouth/Throat: No difficulty swallowing. No throat pain  Cardiovascular: No palpitations. No tachycardia. No chest pain Respiratory: No increased work of breathing.  Gastrointestinal: No constipation or diarrhea.  Genitourinary: No nocturia, no polyuria Neurologic: Normal sensation, no tremor Endocrine: No polydipsia.  No hyperpigmentation Psychiatric: Normal affect. Denies anxiety and depression.   Past Medical History:   Past Medical History:  Diagnosis Date  . Allergy    seasonal allergies  . Goiter   . Hypoglycemia associated with diabetes (HCC)   . Physical growth delay   . Type 1 diabetes mellitus in patient age 8-19 years with HbA1C goal below 7.5 diagnosed age 8  . VSD (ventricular septal defect)     Medications:  Outpatient Encounter Medications as of 05/18/2018  Medication Sig Note  . butalbital-acetaminophen-caffeine (FIORICET, ESGIC) 50-325-40 MG tablet Take 1 tablet by mouth every 6 (six) hours as needed for headache. Reported on 04/22/2016   . Calcium Carb-Cholecalciferol (CALCIUM-VITAMIN D) 500-200 MG-UNIT tablet Take 1 tablet by mouth daily. 04/28/2017: Unsure of dose. Target brand.  Takes one daily  . escitalopram (LEXAPRO) 10 MG tablet Take 10 mg by mouth daily.   Marland Kitchen glucose blood (BAYER CONTOUR NEXT TEST) test strip CHECK BLOOD SURGAR 10 TIMES DAILY   . Insulin Glargine (BASAGLAR KWIKPEN) 100 UNIT/ML SOPN Use up to 50 units daily. 05/18/2018:  Has in case of pump failure  . insulin glulisine (APIDRA) 100 UNIT/ML injection USE 300 UNITS IN INSULIN  PUMP EVERY 48 HOURS   . Insulin Pen Needle 31G X 5 MM MISC Check Blood glucose 10x day   . loratadine (CLARITIN) 10 MG tablet Take 10 mg by mouth daily.   . Multiple Vitamin (MULTIVITAMIN) tablet Take 1 tablet by mouth daily.     Kathrynn Running Estrad-Fe Biphas (LO LOESTRIN FE PO) Take by mouth.   . ondansetron (ZOFRAN-ODT) 4 MG disintegrating tablet Take 1 tablet (4 mg total) by mouth every 8 (eight) hours as needed for nausea or vomiting.   . Continuous Blood Gluc Sensor (FREESTYLE LIBRE SENSOR SYSTEM) MISC Use a sensor every 10 days NDC [id number] (Patient not taking: Reported on 05/18/2018) 05/01/2018: Had problems with skin sensitivity  . glucagon 1 MG injection Use for Severe Hypoglycemia . Inject 1mg  intramuscularly (Patient not taking: Reported on 05/01/2018) 05/17/2018: Has if needed  . [DISCONTINUED] butalbital-acetaminophen-caffeine (FIORICET, ESGIC) 50-325-40 MG tablet Reported on 04/22/2016 05/01/2018: Uses prn migraines, none in over a month  . [DISCONTINUED] Insulin Glargine (LANTUS SOLOSTAR) 100 UNIT/ML Solostar Pen Use up to 50 units daily if pump fails. (Patient not taking: Reported on 05/01/2018) 04/28/2017: Uses only for pump failure  . [DISCONTINUED] insulin lispro (HUMALOG KWIKPEN) 100 UNIT/ML KiwkPen Up to 50 units/day if pump fails (Patient not taking: Reported on 05/01/2018) 05/17/2018: Takes as needed  . [DISCONTINUED] ondansetron (ZOFRAN-ODT) 4 MG disintegrating tablet Take 1 tablet (4 mg total) by mouth every 8 (eight) hours as needed for nausea or vomiting.    No facility-administered encounter medications on file as of 05/18/2018.     Allergies: No Known Allergies  Surgical History: Past Surgical History:  Procedure Laterality Date  . FRENULECTOMY, LINGUAL    . periodontal surgery      Family History:  Family History  Problem Relation Age of Onset  . Allergies  Mother   . Thyroid disease Mother   . Thyroid disease Maternal Grandmother   . Parkinson's disease Maternal Grandmother   . Arthritis Maternal Grandfather   . Arthritis Paternal Grandmother   . Heart attack Paternal Grandfather   . Cancer Neg Hx       Social History: Lives with: Currently living with Parents.  [employer] college for [employer].   Physical Exam:  Vitals:   05/18/18 0832  BP: 112/68  Pulse: 96  Weight: 119 lb 12.8 oz (54.3 kg)  Height: 5' 4.25" (1.632 m)   BP 112/68   Pulse 96   Ht 5' 4.25" (1.632 m)   Wt 119 lb 12.8 oz (54.3 kg)   LMP 05/11/2018   BMI 20.40 kg/m  Body mass index: body mass index is 20.4 kg/m. Growth percentile SmartLinks can only be used for patients less than 21 years old.  Ht Readings from Last 3 Encounters:  05/18/18 5' 4.25" (1.632 m)  05/17/18 5\' 4"  (1.626 m)  05/01/18 5\' 4"  (1.626 m)   Wt Readings from Last 3 Encounters:  05/18/18 119 lb 12.8 oz (54.3 kg)  05/17/18 121 lb (54.9 kg)  05/01/18 117 lb 3.2 oz (53.2 kg)    Physical Exam   General: Well developed, well nourished female in no acute distress. She is alert and oriented.  Head: Normocephalic, atraumatic.   Eyes:  Pupils equal and round. EOMI.   Sclera white.  No eye drainage.  + glasses  Ears/Nose/Mouth/Throat: Nares patent, no nasal drainage.  Normal dentition, mucous membranes moist.   Neck: supple, no cervical lymphadenopathy, no thyromegaly Cardiovascular: regular rate, normal S1/S2, no murmurs Respiratory: No increased work of breathing.  Lungs clear to auscultation bilaterally.  No wheezes. Abdomen: soft, nontender, nondistended. Normal bowel sounds.  No appreciable masses  Extremities: warm, well perfused, cap refill < 2 sec.   Musculoskeletal: Normal muscle mass.  Normal strength Skin: warm, dry.  No rash or lesions. + insulin pump site to hip.  Neurologic: alert and oriented, normal speech, no tremor   Labs:  Results for orders placed or performed  in visit on 05/18/18  POCT Glucose (Device for Home Use)  Result Value Ref Range   Glucose Fasting, POC  70 - 99 mg/dL   POC Glucose 161273 (A) 70 - 99 mg/dl  POCT HgB [id number]  Result Value Ref Range   Hemoglobin A1C 10.7 (A) 4.0 - 5.6 %   HbA1c POC (<> result, manual entry)  4.0 - 5.6 %   HbA1c, POC (prediabetic range)  5.7 - 6.4 %   HbA1c, POC (controlled diabetic range)  0.0 - 7.0 %    Assessment/Plan: [name] is a 22 y.o. female with type 1 diabetes in poor and worsening control on Medtronic insulin pump therapy. Ellisyn frequently forgets/skips boluses leading to frequent and at times, severe, hyperglycemia. She is waiting 5 days to change pump sites instead of the recommended 3 days. Her hemoglobin A1c has increased to 10.7% which is higher then the ADA goal of <7% for adults.   1-4. DM w/o complication type I, uncontrolled (HCC)/Hyperglycemia/ Elevated A1c/hypoglycemia.  - Medtronic 530g insulin pump - Rotate pump sites every 3 days.  - Advised to try new areas for pump sites to prevent scar tissue.  - Bolus 15 minutes before eating.   - Set alarms on insulin pump to remind to bolus.  - POCT glucose  - POCT hemoglobin A1c  - Reviewed growth chart.  - gave information on Dexcom CGM   5. Insulin pump titration  - Basal Rate change   - 12am 0.825--> 0.8  - 4am: 0.80--> 0.775  - Reminders set on pump for missed bolus   - 8-9am   - 12-1pm  - 630-730 pm.   6. Maladaptive health behaviors affecting medical condition - Discussed barriers to care.  - Encouraged follow up with behavioral health  - Discussed possible complications of uncontrolled T1DM - Set reminders on insulin pump for bolus alerts.    Follow-up:  2 months    When a patient is on insulin, intensive monitoring of blood glucose levels is necessary to avoid hyperglycemia and hypoglycemia. Severe hyperglycemia/hypoglycemia can lead to hospital admissions and be life threatening.    Gretchen ShortSpenser Dakarri Kessinger,  FNP-C   Pediatric Specialist  [address]  [place], [id number]  Tele: [phone]

## 2018-05-24 ENCOUNTER — Other Ambulatory Visit (INDEPENDENT_AMBULATORY_CARE_PROVIDER_SITE_OTHER): Payer: Self-pay | Admitting: *Deleted

## 2018-05-24 ENCOUNTER — Telehealth (INDEPENDENT_AMBULATORY_CARE_PROVIDER_SITE_OTHER): Payer: Self-pay

## 2018-05-24 DIAGNOSIS — R51 Headache: Secondary | ICD-10-CM | POA: Diagnosis not present

## 2018-05-24 NOTE — Telephone Encounter (Signed)
Received fax from pharmacy requesting Humalog Kwikpens in case of pump failure. Contacted pharmacy to ensure this was correct as patient uses Apidra in pump. Pharmacist states this is what was requested, and it is covered by insurance. Gave verbal refill for Humalog.   Called patient and left a voicemail letting patient know the refills were sent in, and let her know if she needs any sliding scale information to give us a call.

## 2018-06-02 ENCOUNTER — Encounter (INDEPENDENT_AMBULATORY_CARE_PROVIDER_SITE_OTHER): Payer: Self-pay | Admitting: Family

## 2018-06-02 ENCOUNTER — Encounter (INDEPENDENT_AMBULATORY_CARE_PROVIDER_SITE_OTHER): Payer: Self-pay | Admitting: *Deleted

## 2018-07-04 ENCOUNTER — Encounter (INDEPENDENT_AMBULATORY_CARE_PROVIDER_SITE_OTHER): Payer: Self-pay | Admitting: Family

## 2018-07-04 ENCOUNTER — Ambulatory Visit (INDEPENDENT_AMBULATORY_CARE_PROVIDER_SITE_OTHER): Payer: 59 | Admitting: Family

## 2018-07-04 VITALS — BP 108/68 | HR 86 | Ht 64.45 in | Wt 119.6 lb

## 2018-07-04 DIAGNOSIS — E1065 Type 1 diabetes mellitus with hyperglycemia: Secondary | ICD-10-CM

## 2018-07-04 DIAGNOSIS — IMO0001 Reserved for inherently not codable concepts without codable children: Secondary | ICD-10-CM

## 2018-07-04 DIAGNOSIS — Z9641 Presence of insulin pump (external) (internal): Secondary | ICD-10-CM | POA: Diagnosis not present

## 2018-07-04 DIAGNOSIS — R739 Hyperglycemia, unspecified: Secondary | ICD-10-CM | POA: Diagnosis not present

## 2018-07-04 DIAGNOSIS — F54 Psychological and behavioral factors associated with disorders or diseases classified elsewhere: Secondary | ICD-10-CM | POA: Diagnosis not present

## 2018-07-04 LAB — POCT GLUCOSE (DEVICE FOR HOME USE): POC Glucose: 191 mg/dl — AB (ref 70–99)

## 2018-07-04 NOTE — Progress Notes (Signed)
Pediatric Endocrinology Diabetes Consultation Follow-up Visit  Noe GensMargaret Hurrell 05-28-1996 [id number]  Chief Complaint: Follow-up type 1 diabetes   [name], Eve, MD   HPI: Paula Miller  is a 22 y.o. female presenting for follow-up of type 1 diabetes.  1. Paula Miller was diagnosed with new onset type 1 diabetes mellitus, dehydration, and ketonuria on 06/09/2005. She was initially treated with a multiple daily injection regimen of Lantus and Novolog insulins.  We converted her to a Medtronic insulin pump in November of 2007.  Although the patient does have a palpable goiter, she has remained euthyroid. Her CMPs, lipid panels, and urinary microalbumin to creatinine ratios have always been normal.   2. Since last visit to [facility] on 04/2018, she has been well.  No ER visits or hospitalizations.   She has not made many changes to her diabetes care since last visit. She is planning to start a Masters program and has been busy with other life circumstances. She knows that her blood sugars are normal high. She also admits that she will go without bolusing for an entire day some days. She wants to get a CGM but just found out that her dad lost his job. She is not sure what the family is planning to do about insurance and being able to afford supplies at this time.   Insulin regimen: Medtronic Insulin pump Basal Rates 12AM 0.825   4am  0.775   630am  1.45   10am  1.90   9pm  1.50    Insulin to Carbohydrate Ratio 12AM  10  6am  8  11am 10  5pm 10       Insulin Sensitivity Factor 12AM  55   6am 37  10pm 55         Target Blood Glucose 12AM  150  6am 110  11pm 150          Hypoglycemia: Able to feel low blood sugars.  No glucagon needed recently.  Insulin Pump download:   - Avg Bg 318. Checking 1.2 x per day   - Using 40 units per day. 88% basal and 12% bolus   - Entering 41 grams of carbs per day.  Freestyle Libre CGM: Not wearing.  Med-alert ID: Not currently wearing. Injection  sites: bilateral hips  Annual labs due: 04/2019 Ophthalmology due: 2018 December. Discussed importance of dilated eye exam.     3. ROS: Greater than 10 systems reviewed with pertinent positives listed in HPI, otherwise neg. Constitutional: She reports good energy and appetite.  Eyes: No changes in vision. Denies blurry vision. Wears glasses.  Ears/Nose/Mouth/Throat: No difficulty swallowing. No throat pain  Cardiovascular: No palpitations. No tachycardia. No chest pain Respiratory: No increased work of breathing.  Gastrointestinal: No constipation or diarrhea.  Genitourinary: No nocturia, no polyuria Neurologic: Normal sensation, no tremor Endocrine: No polydipsia.  No hyperpigmentation Psychiatric: Normal affect. Denies anxiety and depression.   Past Medical History:   Past Medical History:  Diagnosis Date  . Allergy    seasonal allergies  . Goiter   . Hypoglycemia associated with diabetes (HCC)   . Physical growth delay   . Type 1 diabetes mellitus in patient age 22-19 years with HbA1C goal below 7.5 diagnosed age 89  . VSD (ventricular septal defect)     Medications:  Outpatient Encounter Medications as of 07/04/2018  Medication Sig Note  . Calcium Carb-Cholecalciferol (CALCIUM-VITAMIN D) 500-200 MG-UNIT tablet Take 1 tablet by mouth daily. 04/28/2017: Unsure of dose. Target brand.  Takes one  daily  . escitalopram (LEXAPRO) 10 MG tablet Take 10 mg by mouth daily.   Marland Kitchen glucagon 1 MG injection Use for Severe Hypoglycemia . Inject 1mg  intramuscularly 05/17/2018: Has if needed  . glucose blood (BAYER CONTOUR NEXT TEST) test strip CHECK BLOOD SURGAR 10 TIMES DAILY   . insulin glulisine (APIDRA) 100 UNIT/ML injection USE 300 UNITS IN INSULIN  PUMP EVERY 48 HOURS   . loratadine (CLARITIN) 10 MG tablet Take 10 mg by mouth daily.   . Multiple Vitamin (MULTIVITAMIN) tablet Take 1 tablet by mouth daily.     Kathrynn Running Estrad-Fe Biphas (LO LOESTRIN FE PO) Take by mouth.   .  butalbital-acetaminophen-caffeine (FIORICET, ESGIC) 50-325-40 MG tablet Take 1 tablet by mouth every 6 (six) hours as needed for headache. Reported on 04/22/2016 (Patient not taking: Reported on 07/04/2018)   . Continuous Blood Gluc Sensor (FREESTYLE LIBRE SENSOR SYSTEM) MISC Use a sensor every 10 days NDC [id number] (Patient not taking: Reported on 05/18/2018) 05/01/2018: Had problems with skin sensitivity  . Insulin Glargine (BASAGLAR KWIKPEN) 100 UNIT/ML SOPN Use up to 50 units daily. (Patient not taking: Reported on 07/04/2018) 05/18/2018: Has in case of pump failure  . Insulin Pen Needle 31G X 5 MM MISC Check Blood glucose 10x day (Patient not taking: Reported on 07/04/2018)   . ondansetron (ZOFRAN-ODT) 4 MG disintegrating tablet Take 1 tablet (4 mg total) by mouth every 8 (eight) hours as needed for nausea or vomiting. (Patient not taking: Reported on 07/04/2018)    No facility-administered encounter medications on file as of 07/04/2018.     Allergies: No Known Allergies  Surgical History: Past Surgical History:  Procedure Laterality Date  . FRENULECTOMY, LINGUAL    . periodontal surgery      Family History:  Family History  Problem Relation Age of Onset  . Allergies Mother   . Thyroid disease Mother   . Thyroid disease Maternal Grandmother   . Parkinson's disease Maternal Grandmother   . Arthritis Maternal Grandfather   . Arthritis Paternal Grandmother   . Heart attack Paternal Grandfather   . Cancer Neg Hx       Social History: Lives with: Currently living with Parents.  [employer] college for [employer].   Physical Exam:  Vitals:   07/04/18 1047  BP: 108/68  Pulse: 86  Weight: 119 lb 9.6 oz (54.3 kg)  Height: 5' 4.45" (1.637 m)   BP 108/68   Pulse 86   Ht 5' 4.45" (1.637 m)   Wt 119 lb 9.6 oz (54.3 kg)   BMI 20.24 kg/m  Body mass index: body mass index is 20.24 kg/m. Growth percentile SmartLinks can only be used for patients less than 24 years old.  Ht  Readings from Last 3 Encounters:  07/04/18 5' 4.45" (1.637 m)  05/18/18 5' 4.25" (1.632 m)  05/17/18 5\' 4"  (1.626 m)   Wt Readings from Last 3 Encounters:  07/04/18 119 lb 9.6 oz (54.3 kg)  05/18/18 119 lb 12.8 oz (54.3 kg)  05/17/18 121 lb (54.9 kg)    Physical Exam   General: Well developed, well nourished female in no acute distress.  She is alert and oriented.  Head: Normocephalic, atraumatic.   Eyes:  Pupils equal and round. EOMI.   Sclera white.  No eye drainage.  + glasses  Ears/Nose/Mouth/Throat: Nares patent, no nasal drainage.  Normal dentition, mucous membranes moist.   Neck: supple, no cervical lymphadenopathy, no thyromegaly Cardiovascular: regular rate, normal S1/S2, no murmurs Respiratory: No increased work  of breathing.  Lungs clear to auscultation bilaterally.  No wheezes. Abdomen: soft, nontender, nondistended. Normal bowel sounds.  No appreciable masses  Extremities: warm, well perfused, cap refill < 2 sec.   Musculoskeletal: Normal muscle mass.  Normal strength Skin: warm, dry.  No rash or lesions. + insulin pump site  Neurologic: alert and oriented, normal speech, no tremor    Labs:  Results for orders placed or performed in visit on 07/04/18  POCT Glucose (Device for Home Use)  Result Value Ref Range   Glucose Fasting, POC     POC Glucose 191 (A) 70 - 99 mg/dl    Assessment/Plan: Paula Miller is a 22 y.o. female with type 1 diabetes in poor control on insulin pump therapy. She continues to neglect her diabetes care which is causing frequent hyperglycemia. She has multiple days over the past two weeks with no insulin boluses or blood sugar checks. She would greatly benefit from CGM therapy.   1-3. DM w/o complication type I, uncontrolled (HCC)/Hyperglycemia/ Elevated A1c - Medtronic 530g insulin pump  - Advised to bolus for all carbs.  - Reviewed carb counting. Discussed setting bolus reminders  - Check bg at least 4 x per day   - Gave information on CGM  therapy  - Advised to wear medical alert ID at all times.  - POCT glucose  - Reviewed growth chart.   4. Insulin pump in place  - Insulin pump site to hip.  - No changes to pump settings.   5. Maladaptive health behaviors affecting medical condition - Discussed barriers to care  - Encouraged to work on small changes such as at least 2 checks and boluses per day  - Discussed importance of good diabetes care to prevent diabetes related complications.  - Answered questions.    Follow-up:  2 months    I have spent >40 minutes with >50% of time in counseling, education and instruction. When a patient is on insulin, intensive monitoring of blood glucose levels is necessary to avoid hyperglycemia and hypoglycemia. Severe hyperglycemia/hypoglycemia can lead to hospital admissions and be life threatening.    Gretchen ShortSpenser Marqueta Pulley,  FNP-C  Pediatric Specialist  [address]  [place], [id number]  Tele: [phone]

## 2018-07-04 NOTE — Patient Instructions (Addendum)
Goals   - 2 checks per day   - 2 bolus per day   - No days with 0 boluses and checks   - time to start adulting   - follow up in 2 months.

## 2018-07-17 ENCOUNTER — Encounter (INDEPENDENT_AMBULATORY_CARE_PROVIDER_SITE_OTHER): Payer: Self-pay

## 2018-07-17 NOTE — Progress Notes (Signed)
Returned fax to [employer] [facility] for MiniMed 530G pump supplies. Confirmation received.

## 2018-07-18 DIAGNOSIS — E1069 Type 1 diabetes mellitus with other specified complication: Secondary | ICD-10-CM | POA: Diagnosis not present

## 2018-07-18 DIAGNOSIS — E1065 Type 1 diabetes mellitus with hyperglycemia: Secondary | ICD-10-CM | POA: Diagnosis not present

## 2018-08-31 ENCOUNTER — Ambulatory Visit (INDEPENDENT_AMBULATORY_CARE_PROVIDER_SITE_OTHER): Payer: 59 | Admitting: Family

## 2018-08-31 ENCOUNTER — Encounter (INDEPENDENT_AMBULATORY_CARE_PROVIDER_SITE_OTHER): Payer: Self-pay | Admitting: Family

## 2018-08-31 VITALS — BP 112/68 | HR 80 | Ht 64.0 in | Wt 123.4 lb

## 2018-08-31 DIAGNOSIS — Z23 Encounter for immunization: Secondary | ICD-10-CM | POA: Diagnosis not present

## 2018-08-31 DIAGNOSIS — IMO0001 Reserved for inherently not codable concepts without codable children: Secondary | ICD-10-CM

## 2018-08-31 DIAGNOSIS — Z9641 Presence of insulin pump (external) (internal): Secondary | ICD-10-CM

## 2018-08-31 DIAGNOSIS — R739 Hyperglycemia, unspecified: Secondary | ICD-10-CM

## 2018-08-31 DIAGNOSIS — R7309 Other abnormal glucose: Secondary | ICD-10-CM

## 2018-08-31 DIAGNOSIS — F54 Psychological and behavioral factors associated with disorders or diseases classified elsewhere: Secondary | ICD-10-CM | POA: Diagnosis not present

## 2018-08-31 DIAGNOSIS — E1065 Type 1 diabetes mellitus with hyperglycemia: Secondary | ICD-10-CM

## 2018-08-31 LAB — POCT GLUCOSE (DEVICE FOR HOME USE): POC Glucose: 152 mg/dl — AB (ref 70–99)

## 2018-08-31 LAB — POCT GLYCOSYLATED HEMOGLOBIN (HGB A1C): HEMOGLOBIN A1C: 9.9 % — AB (ref 4.0–5.6)

## 2018-08-31 NOTE — Progress Notes (Signed)
Pediatric Endocrinology Diabetes Consultation Follow-up Visit  Paula Miller 1996/10/22 [id number]  Chief Complaint: Follow-up type 1 diabetes   [name], MD   HPI: Paula Miller  is a 22 y.o. female presenting for follow-up of type 1 diabetes.  1. Paula Miller was diagnosed with new onset type 1 diabetes mellitus, dehydration, and ketonuria on 06/09/2005. She was initially treated with a multiple daily injection regimen of Lantus and Novolog insulins.  We converted her to a Medtronic insulin pump in November of 2007.  Although the patient does have a palpable goiter, she has remained euthyroid. Her CMPs, lipid panels, and urinary microalbumin to creatinine ratios have always been normal.   2. Since last visit to [facility] on 06/2018, she has been well.  No ER visits or hospitalizations.   She started school at [employer] for her master's degree. She is working as a Social worker 3 days per week. Since moving to [facility] she has made improvements to her diabetes care. She states that she is checking more often and will give correction boluses at least once per day. She knows that she is not bolusing for carb intake and feels like it is because she is to distracted to calculate carbs. She is having issues with her Medtronic 530g insulin pump, it is burning through batteries much faster then expected. She would like to discuss new pump options today.   Insulin regimen: Medtronic Insulin pump Basal Rates 12AM 0.80   4am  0.775   630am  1.45   10am  1.90   9pm  1.50    Insulin to Carbohydrate Ratio 12AM  10  6am  8  11am 10  5pm 10       Insulin Sensitivity Factor 12AM  55   6am 37  10pm 55         Target Blood Glucose 12AM  150  6am 110  11pm 150          Hypoglycemia: Able to feel low blood sugars.  No glucagon needed recently.  Insulin Pump download:   - Avg Bg 312. Checking 2 x per day   - Target Range: In target 11%, above target 87% and below target 2%   - Entering 41  grams of carbs per day   - Using 40 units per day on average. 87% basa land 13% bolus.   Freestyle Libre CGM: Not wearing.  Med-alert ID: Not currently wearing. Injection sites: bilateral hips  Annual labs due: 04/2019 Ophthalmology due: 2018 December. Discussed importance of dilated eye exam.     3. ROS: Greater than 10 systems reviewed with pertinent positives listed in HPI, otherwise neg. Constitutional: Good energy and appetite. Sleeping well.  Eyes: No changes in vision. Denies blurry vision. Wears glasses.  Ears/Nose/Mouth/Throat: No difficulty swallowing. No throat pain  Cardiovascular: No palpitations. No tachycardia. No chest pain Respiratory: No increased work of breathing.  Gastrointestinal: No constipation or diarrhea.  Genitourinary: No nocturia, no polyuria Neurologic: Normal sensation, no tremor Endocrine: No polydipsia.  No hyperpigmentation Psychiatric: Normal affect. Denies anxiety and depression.   Past Medical History:   Past Medical History:  Diagnosis Date  . Allergy    seasonal allergies  . Goiter   . Hypoglycemia associated with diabetes (HCC)   . Physical growth delay   . Type 1 diabetes mellitus in patient age 41-19 years with HbA1C goal below 7.5 diagnosed age 16  . VSD (ventricular septal defect)     Medications:  Outpatient Encounter Medications as of 08/31/2018  Medication Sig Note  . escitalopram (LEXAPRO) 10 MG tablet Take 10 mg by mouth daily.   Marland Kitchen glucagon 1 MG injection Use for Severe Hypoglycemia . Inject 1mg  intramuscularly 05/17/2018: Has if needed  . glucose blood (BAYER CONTOUR NEXT TEST) test strip CHECK BLOOD SURGAR 10 TIMES DAILY   . insulin glulisine (APIDRA) 100 UNIT/ML injection USE 300 UNITS IN INSULIN  PUMP EVERY 48 HOURS   . loratadine (CLARITIN) 10 MG tablet Take 10 mg by mouth daily.   . Multiple Vitamin (MULTIVITAMIN) tablet Take 1 tablet by mouth daily.     Paula Running Estrad-Fe Biphas (LO LOESTRIN FE PO) Take by mouth.    . ondansetron (ZOFRAN-ODT) 4 MG disintegrating tablet Take 1 tablet (4 mg total) by mouth every 8 (eight) hours as needed for nausea or vomiting.   . butalbital-acetaminophen-caffeine (FIORICET, ESGIC) 50-325-40 MG tablet Take 1 tablet by mouth every 6 (six) hours as needed for headache. Reported on 04/22/2016 (Patient not taking: Reported on 07/04/2018)   . Calcium Carb-Cholecalciferol (CALCIUM-VITAMIN D) 500-200 MG-UNIT tablet Take 1 tablet by mouth daily. 04/28/2017: Unsure of dose. Target brand.  Takes one daily  . Continuous Blood Gluc Sensor (FREESTYLE LIBRE SENSOR SYSTEM) MISC Use a sensor every 10 days NDC [phone] (Patient not taking: Reported on 05/18/2018) 05/01/2018: Had problems with skin sensitivity  . Insulin Glargine (BASAGLAR KWIKPEN) 100 UNIT/ML SOPN Use up to 50 units daily. (Patient not taking: Reported on 07/04/2018) 05/18/2018: Has in case of pump failure  . Insulin Pen Needle 31G X 5 MM MISC Check Blood glucose 10x day (Patient not taking: Reported on 07/04/2018)    No facility-administered encounter medications on file as of 08/31/2018.     Allergies: No Known Allergies  Surgical History: Past Surgical History:  Procedure Laterality Date  . FRENULECTOMY, LINGUAL    . periodontal surgery      Family History:  Family History  Problem Relation Age of Onset  . Allergies Mother   . Thyroid disease Mother   . Thyroid disease Maternal Grandmother   . Parkinson's disease Maternal Grandmother   . Arthritis Maternal Grandfather   . Arthritis Paternal Grandmother   . Heart attack Paternal Grandfather   . Cancer Neg Hx       Social History: Lives with: Currently living with Parents.  [employer] college for [employer].   Physical Exam:  Vitals:   08/31/18 1038  BP: 112/68  Pulse: 80  Weight: 123 lb 6.4 oz (56 kg)  Height: 5\' 4"  (1.626 m)   BP 112/68   Pulse 80   Ht 5\' 4"  (1.626 m)   Wt 123 lb 6.4 oz (56 kg)   BMI 21.18 kg/m  Body mass index: body mass  index is 21.18 kg/m. Growth percentile SmartLinks can only be used for patients less than 25 years old.  Ht Readings from Last 3 Encounters:  08/31/18 5\' 4"  (1.626 m)  07/04/18 5' 4.45" (1.637 m)  05/18/18 5' 4.25" (1.632 m)   Wt Readings from Last 3 Encounters:  08/31/18 123 lb 6.4 oz (56 kg)  07/04/18 119 lb 9.6 oz (54.3 kg)  05/18/18 119 lb 12.8 oz (54.3 kg)    Physical Exam   General: Well developed, well nourished female in no acute distress.  She is alert and oriented.  Head: Normocephalic, atraumatic.   Eyes:  Pupils equal and round. EOMI.   Sclera white.  No eye drainage.   Ears/Nose/Mouth/Throat: Nares patent, no nasal drainage.  Normal dentition, mucous membranes  moist.   Neck: supple, no cervical lymphadenopathy, no thyromegaly Cardiovascular: regular rate, normal S1/S2, no murmurs Respiratory: No increased work of breathing.  Lungs clear to auscultation bilaterally.  No wheezes. Abdomen: soft, nontender, nondistended. Normal bowel sounds.  No appreciable masses  Extremities: warm, well perfused, cap refill < 2 sec.   Musculoskeletal: Normal muscle mass.  Normal strength Skin: warm, dry.  No rash or lesions. + pump site to hips.  Neurologic: alert and oriented, normal speech, no tremor     Labs:  Results for orders placed or performed in visit on 08/31/18  POCT Glucose (Device for Home Use)  Result Value Ref Range   Glucose Fasting, POC     POC Glucose 152 (A) 70 - 99 mg/dl  POCT glycosylated hemoglobin (Hb A1C)  Result Value Ref Range   Hemoglobin A1C 9.9 (A) 4.0 - 5.6 %   HbA1c POC (<> result, manual entry)     HbA1c, POC (prediabetic range)     HbA1c, POC (controlled diabetic range)      Assessment/Plan: Catalaya is a 22 y.o. female with type 1 diabetes diabetes in poor control on insulin pump therapy. She is not bolusing for carbs but is bolusing for correction more often. Her blood sugars are hyperglycemic that majority of the day. Her hemoglobin A1c  is 9.9% which is higher then the ADA goa of <7% but better then 10.7% at her last visit.    1-3. DM w/o complication type I, uncontrolled (HCC)/Hyperglycemia/ Elevated A1c - Medtronic 530 g insulin pump  - Reviewed pump download with patient and discussed patterns.  - Spent time discussing current insulin pumps and CGM's available. Gave handouts.  - Advised to bolus 6 units, 3 times per day. This will cover her "estimated" carbs at meals  - Rotate pump sites to prevent scar tissue.  - POCT glucose  - POCt hemoglobin A1c  - Reviewed growth chart.   4. Insulin pump in place  - Insulin pump site to hip.  - No changes to pump settings.   5. Maladaptive health behaviors affecting medical condition - Discussed barriers to care.  - Discussed balancing diabetes care with school, work and social life.  - Answered questions.   6. Influenza Vaccine.  - Given. Counseling provided.   Follow-up:  2 months    I have spent >25 minutes with >50% of time in counseling, education and instruction. When a patient is on insulin, intensive monitoring of blood glucose levels is necessary to avoid hyperglycemia and hypoglycemia. Severe hyperglycemia/hypoglycemia can lead to hospital admissions and be life threatening.    Gretchen Short,  FNP-C  Pediatric Specialist  [address]  [place], [address]  Tele: [phone]

## 2018-08-31 NOTE — Patient Instructions (Signed)
-   Bolus 6 units, 3 x per day  - Check blood sugar at least 4 x per day  - A1c is down to 9.9%   - 2 months (December)

## 2018-09-06 DIAGNOSIS — J0100 Acute maxillary sinusitis, unspecified: Secondary | ICD-10-CM | POA: Diagnosis not present

## 2018-10-02 DIAGNOSIS — E1065 Type 1 diabetes mellitus with hyperglycemia: Secondary | ICD-10-CM | POA: Diagnosis not present

## 2018-10-31 ENCOUNTER — Ambulatory Visit (INDEPENDENT_AMBULATORY_CARE_PROVIDER_SITE_OTHER): Payer: 59 | Admitting: Family

## 2018-11-01 ENCOUNTER — Telehealth (INDEPENDENT_AMBULATORY_CARE_PROVIDER_SITE_OTHER): Payer: Self-pay | Admitting: Family

## 2018-11-01 NOTE — Telephone Encounter (Signed)
°  Who's calling (name and relationship to patient) : Misty StanleyLisa (Mother)  Best contact number: [phone] Provider they see: Ovidio KinSpenser Reason for call: Mom would like a return call from clinic. She has questions about pt's pump.

## 2018-11-01 NOTE — Telephone Encounter (Signed)
appt made for pump demo.

## 2018-11-01 NOTE — Telephone Encounter (Signed)
LVM to try and find out what is the pump issue. Please call and let us know.  FYI

## 2018-11-10 ENCOUNTER — Other Ambulatory Visit (INDEPENDENT_AMBULATORY_CARE_PROVIDER_SITE_OTHER): Payer: 59 | Admitting: *Deleted

## 2018-11-13 DIAGNOSIS — E109 Type 1 diabetes mellitus without complications: Secondary | ICD-10-CM | POA: Diagnosis not present

## 2018-11-30 ENCOUNTER — Ambulatory Visit (INDEPENDENT_AMBULATORY_CARE_PROVIDER_SITE_OTHER): Payer: BLUE CROSS/BLUE SHIELD | Admitting: Family

## 2018-11-30 ENCOUNTER — Encounter (INDEPENDENT_AMBULATORY_CARE_PROVIDER_SITE_OTHER): Payer: Self-pay | Admitting: Family

## 2018-11-30 VITALS — BP 114/64 | HR 84 | Ht 64.0 in | Wt 122.6 lb

## 2018-11-30 DIAGNOSIS — R739 Hyperglycemia, unspecified: Secondary | ICD-10-CM

## 2018-11-30 DIAGNOSIS — E1065 Type 1 diabetes mellitus with hyperglycemia: Secondary | ICD-10-CM

## 2018-11-30 DIAGNOSIS — R7309 Other abnormal glucose: Secondary | ICD-10-CM

## 2018-11-30 DIAGNOSIS — IMO0001 Reserved for inherently not codable concepts without codable children: Secondary | ICD-10-CM

## 2018-11-30 DIAGNOSIS — F54 Psychological and behavioral factors associated with disorders or diseases classified elsewhere: Secondary | ICD-10-CM

## 2018-11-30 DIAGNOSIS — Z4681 Encounter for fitting and adjustment of insulin pump: Secondary | ICD-10-CM

## 2018-11-30 LAB — POCT GLUCOSE (DEVICE FOR HOME USE): POC Glucose: 286 mg/dl — AB (ref 70–99)

## 2018-11-30 LAB — POCT GLYCOSYLATED HEMOGLOBIN (HGB A1C): Hemoglobin A1C: 10.1 % — AB (ref 4.0–5.6)

## 2018-11-30 NOTE — Progress Notes (Signed)
Pediatric Endocrinology Diabetes Consultation Follow-up Visit  Paula Miller 08-21-1996 [id number]  Chief Complaint: Follow-up type 1 diabetes   Paula Miller, Eve, MD   HPI: Paula Miller  is a 23 y.o. female presenting for follow-up of type 1 diabetes.  1. Paula Miller was diagnosed with new onset type 1 diabetes mellitus, dehydration, and ketonuria on 06/09/2005. She was initially treated with a multiple daily injection regimen of Lantus and Novolog insulins.  We converted her to a Medtronic insulin pump in November of 2007.  Although the patient does have a palpable goiter, she has remained euthyroid. Her CMPs, lipid panels, and urinary microalbumin to creatinine ratios have always been normal.   2. Since last visit to [facility] on 08/2018, she has been well.  No ER visits or hospitalizations.   She had a good holiday break but has started back at school. Getting Master's degree at [employer]. Using medtronic 530g insulin pump and reports it is working ok right now but she wants to get a new pump soon. Has not considered using CGM yet. She just found out that her pump is out of warranty so she is going to get a new one.   She is working 20 hours per week as well but feels like things have been pretty good. She has not done very well with blood sugar check and bolusing. She is very annoyed by the bolus alert reminders on her pump. She reports having hypoglycemia overnight and she reduced her own basal rate. She would like to reduce her basal further to see if it will encourage her to bolus more. Otherwise, no concerns.     Insulin regimen: Medtronic Insulin pump Basal Rates 12AM 0.70   4am  0.70   630am  1.35   10am  1.90   9pm  1.40    Insulin to Carbohydrate Ratio 12AM  10  6am  8  11am 10  5pm 10       Insulin Sensitivity Factor 12AM  55   6am 37  10pm 55         Target Blood Glucose 12AM  150  6am 110  11pm 150          Hypoglycemia: Able to feel low blood sugars.  No  glucagon needed recently.  Insulin Pump download:   - Avg Bg 237  - Checking 1.1 x per day. 5 days with 0 checks.   - Target Range; IN target 38%, above target 62% and below target 0%.  Freestyle Libre CGM: Not wearing.  Med-alert ID: Not currently wearing. Injection sites: bilateral hips  Annual labs due: 04/2019 Ophthalmology due: 2019 December. Discussed importance of dilated eye exam.     3. ROS: Greater than 10 systems reviewed with pertinent positives listed in HPI, otherwise neg. Constitutional: She has good energy and appetite. Weight is stable.  Eyes: No changes in vision. Denies blurry vision. Wears glasses.  Ears/Nose/Mouth/Throat: No difficulty swallowing. No throat pain  Cardiovascular: No palpitations. No tachycardia. No chest pain Respiratory: No increased work of breathing.  Gastrointestinal: No constipation or diarrhea.  Genitourinary: No nocturia, no polyuria Neurologic: Normal sensation, no tremor Endocrine: No polydipsia.  No hyperpigmentation Psychiatric: Normal affect. Denies anxiety and depression.   Past Medical History:   Past Medical History:  Diagnosis Date  . Allergy    seasonal allergies  . Goiter   . Hypoglycemia associated with diabetes (HCC)   . Physical growth delay   . Type 1 diabetes mellitus in patient age 23-19  years with HbA1C goal below 7.5 diagnosed age 38  . VSD (ventricular septal defect)     Medications:  Outpatient Encounter Medications as of 11/30/2018  Medication Sig Note  . Calcium Carb-Cholecalciferol (CALCIUM-VITAMIN D) 500-200 MG-UNIT tablet Take 1 tablet by mouth daily. 04/28/2017: Unsure of dose. Target brand.  Takes one daily  . escitalopram (LEXAPRO) 10 MG tablet Take 10 mg by mouth daily.   Marland Kitchen glucagon 1 MG injection Use for Severe Hypoglycemia . Inject 1mg  intramuscularly 05/17/2018: Has if needed  . glucose blood (BAYER CONTOUR NEXT TEST) test strip CHECK BLOOD SURGAR 10 TIMES DAILY   . HUMALOG 100 UNIT/ML injection USE 300  UNITS IN INSULIN PUMP EVERY 48 HOURS. 11/30/2018: Keeps in case of pump failure  . Insulin Glargine (BASAGLAR KWIKPEN) 100 UNIT/ML SOPN Use up to 50 units daily. 05/18/2018: Has in case of pump failure  . insulin glulisine (APIDRA) 100 UNIT/ML injection USE 300 UNITS IN INSULIN  PUMP EVERY 48 HOURS   . Insulin Pen Needle 31G X 5 MM MISC Check Blood glucose 10x day   . loratadine (CLARITIN) 10 MG tablet Take 10 mg by mouth daily.   . Multiple Vitamin (MULTIVITAMIN) tablet Take 1 tablet by mouth daily.     Kathrynn Running Estrad-Fe Biphas (LO LOESTRIN FE PO) Take by mouth.   . ondansetron (ZOFRAN-ODT) 4 MG disintegrating tablet Take 1 tablet (4 mg total) by mouth every 8 (eight) hours as needed for nausea or vomiting.   . butalbital-acetaminophen-caffeine (FIORICET, ESGIC) 50-325-40 MG tablet Take 1 tablet by mouth every 6 (six) hours as needed for headache. Reported on 04/22/2016 (Patient not taking: Reported on 07/04/2018) 11/30/2018: PRN  . Continuous Blood Gluc Sensor (FREESTYLE LIBRE SENSOR SYSTEM) MISC Use a sensor every 10 days NDC [phone] (Patient not taking: Reported on 05/18/2018) 05/01/2018: Had problems with skin sensitivity   No facility-administered encounter medications on file as of 11/30/2018.     Allergies: No Known Allergies  Surgical History: Past Surgical History:  Procedure Laterality Date  . FRENULECTOMY, LINGUAL    . periodontal surgery      Family History:  Family History  Problem Relation Age of Onset  . Allergies Mother   . Thyroid disease Mother   . Thyroid disease Maternal Grandmother   . Parkinson's disease Maternal Grandmother   . Arthritis Maternal Grandfather   . Arthritis Paternal Grandmother   . Heart attack Paternal Grandfather   . Cancer Neg Hx       Social History: Lives with: Currently living with Parents.  [employer] college for [employer].   Physical Exam:  Vitals:   11/30/18 1005  BP: 114/64  Pulse: 84  Weight: 122 lb 9.6 oz (55.6 kg)   Height: 5\' 4"  (1.626 m)   BP 114/64   Pulse 84   Ht 5\' 4"  (1.626 m)   Wt 122 lb 9.6 oz (55.6 kg)   LMP 11/05/2018 (Approximate)   BMI 21.04 kg/m  Body mass index: body mass index is 21.04 kg/m. Growth percentile SmartLinks can only be used for patients less than 62 years old.  Ht Readings from Last 3 Encounters:  11/30/18 5\' 4"  (1.626 m)  08/31/18 5\' 4"  (1.626 m)  07/04/18 5' 4.45" (1.637 m)   Wt Readings from Last 3 Encounters:  11/30/18 122 lb 9.6 oz (55.6 kg)  08/31/18 123 lb 6.4 oz (56 kg)  07/04/18 119 lb 9.6 oz (54.3 kg)    Physical Exam   General: Well developed, well nourished female  in no acute distress.  Alert and oriented.  Head: Normocephalic, atraumatic.   Eyes:  Pupils equal and round. EOMI.   Sclera white.  No eye drainage.   Ears/Nose/Mouth/Throat: Nares patent, no nasal drainage.  Normal dentition, mucous membranes moist.   Neck: supple, no cervical lymphadenopathy, no thyromegaly Cardiovascular: regular rate, normal S1/S2, no murmurs Respiratory: No increased work of breathing.  Lungs clear to auscultation bilaterally.  No wheezes. Abdomen: soft, nontender, nondistended. Normal bowel sounds.  No appreciable masses  Extremities: warm, well perfused, cap refill < 2 sec.   Musculoskeletal: Normal muscle mass.  Normal strength Skin: warm, dry.  No rash or lesions. + pump site to hip.  Neurologic: alert and oriented, normal speech, no tremor     Labs:  Results for orders placed or performed in visit on 11/30/18  POCT Glucose (Device for Home Use)  Result Value Ref Range   Glucose Fasting, POC     POC Glucose 286 (A) 70 - 99 mg/dl  POCT glycosylated hemoglobin (Hb A1C)  Result Value Ref Range   Hemoglobin A1C 10.1 (A) 4.0 - 5.6 %   HbA1c POC (<> result, manual entry)     HbA1c, POC (prediabetic range)     HbA1c, POC (controlled diabetic range)      Assessment/Plan: Laquette is a 23 y.o. female with type 1 diabetes in poor control on Insulin pump  therapy. She continues to struggle with diabetes care. Not checking blood sugar frequently enough and rarely bolusing. She would benefit from a closed loop insulin pump and especially from CGm therapy. Her hemoglobin A1c is 10.1% which is higher then the ADA goal of <7% for adults.   1-3. DM w/o complication type I, uncontrolled (HCC)/Hyperglycemia/ Elevated A1c - Reviewed pump and glucose download. Discussed patterns and trends.  - Encouraged to use temp basal   - Increase during stress and sickness   - Decrease during exercise  - Give bolus 15 minutes before eating to limit blood sugar spikes.  - Rotate injection sites.  - Wear medical alert ID at all times.  - POCT glucose and hemoglobin A1c  - Reviewed growth chart.  - Discussed available insulin pumps. Gave information packets on all three.    4. Insulin pump titration  - Basal Rates 12AM 0.70--> 0.65   4am  0.70--> 0.65   630am  1.35--> 1.25   10am  1.90--> 1. 80    9pm  1.40--> 1.30     5. Maladaptive health behaviors affecting medical condition - Discussed barriers to care.   -Stressed the importance of good diabetes care to prevent diabetes related complications.  - Discussed balancing diabetes care with school, work and activity.     Follow-up:  3 months.    I have spent >25 minutes with >50% of time in counseling, education and instruction. When a patient is on insulin, intensive monitoring of blood glucose levels is necessary to avoid hyperglycemia and hypoglycemia. Severe hyperglycemia/hypoglycemia can lead to hospital admissions and be life threatening.    Gretchen Short,  FNP-C  Pediatric Specialist  [address]  [place], [address]  Tele: [phone]

## 2018-11-30 NOTE — Patient Instructions (Signed)
-  Always have fast sugar with you in case of low blood sugar (glucose tabs, regular juice or soda, candy) -Always wear your ID that states you have diabetes -Always bring your meter to your visit -Call/Email if you want to review blood sugars  - Consider switching to Tslim insulin pump or Omnipod insulin pump  - Dexcom G6 would be extremely helpful.  -

## 2018-12-11 ENCOUNTER — Other Ambulatory Visit (INDEPENDENT_AMBULATORY_CARE_PROVIDER_SITE_OTHER): Payer: Self-pay

## 2018-12-11 MED ORDER — INSULIN ASPART 100 UNIT/ML ~~LOC~~ SOLN: SUBCUTANEOUS | 1 refills | Status: DC

## 2018-12-28 ENCOUNTER — Telehealth (INDEPENDENT_AMBULATORY_CARE_PROVIDER_SITE_OTHER): Payer: Self-pay | Admitting: Family

## 2018-12-28 NOTE — Telephone Encounter (Signed)
Spoke with mom and she states Maie would like to switch to the Tandem T slim. Informed mom that the tandem works with the Dexcom G6 in a closed loop control IQ and we could send for her to start this as well. Mom asks that we send an AOB for both of these before they go forward with purchasing.

## 2018-12-28 NOTE — Telephone Encounter (Signed)
°  Who's calling (name and relationship to patient) : Hamdi Defrancis - Mom   Best contact number: [phone]  Provider they see: Gretchen Short   Reason for call: Mom called in stating that Kemonie's insulin pump is out of warranty and they ae interested in ordering a new one. Mom said please to call after 12PM. Please advise     PRESCRIPTION REFILL ONLY  Name of prescription:  Pharmacy:

## 2019-01-10 ENCOUNTER — Other Ambulatory Visit (INDEPENDENT_AMBULATORY_CARE_PROVIDER_SITE_OTHER): Payer: Self-pay | Admitting: *Deleted

## 2019-01-10 NOTE — Telephone Encounter (Signed)
Mom called to follow up on pump status. She would like a return call.  [phone] (if mom is unreachable at cell number given above)

## 2019-01-11 ENCOUNTER — Telehealth (INDEPENDENT_AMBULATORY_CARE_PROVIDER_SITE_OTHER): Payer: Self-pay | Admitting: *Deleted

## 2019-01-11 NOTE — Telephone Encounter (Signed)
When I spoke to mom this morning, she advises Trish is not keen on using a sensor, I advised the sensor is what made the Tslim and 670 so good and useful. If all she wants is an insulin delivery the omnipod may be a better choice, I suggested they come talk to [facility]. She will talk to [facility] and let us know.

## 2019-01-11 NOTE — Telephone Encounter (Signed)
Spoke to mother, advised paperwork has been sent. We are waiting on insurance.

## 2019-01-24 DIAGNOSIS — E1065 Type 1 diabetes mellitus with hyperglycemia: Secondary | ICD-10-CM | POA: Diagnosis not present

## 2019-01-25 DIAGNOSIS — E1065 Type 1 diabetes mellitus with hyperglycemia: Secondary | ICD-10-CM | POA: Diagnosis not present

## 2019-02-21 ENCOUNTER — Ambulatory Visit (INDEPENDENT_AMBULATORY_CARE_PROVIDER_SITE_OTHER): Payer: BLUE CROSS/BLUE SHIELD | Admitting: *Deleted

## 2019-02-21 ENCOUNTER — Other Ambulatory Visit: Payer: Self-pay

## 2019-02-21 VITALS — BP 104/62 | Ht 64.0 in | Wt 124.4 lb

## 2019-02-21 DIAGNOSIS — IMO0001 Reserved for inherently not codable concepts without codable children: Secondary | ICD-10-CM

## 2019-02-21 DIAGNOSIS — E1065 Type 1 diabetes mellitus with hyperglycemia: Secondary | ICD-10-CM

## 2019-02-21 LAB — POCT GLUCOSE (DEVICE FOR HOME USE): Glucose Fasting, POC: 160 mg/dL — AB (ref 70–99)

## 2019-02-22 NOTE — Progress Notes (Signed)
Transfer insulin pump settings to new Tandem Control IQ insulin pump  Paula Miller was here with her mother Paula Miller to transfer insulin pump settings from her old Medtronic 530G insulin pump to the new Tandem Control IQ and start on her Dexcom G6. She is excited to start on the CGM and get off finger pricks.   We started with Dexcom CGM Review indications for use, contraindications, warnings and precautions of Dexcom CGM.  Please remove the Dexcom CGM sensor before any X-ray or CT scan or MRI procedures.    Demonstrated and showed patient to enter blood glucose readings and adjusting the lows and the high alerts on Dexcom app on smart phone and insulin pump.  Customize the Dexcom software features and settings based on the provider and patient's needs.    Sensor settings: High Alert                    On       275 mg/dL High repeat                 On       3 hours Rise rate                      Off   Low Alert                     On       80 mg/dL Low Repeat                 On       15 mins Fall Rate                      On   Urgent Low soon         On       20 mins Urgent Low                  On       55 mg/dL   Signal loss                   On       20 mins No readings                 On       20 mins   Showed and demonstrated patient and parent how to apply a demo Dexcom CGM sensor,  Patient verbalized understanding the steps then proceeded to apply the sensor on.  Patient chose Left Upper Arm, cleaned the area using alcohol,  Then applied adhesive in a circular motion,  Applied applicator and inserted the sensor.  Patient tolerated very well the procedure,  Patient started CGM on phone app, was able to pair transmitter and sensor to her phone and insulin pump.  The patient should be within 20 feet of the receiver so the transmitter can communicate to the phone app.  Showed and demonstrated parent and patient how to calibrate CGM on receiver and phone app.  Insulin pump   Pump  overview: Touch screen and general navigation -Screen On (wake) Quick bolus button -Screen lock- turns off pump screen after each interaction -Touch screen-turns off after 3 accidental screen taps -Home screen and home "T" button -Status, bolus and Options button -My pump screen -Keypad screens numbers and letters -Importance of Active confirmation screens -Icons and symbols on touchscreen   Personal Profiles  -  Creating a new Personal Profile (Basal rates, insulin sensitivity, IC ratio and BG target) - Edit and review, Active, Duplicate, Delete and renaming a Personal Profile  Alert Settings: -Reminders: Low BG, High BG, after Bolus BG, missed bolus -Alerts: Low insulin, auto off  Pump Settings -Quick Bolus: grams or units increments -Pump Volume: Low, Med, High, or vibrate -Screen Options: Screen Timeout, feature lock  -time and date (importance for accuracy of settings and data)  Pump Info  SN- [id number]  Insulin pump settings:  Time Basal Rate Correction factor Carb ratio Target BG   12a 0.65 55 mg/dL 10 150 mg/dL  6:30a 1.25 37 mg/dL 8 110 mg/dL  10a 1.80 37 mg/dL 8 110 mg/dL  11a 1.80 37 mg/dL 10 110 mg/dL  9 p 1.35 37 mg/dL 10 110 mg/dL  10p 1.35 55 mg d/L 10 110 mg d/L  11p 1.35 55 mg d/L 10 150 mg/dL   Total Basal 32.45 Units      Duration of insulin   3 hours     Maximum Bolus 20 Units     Carb (for calculation) On      Low Insulin Alert On- 20 Units      Auto Off Off     Quick Bolus Off     Basal IQ On        Loading cartridge -Change cartridge: avoid changing at bedtime, use room temperature insulin, fill syringe, and remove bubbles prior to filling cartridge. -Fill Cartridge (min 95 units and max 300 units) remove air, check for leaks, and connect to infusion set -fill Tubing (Ensure disconnect from site. Check for leaks) - Fill Cannula -Site reminder -fill estimate volume - Do not add or remove insulin after the Load sequence -removal and disposal of  used cartridge and infusion set.   Temporary Basal rates - Pump can be program from 0-250%, 15 mins -72 hours, start and stop a Temp rate  My CGM (if applicable) -Entering transmitter ID, entering sensor code, starting sensor session - 2 hour warm up period - Sensor alerts: high/Lows, rise/fall, end session, set volume - Out of range alerts: must be turned on in order to optimize the safety and performance of Basal IQ feature - CGM graph (change display timeline) and trend arrows  - Optimize connectivity between pump and sensor (pump screen facing out)  Pump/CGM history - Delivery summary, total daily dose, bolus, load BG, alerts and alarms - Session and calibration, alerts and error, complete  Delivering Boluses:  - Standard food bolus, adding multiple carbs, cancelling bolus - 0.05 minimum unit bolus 25 units maximum bolus - Entering a BG value, correction bolus, food bolus with correction - Above/Below Bg target and IOB- Bolus calculator Algorithm - Extended Bolus - On  - Quick Bolus Off  Safety: - Importance of backup plan and supplies to carry at all times: insulin syringe or pen and BG meter (Insulin pump Emergency kit) in case of emergency - Stop and resume insulin delivery - Aseptic/clean technique - Check Bg x daily if not using CGM - Hazard associated with small part and exposure to electromagnetic radiation or MRI - Reminders Low Bg, and High BG retest) site changes or follow DKA protocols - Tandem insulin pump SN warranty info, 24 hour/7 days Technical support [phone]  Control IQ Technology: - Uses CMG values to predict sensor glucose 30 minutes into future suspends, increases ( stops) insulin delivery if predicted valued < 70 mg/dL - Suspends (stops) insulin delivery if actual   sensor glucose value is <70 mg/dL - Basal rate will automatically resume when CGM values begin to rise - Maximum Time of insulin suspension is 2 hours out of any 2.5 hr period - Basal  insulin is either delivering or suspended not adjusted - Basal IQ feature does not replace active diabetes management; treatment of hypoglycemia may need to be adjusted. Discuss changes with provider.   Patient verbalized readiness to start insulin pump.  Followed instructions in insulin pump how to change a cartridge. Filled Cartridge with 250 units, after removing air from cartridge.  Filled tubing and attached cartridge to insulin pump.  Parent inserted cannula on Left Upper Leg, patient tolerated very well. Checked Blood sugar, which required a correction, patient confirmed correction.   Assessment/ Plan: Patient and parent participated with hands on training and asked appropriate questions.  Patient was able to transfer her insulin pump settings to the new Tandem Control IQ insulin pump with no problems.  Patient tolerated very well the cannula and sensor insertions with no problems. Please call us if you have any questions or concerns regarding your diabetes.        If any technical questions regarding your insulin pump and or CGM, please call the company Tandem and or Dexcom.        TConnect: [email or web address]  

## 2019-02-26 ENCOUNTER — Encounter (INDEPENDENT_AMBULATORY_CARE_PROVIDER_SITE_OTHER): Payer: Self-pay | Admitting: *Deleted

## 2019-02-28 DIAGNOSIS — F419 Anxiety disorder, unspecified: Secondary | ICD-10-CM | POA: Diagnosis not present

## 2019-03-01 ENCOUNTER — Encounter (INDEPENDENT_AMBULATORY_CARE_PROVIDER_SITE_OTHER): Payer: Self-pay | Admitting: Family

## 2019-03-01 ENCOUNTER — Ambulatory Visit (INDEPENDENT_AMBULATORY_CARE_PROVIDER_SITE_OTHER): Payer: BLUE CROSS/BLUE SHIELD | Admitting: Family

## 2019-03-01 ENCOUNTER — Other Ambulatory Visit: Payer: Self-pay

## 2019-03-01 DIAGNOSIS — R7309 Other abnormal glucose: Secondary | ICD-10-CM

## 2019-03-01 DIAGNOSIS — F54 Psychological and behavioral factors associated with disorders or diseases classified elsewhere: Secondary | ICD-10-CM

## 2019-03-01 DIAGNOSIS — IMO0001 Reserved for inherently not codable concepts without codable children: Secondary | ICD-10-CM

## 2019-03-01 DIAGNOSIS — E1065 Type 1 diabetes mellitus with hyperglycemia: Secondary | ICD-10-CM | POA: Diagnosis not present

## 2019-03-01 DIAGNOSIS — Z4681 Encounter for fitting and adjustment of insulin pump: Secondary | ICD-10-CM

## 2019-03-01 DIAGNOSIS — R739 Hyperglycemia, unspecified: Secondary | ICD-10-CM

## 2019-03-01 LAB — POCT GLYCOSYLATED HEMOGLOBIN (HGB A1C): Hemoglobin A1C: 9.8 % — AB (ref 4.0–5.6)

## 2019-03-01 NOTE — Patient Instructions (Signed)
-  Always have fast sugar with you in case of low blood sugar (glucose tabs, regular juice or soda, candy) -Always wear your ID that states you have diabetes -Always bring your meter to your visit -Call/Email if you want to review blood sugars  - Insulin pump changes made.

## 2019-03-01 NOTE — Progress Notes (Signed)
This is a Pediatric Specialist E-Visit follow up consult provided via WebEx Paula Miller consented to an E-Visit consult today.  Location of patient: Paula Miller is at home Location of provider: Gretchen ShortSpenser Pantelis Elgersma, FNP is at [facility] office  Patient was referred by Paula ArrowKnapp, Eve, MD   The following participants were involved in this E-Visit: Paula Miller  Chief Complain/ Reason for E-Visit today: DM type 1   Total time on call: This visit lasted >25 minutes. More then 50% of the visit was devoted to counseling and education.   Follow up: 2 months.   Pediatric Endocrinology Diabetes Consultation Follow-up Visit  Paula Miller May 06, 1996 [id number]  Chief Complaint: Follow-up type 1 diabetes   Paula ArrowKnapp, Eve, MD   HPI: Paula Miller  is a 23 y.o. female presenting for follow-up of type 1 diabetes.  1. Paula Miller was diagnosed with new onset type 1 diabetes mellitus, dehydration, and ketonuria on 06/09/2005. She was initially treated with a multiple daily injection regimen of Lantus and Novolog insulins.  We converted her to a Medtronic insulin pump in November of 2007.  Although the patient does have a palpable goiter, she has remained euthyroid. Her CMPs, lipid panels, and urinary microalbumin to creatinine ratios have always been normal.   2. Since last visit to [facility] on 11/2018 , she has been well.  No ER visits or hospitalizations.   She started Tandem Tslim insulin pump with Dexcom CGm and control IQ two weeks ago. She feels like she is doing much better on it overall. She finds the Dexcom very helpful and it is staying on longer the when she tried the Artondalelibre. She is bolusing more due to alert from the Dexcom. She is very impressed with the improvements she has made.   Concerns:  Low blood sugars occurring overnight. Has not set up sleep schedule yet on pump.  - WHen to apply extra tape to Dexcom to prevent from peeling off.  - DMV paperwork.    Insulin regimen: Tandem Tslim   Basal Rates 12AM 0.65  630Am  1.25  10am 1.80  9pm 1.35  11pm 1.35    Insulin to Carbohydrate Ratio 12AM  10  6am  8  11am 10  5pm 10       Insulin Sensitivity Factor 12AM  55   6am 37  10pm 55         Target Blood Glucose 12AM  150  6am 110  11pm 150          Hypoglycemia: Able to feel low blood sugars.  No glucagon needed recently.  Insulin Pump and CGM download:   - using 42 units per day   - 63% basal and 37% bolus   - Avg Bg 160   - Target range: in target 67%, above target 31% and below target 2%.  Med-alert ID: Not currently wearing. Injection sites: bilateral hips  Annual labs due: 04/2019 Ophthalmology due: 2019 December. Discussed importance of dilated eye exam.     3. ROS: Greater than 10 systems reviewed with pertinent positives listed in HPI, otherwise neg. Constitutional: Energy and appetite have improved. Sleeping well.  Eyes: No changes in vision. Denies blurry vision. Wears glasses.  Ears/Nose/Mouth/Throat: No difficulty swallowing. No throat pain  Cardiovascular: No palpitations. No tachycardia. No chest pain Respiratory: No increased work of breathing.  Gastrointestinal: No constipation or diarrhea.  Genitourinary: No nocturia, no polyuria Neurologic: Normal sensation, no tremor Endocrine: No polydipsia.  No hyperpigmentation Psychiatric: Normal affect. Denies anxiety and depression.  Past Medical History:   Past Medical History:  Diagnosis Date  . Allergy    seasonal allergies  . Goiter   . Hypoglycemia associated with diabetes (HCC)   . Physical growth delay   . Type 1 diabetes mellitus in patient age 66-19 years with HbA1C goal below 7.5 diagnosed age 40  . VSD (ventricular septal defect)     Medications:  Outpatient Encounter Medications as of 03/01/2019  Medication Sig Note  . butalbital-acetaminophen-caffeine (FIORICET, ESGIC) 50-325-40 MG tablet Take 1 tablet by mouth every 6 (six) hours as needed for headache. Reported  on 04/22/2016 (Patient not taking: Reported on 07/04/2018) 11/30/2018: PRN  . Calcium Carb-Cholecalciferol (CALCIUM-VITAMIN D) 500-200 MG-UNIT tablet Take 1 tablet by mouth daily. 04/28/2017: Unsure of dose. Target brand.  Takes one daily  . Continuous Blood Gluc Sensor (FREESTYLE LIBRE SENSOR SYSTEM) MISC Use a sensor every 10 days NDC [phone] (Patient not taking: Reported on 05/18/2018) 05/01/2018: Had problems with skin sensitivity  . escitalopram (LEXAPRO) 10 MG tablet Take 10 mg by mouth daily.   Marland Kitchen glucagon 1 MG injection Use for Severe Hypoglycemia . Inject 1mg  intramuscularly 05/17/2018: Has if needed  . glucose blood (BAYER CONTOUR NEXT TEST) test strip CHECK BLOOD SURGAR 10 TIMES DAILY   . HUMALOG 100 UNIT/ML injection USE 300 UNITS IN INSULIN PUMP EVERY 48 HOURS. 11/30/2018: Keeps in case of pump failure  . insulin aspart (NOVOLOG) 100 UNIT/ML injection Use 300 units in insulin pump every 48 hours.   . Insulin Glargine (BASAGLAR KWIKPEN) 100 UNIT/ML SOPN Use up to 50 units daily. 05/18/2018: Has in case of pump failure  . insulin glulisine (APIDRA) 100 UNIT/ML injection USE 300 UNITS IN INSULIN  PUMP EVERY 48 HOURS   . Insulin Pen Needle 31G X 5 MM MISC Check Blood glucose 10x day   . loratadine (CLARITIN) 10 MG tablet Take 10 mg by mouth daily.   . Multiple Vitamin (MULTIVITAMIN) tablet Take 1 tablet by mouth daily.     Kathrynn Running Estrad-Fe Biphas (LO LOESTRIN FE PO) Take by mouth.   . ondansetron (ZOFRAN-ODT) 4 MG disintegrating tablet Take 1 tablet (4 mg total) by mouth every 8 (eight) hours as needed for nausea or vomiting.    No facility-administered encounter medications on file as of 03/01/2019.     Allergies: No Known Allergies  Surgical History: Past Surgical History:  Procedure Laterality Date  . FRENULECTOMY, LINGUAL    . periodontal surgery      Family History:  Family History  Problem Relation Age of Onset  . Allergies Mother   . Thyroid disease Mother   . Thyroid  disease Maternal Grandmother   . Parkinson's disease Maternal Grandmother   . Arthritis Maternal Grandfather   . Arthritis Paternal Grandmother   . Heart attack Paternal Grandfather   . Cancer Neg Hx       Social History: Lives with: Currently living with Parents.  [employer] college for [employer].   Physical Exam:  There were no vitals filed for this visit. There were no vitals taken for this visit. Body mass index: body mass index is unknown because there is no height or weight on file. Growth percentile SmartLinks can only be used for patients less than 42 years old.  Ht Readings from Last 3 Encounters:  02/21/19 5\' 4"  (1.626 m)  11/30/18 5\' 4"  (1.626 m)  08/31/18 5\' 4"  (1.626 m)   Wt Readings from Last 3 Encounters:  02/21/19 124 lb 6.4 oz (56.4 kg)  11/30/18 122 lb 9.6 oz (55.6 kg)  08/31/18 123 lb 6.4 oz (56 kg)    Physical Exam   General: Well developed, well nourished female in no acute distress.  Alert and oriented.  Head: Normocephalic, atraumatic.   Eyes:  Pupils equal and round. EOMI.   Sclera white.  Ears/Nose/Mouth/Throat: Nares patent, no nasal drainage.  Normal dentition, Neck: supple,  no thyromegaly Cardiovascular: No obvious cyanosis.  Respiratory: No increased work of breathing.   Skin: warm, dry.  No rash or lesions. Neurologic: alert and oriented, normal speech, no tremor    Labs:  Results for orders placed or performed in visit on 02/21/19  POCT Glucose (Device for Home Use)  Result Value Ref Range   Glucose Fasting, POC 160 (A) 70 - 99 mg/dL   POC Glucose     Hemoglobin A1c was 9.8% on 02/21/2019   Assessment/Plan: Shelbey is a 24 y.o. female with type 1 diabetes in poor but improving control. She is benefiting from CGM therapy and also control IQ on insulin pump. Her blood sugars are spending more time in target range. She needs slight reduction to basal rate overnight to prevent hypoglycemia.   1-3. DM w/o complication type I,  uncontrolled (HCC)/Hyperglycemia/ Elevated A1c -  Reviewed insulin pump and CGm download. Discussed trends and patterns.  - Encouraged to bolus 15 minutes before eating. Bolus for all carb intake.  - Rotate pump site to prevent scar tissue.  - Reviewed carb counting.  - Set up sleep schedule for Tslim pump overnight.  - WEar medical alert ID at all times.    4. Insulin pump titration  Basal Rates 12AM 0.65  630Am  1.25--> 1.20   10am 1.80  9pm 1.35  11pm 1.35--> 1.25    5. Maladaptive health behaviors affecting medical condition - Discussed barriers to care.  - Praise given for the many improvements she has made.  - Will fill out DMV paperwork.   Follow-up:  3 months.     When a patient is on insulin, intensive monitoring of blood glucose levels is necessary to avoid hyperglycemia and hypoglycemia. Severe hyperglycemia/hypoglycemia can lead to hospital admissions and be life threatening.    Paula Short,  FNP-C  Pediatric Specialist  [address]  [place], [address]  Tele: [phone]

## 2019-03-16 ENCOUNTER — Telehealth (INDEPENDENT_AMBULATORY_CARE_PROVIDER_SITE_OTHER): Payer: Self-pay | Admitting: Family

## 2019-03-16 NOTE — Telephone Encounter (Signed)
°  Who's calling (name and relationship to patient) : Toia Chatelain, mom  Best contact number: [phone]  Provider they see: Gretchen Short  Reason for call: Mom called stating that she needs DMV paperwork mailed to [facility] so that she can keep it on file. DMV already received the paperwork via faxed, mom just would like to have the original copy to keep for their records. Believes that they were are the front desk per Maralyn Sago, but now is requesting we mail them so she doesn't have to come into the office. Please advise.     PRESCRIPTION REFILL ONLY  Name of prescription:  Pharmacy:

## 2019-03-19 NOTE — Telephone Encounter (Signed)
error 

## 2019-03-19 NOTE — Telephone Encounter (Signed)
paperwoek mailed to patients home in [place].

## 2019-03-20 DIAGNOSIS — E1065 Type 1 diabetes mellitus with hyperglycemia: Secondary | ICD-10-CM | POA: Diagnosis not present

## 2019-04-13 DIAGNOSIS — E1065 Type 1 diabetes mellitus with hyperglycemia: Secondary | ICD-10-CM | POA: Diagnosis not present

## 2019-04-19 ENCOUNTER — Other Ambulatory Visit: Payer: Self-pay | Admitting: Neurology

## 2019-05-01 DIAGNOSIS — Z113 Encounter for screening for infections with a predominantly sexual mode of transmission: Secondary | ICD-10-CM | POA: Diagnosis not present

## 2019-05-01 DIAGNOSIS — F419 Anxiety disorder, unspecified: Secondary | ICD-10-CM | POA: Diagnosis not present

## 2019-05-01 DIAGNOSIS — Z6822 Body mass index (BMI) 22.0-22.9, adult: Secondary | ICD-10-CM | POA: Diagnosis not present

## 2019-05-01 DIAGNOSIS — Z01419 Encounter for gynecological examination (general) (routine) without abnormal findings: Secondary | ICD-10-CM | POA: Diagnosis not present

## 2019-05-01 LAB — HM PAP SMEAR: HM Pap smear: NEGATIVE

## 2019-05-01 NOTE — Progress Notes (Signed)
Chief Complaint  Patient presents with  . Annual Exam    Paula Miller is a 23 y.o. female who presents for a complete physical.  She has the following concerns:  She has been more tired recently, and mother wants [name] to check to see if it could be related to her VSD.  She reports having a h/o VSD, that was reportedly too small to fix.  She denies any chest pain, palpitations, shortness of breath, edema.  Regarding her fatigue, she reports that she is sleeping well.  A few weeks ago fatigue was much worse, is back to her baseline now. She thinks stress may be a factor. Has some allergies/congestion, not particularly correlating to the worsened fatigue.  She was started on Lexapro last year by her GYN for anxiety.  She finds that it helped a lot.  Some days she has more anxiety than others.  She no longer feels anxious as often as she did, just when there are extra things going on. She feels like it has been very helpful, and denies side effects. She takes the Lexapro in the evening.  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  Lives alone in apartment in [place], though her boyfriend has been living with her for the last 2 months.  This is related to the pandemic.  He is in school in [place], getting Ph.D. in nuclear physics, and will be going back to [place] soon .  School is going well (online due to pandemic).  Congestion/allergies. She had sinus infections treated at [facility] in February and October 2019.  She uses Claritin and flonase daily. Currently has her baseline congestion, no discolored mucus or sinus pain.  Vitamin D deficiency:  Last check was normal in 04/2018, with level of 43.1.  At that time she was taking Calcium with D once daily, plus MVI.  The level the year prior to that had been low at 25.  She continues on the same  supplements.  Diabetes:Type 1 diabetes in poor control. She is followed by peds endo, last seen in April (doesn't appear to have f/u scheduled, due next in July). She got a new pump and continuous glucose monitoring system this Spring. Control has improved since the change. (at her last visit with endo, was having lows overnight, settings adjusted for sleep times). She reports doing "a lot better".  Hypoglycemia is less frequent (only to 60's, not as often). Polydipsia/polyuria related to high blood sugars is also less frequent. She reports there are alarms on the monitor which help.  Lab Results  Component Value Date   HGBA1C 9.8 (A) 02/21/2019   She checks feet regularly, denies concerns. Denies numbness, tingling, burning in feet. Sees ophtho once yearly, switched to adult ophtho last year. She switched to an adult eye doctor, got new contacts in June, but reports she had full eye exam in 10/2018, no retinopathy.   Chronic migraines without aura: last saw neuro a year ago, 04/2018.  She tapered off the topamax last year. Doing well, with infrequent migraines after getting daith ear piercing (03/2018).  Lexapro was started about a month after the piercing, so that may also be a factor in improved migraines. She uses butalbtal prn.  Some increased stress has contributed to migraines, along with congestion.  She has been getting them a little more often recently (about every 2 weeks), but aren't as severe, doesn't  always require butalbital.  Sees GYN, Dr. Helane Rima.  Reports she had pap smear yesterday. No records were received yet.  She originally started on OCPs for heavy cycles. She currently also uses for contraception, but reports also using condoms. Cycles are normal, no irregular bleeding or heavy bleeding. Discussed HPV vaccination again with mother recently, doesn't want her to get.  GYNpreviouslyprescribed Belviq to help with the weight loss, started in 11/2015. This was stopped when she  went on Topamax for migraines. She is no longer on topamax; Belviq is no longer available. She gained some weight since her last physical, weight had remained stable since October, until the last couple of months (pandemic, boyfriend living with her). She remains active.   Immunization History  Administered Date(s) Administered  . DTaP 10/23/1996, 12/25/1996, 02/26/1997, 03/12/1998, 05/10/2001  . Hepatitis A 01/24/2006, 03/10/2007  . Hepatitis B 10/23/1996, 01/04/1997, 06/09/1997  . HiB (PRP-OMP) 10/23/1996, 12/25/1996, 02/26/1997, 12/11/1997  . IPV 10/23/1996, 12/25/1996, 03/12/1998, 05/10/2001  . Influenza Split 08/22/2016  . Influenza,inj,Quad PF,6+ Mos 08/08/2013, 08/31/2018  . Influenza-Unspecified 09/19/2002, 09/15/2005, 09/13/2008, 07/15/2011  . MMR 12/11/1997, 05/10/2001  . Meningococcal B, OMV 05/07/2016, 06/23/2016  . Meningococcal Polysaccharide 02/13/2009, 05/24/2014  . PPD Test 05/01/2018  . Td 04/28/2017  . Tdap 05/10/2007  . Varicella 09/16/1997   Hasn't had HPV vaccines. Mother does not want her to have them. NCIR was reviewed, and Varicella was marked as "immune"  She reports she had varicella infection as a child. No Prevnar-13 received as a child. Last Pap smear:  Yesterday (05/01/2019) Dentist: twice yearly Ophtho: yearly  Exercise: walking 3-4x/week, at least 30 minutes. Walks boyfriend's brother's dog, who likes to run (is good cardio).  Lifting heavy items at work. Trying to get back into doing yoga.  Past Medical History:  Diagnosis Date  . Allergy    seasonal allergies  . Goiter   . Hypoglycemia associated with diabetes ([place])   . Physical growth delay   . Type 1 diabetes mellitus in patient age 54-19 years with HbA1C goal below 7.5 diagnosed age 36  . VSD (ventricular septal defect)     Past Surgical History:  Procedure Laterality Date  . FRENULECTOMY, LINGUAL    . periodontal surgery     Social History   Socioeconomic History  . Marital status:  Single    Spouse name: Not on file  . Number of children: 0  . Years of education: 12+  . Highest education level: Not on file  Occupational History  . Occupation: Ship broker- [employer]  Social Needs  . Financial resource strain: Not on file  . Food insecurity:    Worry: Not on file    Inability: Not on file  . Transportation needs:    Medical: Not on file    Non-medical: Not on file  Tobacco Use  . Smoking status: Never Smoker  . Smokeless tobacco: Never Used  Substance and Sexual Activity  . Alcohol use: Yes    Alcohol/week: 1.0 standard drinks    Types: 1 Glasses of wine per week    Comment: 1 glass of wine/week  . Drug use: No  . Sexual activity: Yes    Partners: Male    Birth control/protection: Condom, Pill  Lifestyle  . Physical activity:    Days per week: Not on file    Minutes per session: Not on file  . Stress: Not on file  Relationships  . Social connections:    Talks on phone: Not on file  Gets together: Not on file    Attends religious service: Not on file    Active member of club or organization: Not on file    Attends meetings of clubs or organizations: Not on file    Relationship status: Not on file  . Intimate partner violence:    Fear of current or ex partner: Not on file    Emotionally abused: Not on file    Physically abused: Not on file    Forced sexual activity: Not on file  Other Topics Concern  . Not on file  Social History Narrative   Graduated from [employer].   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   Parents live in [place], 1 younger brother.    (mother--[name] Kastens, grandparents are the [place])   1 dog at home      Caffeine use: daily, 1 cup of coffee      Updated 04/2019     Family History  Problem Relation Age of Onset  . Allergies Mother   . Thyroid disease Mother   . Thyroid disease Maternal  Grandmother   . Parkinson's disease Maternal Grandmother   . Arthritis Maternal Grandfather   . Arthritis Paternal Grandmother   . Heart attack Paternal Grandfather   . Cancer Neg Hx     Outpatient Encounter Medications as of 05/02/2019  Medication Sig Note  . butalbital-acetaminophen-caffeine (FIORICET) 50-325-40 MG tablet Take 1 tablet by mouth every 6 (six) hours as needed for headache. 1 tab q6h prn.  Call [phone] to schedule appt. 05/02/2019: Last took last week  . Calcium Carb-Cholecalciferol (CALCIUM-VITAMIN D) 500-200 MG-UNIT tablet Take 1 tablet by mouth daily. 04/28/2017: Unsure of dose. [employer] brand.  Takes one daily  . escitalopram (LEXAPRO) 10 MG tablet Take 10 mg by mouth daily.   Marland Kitchen HUMALOG 100 UNIT/ML injection USE 300 UNITS IN INSULIN PUMP EVERY 48 HOURS. 11/30/2018: Keeps in case of pump failure  . insulin aspart (NOVOLOG) 100 UNIT/ML injection Use 300 units in insulin pump every 48 hours.   . Insulin Pen Needle 31G X 5 MM MISC Check Blood glucose 10x day   . loratadine (CLARITIN) 10 MG tablet Take 10 mg by mouth daily.   . Multiple Vitamin (MULTIVITAMIN) tablet Take 1 tablet by mouth daily.     Cyndie Chime Estrad-Fe Biphas (LO LOESTRIN FE PO) Take by mouth.   . Continuous Blood Gluc Sensor ([place]) MISC Use a sensor every 10 days NDC [phone] (Patient not taking: Reported on 05/02/2019) 05/01/2018: Had problems with skin sensitivity  . glucagon 1 MG injection Use for Severe Hypoglycemia . Inject 87m intramuscularly (Patient not taking: Reported on 05/02/2019) 05/17/2018: Has if needed  . Insulin Glargine (BASAGLAR KWIKPEN) 100 UNIT/ML SOPN Use up to 50 units daily. (Patient not taking: Reported on 05/02/2019) 05/18/2018: Has in case of pump failure  . ondansetron (ZOFRAN-ODT) 4 MG disintegrating tablet Take 1 tablet (4 mg total) by mouth every 8 (eight) hours as needed for nausea or vomiting. (Patient not taking: Reported on 05/02/2019)   . [DISCONTINUED]  glucose blood (BAYER CONTOUR NEXT TEST) test strip CHECK BLOOD SURGAR 10 TIMES DAILY (Patient not taking: Reported on 03/01/2019)   . [DISCONTINUED] insulin glulisine (APIDRA) 100 UNIT/ML injection USE 300 UNITS IN INSULIN  PUMP EVERY 48 HOURS (Patient not taking: Reported on 03/01/2019)    No facility-administered encounter medications on  file as of 05/02/2019.    No Known Allergies  ROS: The patient denies anorexia, fever, vision changes, decreased hearing, ear pain, sore throat, breast concerns, chest pain, palpitations, dizziness, syncope, dyspnea on exertion, cough, swelling, nausea, vomiting, diarrhea, constipation, abdominal pain, melena, hematochezia, indigestion/heartburn, hematuria, incontinence, dysuria, irregular menstrual cycles, vaginal discharge, odor or itch, genital lesions, joint pains, numbness, tingling, weakness, tremor, suspicious skin lesions, abnormal bleeding/bruising, or enlarged lymph nodes. Denies depresssion, insomnia Congestion/sinuses fairly chronically. No purulent drainage. Headaches improved s/p daith piercing and lexapro, increased frequency due to stress recently, but overall tolerable. Polydipsia and polyuria with high blood sugars, much less often. Weight had been stable for a while (after topamax stopped); 6# increase in the last 2 months noted Anxiety per HPI   PHYSICAL EXAM:  BP 100/68   Pulse (!) 114   Temp 98.2 F (36.8 C) (Oral)   Ht 5' 4"  (1.626 m)   Wt 130 lb 12.8 oz (59.3 kg)   SpO2 98%   BMI 22.45 kg/m   Wt Readings from Last 3 Encounters:  05/02/19 130 lb 12.8 oz (59.3 kg)  02/21/19 124 lb 6.4 oz (56.4 kg)  11/30/18 122 lb 9.6 oz (55.6 kg)   Wt was 117# at CPE last year.  General Appearance:  Alert, cooperative, no distress, appears stated age  Head:  Normocephalic, without obvious abnormality, atraumatic  Eyes:  PERRL, conjunctiva/corneas clear, EOM's intact, fundi benign  Ears:  Normal TM's and external ear canals  Nose:  Not examined (wearing mask due to COVID-19 pandemic)  Throat: Not examined (wearing mask due to COVID-19 pandemic)  Neck: Supple, no lymphadenopathy; thyroid: no enlargement/ tenderness/nodules; no carotid bruit or JVD  Back:  Spine nontender, no curvature, ROM normal, no CVA tenderness  Lungs:  Clear to auscultation bilaterally without wheezes, rales or ronchi; respirations unlabored  Chest Wall:  No tenderness or deformity  Heart:  Regular rate and rhythm, S1 and S2 normal, no murmur, rub or gallop  Breast Exam: Deferred to GYN   Abdomen:  Soft, non-tender, nondistended, normoactive bowel sounds,  no masses, no hepatosplenomegaly  Genitalia: deferred to GYN  Rectal:  Not performed  Extremities: No clubbing, cyanosis or edema. Normal monofilament exam  Pulses: 2+ and symmetric all extremities  Skin: Skin color, texture, turgor normal, no rashes.  Lymph nodes: Cervical, supraclavicular, andinguinalnodes normal  Neurologic: CNII-XII intact, normal strength, sensation and gait; reflexes 2+ and symmetric throughout   Psych:  Normal mood, affect, eye contact, speech, hygiene and grooming.    ASSESSMENT/PLAN:  Annual physical exam  Uncontrolled type 1 diabetes mellitus with diabetic autonomic neuropathy ([place]) - under care of endo; improved control with change in monitor/pump - Plan: Microalbumin / creatinine urine ratio, TSH, Comprehensive metabolic panel, Lipid panel  Migraine without aura and without status migrainosus, not intractable - reviewed stress reduction, how to treat early. Improved overall, butalbital prn severe headaches  Vitamin D deficiency - previously controlled on current supplements. Recheck level due to worsening fatigue recently - Plan: VITAMIN D 25 Hydroxy (Vit-D Deficiency, Fractures)  Seasonal allergic rhinitis, unspecified trigger - cont current measures; use sudafed and mucinex prn flare of symptoms,  sinus pain  Need for pneumococcal vaccination - Plan: Pneumococcal conjugate vaccine 13-valent  Fatigue, unspecified type - Ddx reviewed. seems to fluctuate, improved now.  check labs. Normal exam.  Reassured of normal cardiac exam - Plan: TSH, VITAMIN D 25 Hydroxy (Vit-D Deficiency, Fractures), CBC with Differential/Platelet, Comprehensive metabolic panel, Lipid panel   c-met, lipid,  CBC, TSH, urine microalb.  D since increased fatigue  HPV recommended, mother still doesn't want her to get.  Encouraged her to do her own research and discuss with mother.  Risks, benefits, SE reviewed.  No Prevnar documented from childhood (NCIR reviewed), likely not started until a couple of years after she was born. Given her DM, recommend she get one. Given Prevnar-13 today.  Risks/SE reviewed.   Discussed monthly self breast exams,at least 30 minutes of aerobic activity at least 5 days/week, weight bearing exercise at least 2x/week; proper sunscreen use reviewed; healthy diet, including goals of calcium and vitamin D intake reviewed; regular seatbelt use. Immunization recommendations discussed--continue yearly flu shots; refusesGardisil series. Encouraged her to consider this and to discuss with her mother, as well as GYN at f/u visits.  Prevnar-13 given today.  F/u 1 year, sooner prn Continue regular follow-up with endocrinologist, yearly ophtho. Discussed stress reduction, techniques, if/when to consider counseling.   GET PAP/RECORDS FROM DR Helane Rima

## 2019-05-01 NOTE — Patient Instructions (Addendum)
  HEALTH MAINTENANCE RECOMMENDATIONS:  It is recommended that you get at least 30 minutes of aerobic exercise at least 5 days/week (for weight loss, you may need as much as 60-90 minutes). This can be any activity that gets your heart rate up. This can be divided in 10-15 minute intervals if needed, but try and build up your endurance at least once a week.  Weight bearing exercise is also recommended twice weekly.  Eat a healthy diet with lots of vegetables, fruits and fiber.  "Colorful" foods have a lot of vitamins (ie green vegetables, tomatoes, red peppers, etc).  Limit sweet tea, regular sodas and alcoholic beverages, all of which has a lot of calories and sugar.  Up to 1 alcoholic drink daily may be beneficial for women (unless trying to lose weight, watch sugars).  Drink a lot of water.  Calcium recommendations are 1200-1500 mg daily (1500 mg for postmenopausal women or women without ovaries), and vitamin D 1000 IU daily.  This should be obtained from diet and/or supplements (vitamins), and calcium should not be taken all at once, but in divided doses.  Monthly self breast exams and yearly mammograms for women over the age of 71 is recommended.  Sunscreen of at least SPF 30 should be used on all sun-exposed parts of the skin when outside between the hours of 10 am and 4 pm (not just when at beach or pool, but even with exercise, golf, tennis, and yard work!)  Use a sunscreen that says "broad spectrum" so it covers both UVA and UVB rays, and make sure to reapply every 1-2 hours.  Remember to change the batteries in your smoke detectors when changing your clock times in the spring and fall. Carbon monoxide detectors are recommended for your home.  Use your seat belt every time you are in a car, and please drive safely and not be distracted with cell phones and texting while driving.  Safe sex practices are recommended, including regular use of condoms.  Today you received a pneumonia vaccine  (prevnar-13), since you have diabetes and never received the childhood series (which started being given routinely a few years later).  Please re-consider HPV vaccination with Gardisil to decrease risks for cervical cancer.

## 2019-05-02 ENCOUNTER — Other Ambulatory Visit: Payer: Self-pay

## 2019-05-02 ENCOUNTER — Encounter: Payer: Self-pay | Admitting: Family Medicine

## 2019-05-02 ENCOUNTER — Ambulatory Visit: Payer: BC Managed Care – PPO | Admitting: Family Medicine

## 2019-05-02 VITALS — BP 100/68 | HR 114 | Temp 98.2°F | Ht 64.0 in | Wt 130.8 lb

## 2019-05-02 DIAGNOSIS — E1065 Type 1 diabetes mellitus with hyperglycemia: Secondary | ICD-10-CM | POA: Diagnosis not present

## 2019-05-02 DIAGNOSIS — J302 Other seasonal allergic rhinitis: Secondary | ICD-10-CM

## 2019-05-02 DIAGNOSIS — G43009 Migraine without aura, not intractable, without status migrainosus: Secondary | ICD-10-CM | POA: Diagnosis not present

## 2019-05-02 DIAGNOSIS — Z0000 Encounter for general adult medical examination without abnormal findings: Secondary | ICD-10-CM

## 2019-05-02 DIAGNOSIS — R5383 Other fatigue: Secondary | ICD-10-CM

## 2019-05-02 DIAGNOSIS — Z23 Encounter for immunization: Secondary | ICD-10-CM | POA: Diagnosis not present

## 2019-05-02 DIAGNOSIS — E1043 Type 1 diabetes mellitus with diabetic autonomic (poly)neuropathy: Secondary | ICD-10-CM

## 2019-05-02 DIAGNOSIS — IMO0002 Reserved for concepts with insufficient information to code with codable children: Secondary | ICD-10-CM

## 2019-05-02 DIAGNOSIS — E559 Vitamin D deficiency, unspecified: Secondary | ICD-10-CM | POA: Diagnosis not present

## 2019-05-02 LAB — LIPID PANEL

## 2019-05-03 LAB — CBC WITH DIFFERENTIAL/PLATELET
Basophils Absolute: 0.1 10*3/uL (ref 0.0–0.2)
Basos: 1 %
EOS (ABSOLUTE): 0.3 10*3/uL (ref 0.0–0.4)
Eos: 4 %
Hematocrit: 44.4 % (ref 34.0–46.6)
Hemoglobin: 14.5 g/dL (ref 11.1–15.9)
Immature Grans (Abs): 0 10*3/uL (ref 0.0–0.1)
Immature Granulocytes: 1 %
Lymphocytes Absolute: 2.6 10*3/uL (ref 0.7–3.1)
Lymphs: 34 %
MCH: 29.4 pg (ref 26.6–33.0)
MCHC: 32.7 g/dL (ref 31.5–35.7)
MCV: 90 fL (ref 79–97)
Monocytes Absolute: 0.5 10*3/uL (ref 0.1–0.9)
Monocytes: 7 %
Neutrophils Absolute: 4.1 10*3/uL (ref 1.4–7.0)
Neutrophils: 53 %
Platelets: 291 10*3/uL (ref 150–450)
RBC: 4.94 x10E6/uL (ref 3.77–5.28)
RDW: 11.8 % (ref 11.7–15.4)
WBC: 7.7 10*3/uL (ref 3.4–10.8)

## 2019-05-03 LAB — COMPREHENSIVE METABOLIC PANEL
ALT: 20 IU/L (ref 0–32)
AST: 17 IU/L (ref 0–40)
Albumin/Globulin Ratio: 1.5 (ref 1.2–2.2)
Albumin: 4.2 g/dL (ref 3.9–5.0)
Alkaline Phosphatase: 57 IU/L (ref 39–117)
BUN/Creatinine Ratio: 14 (ref 9–23)
BUN: 11 mg/dL (ref 6–20)
Bilirubin Total: 0.3 mg/dL (ref 0.0–1.2)
CO2: 22 mmol/L (ref 20–29)
Calcium: 9.2 mg/dL (ref 8.7–10.2)
Chloride: 97 mmol/L (ref 96–106)
Creatinine, Ser: 0.79 mg/dL (ref 0.57–1.00)
GFR calc Af Amer: 123 mL/min/{1.73_m2} (ref >59)
GFR calc non Af Amer: 107 mL/min/{1.73_m2} (ref >59)
Globulin, Total: 2.8 g/dL (ref 1.5–4.5)
Glucose: 192 mg/dL — ABNORMAL HIGH (ref 65–99)
Potassium: 4.7 mmol/L (ref 3.5–5.2)
Sodium: 136 mmol/L (ref 134–144)
Total Protein: 7 g/dL (ref 6.0–8.5)

## 2019-05-03 LAB — LIPID PANEL
Chol/HDL Ratio: 2 ratio (ref 0.0–4.4)
Cholesterol, Total: 146 mg/dL (ref 100–199)
HDL: 73 mg/dL (ref >39)
LDL Calculated: 56 mg/dL (ref 0–99)
Triglycerides: 83 mg/dL (ref 0–149)
VLDL Cholesterol Cal: 17 mg/dL (ref 5–40)

## 2019-05-03 LAB — MICROALBUMIN / CREATININE URINE RATIO
Creatinine, Urine: 236.7 mg/dL
Microalb/Creat Ratio: 7 mg/g creat (ref 0–29)
Microalbumin, Urine: 16 ug/mL

## 2019-05-03 LAB — VITAMIN D 25 HYDROXY (VIT D DEFICIENCY, FRACTURES): Vit D, 25-Hydroxy: 38.6 ng/mL (ref 30.0–100.0)

## 2019-05-03 LAB — TSH: TSH: 2.6 u[IU]/mL (ref 0.450–4.500)

## 2019-05-29 ENCOUNTER — Telehealth (INDEPENDENT_AMBULATORY_CARE_PROVIDER_SITE_OTHER): Payer: Self-pay | Admitting: Family

## 2019-05-29 NOTE — Telephone Encounter (Signed)
Paula Miller's employer has recently start requiring her to work in office. She has many concerns with COVID and would like Paula Miller or Paula Miller to please call her.

## 2019-05-29 NOTE — Telephone Encounter (Signed)
Returned TC to [place], said that she is concern about going back to work, work in [employer] where there are a lot of people coming in without proper PPE. She spoke with her supervisor and she wants to know what is a safe number of people working in a facility. (Can it be less than 10 people?) Advised will check with Spenser and see what he says and call her back tomorrow.

## 2019-05-30 ENCOUNTER — Telehealth: Payer: Self-pay | Admitting: Family Medicine

## 2019-05-30 ENCOUNTER — Encounter (INDEPENDENT_AMBULATORY_CARE_PROVIDER_SITE_OTHER): Payer: Self-pay | Admitting: *Deleted

## 2019-05-30 NOTE — Telephone Encounter (Signed)
Requested records received from [facility]  

## 2019-05-30 NOTE — Telephone Encounter (Signed)
Attempted to return call but no answer LVM to advise per Spenser that, "Unable to give specific number. It is vital that she is able to social distance at least 6 feet apart from other people. Face mask should be worn at all times. Frequent washing of hands." if you have further questions or concerns please let us know.

## 2019-05-30 NOTE — Telephone Encounter (Signed)
Unable to give specific number. It is vital that she is able to social distance at least 6 feet apart from other people. Face mask should be worn at all times. Frequent washing of hands.

## 2019-06-04 ENCOUNTER — Other Ambulatory Visit: Payer: Self-pay

## 2019-06-04 ENCOUNTER — Ambulatory Visit (INDEPENDENT_AMBULATORY_CARE_PROVIDER_SITE_OTHER): Payer: BC Managed Care – PPO | Admitting: Family

## 2019-06-04 ENCOUNTER — Encounter (INDEPENDENT_AMBULATORY_CARE_PROVIDER_SITE_OTHER): Payer: Self-pay | Admitting: Family

## 2019-06-04 DIAGNOSIS — E1065 Type 1 diabetes mellitus with hyperglycemia: Secondary | ICD-10-CM | POA: Diagnosis not present

## 2019-06-04 DIAGNOSIS — R739 Hyperglycemia, unspecified: Secondary | ICD-10-CM | POA: Diagnosis not present

## 2019-06-04 DIAGNOSIS — F54 Psychological and behavioral factors associated with disorders or diseases classified elsewhere: Secondary | ICD-10-CM

## 2019-06-04 DIAGNOSIS — Z4681 Encounter for fitting and adjustment of insulin pump: Secondary | ICD-10-CM | POA: Diagnosis not present

## 2019-06-04 DIAGNOSIS — IMO0001 Reserved for inherently not codable concepts without codable children: Secondary | ICD-10-CM

## 2019-06-04 NOTE — Progress Notes (Signed)
This is a Pediatric Specialist E-Visit follow up consult provided via , WebEx Paula GensMargaret Hubbert  consented to an E-Visit consult today.  Location of patient: Paula Miller is at [address] [place] [id number] [place]  Location of provider: Crist InfanteSpenser Donzel Romack,FNp  is at Pediatric Specialist office  Patient was referred by [name], Eve, MD   The following participants were involved in this E-Visit:Jaime PendergrassSlemons, RMA Gretchen ShortSpenser Madhavi Hamblen, FNP Paula Miller patient  Chief Complain/ Reason for E-Visit today: Type 1 follow up  Total time on call: This call lasted >25 mintues. More then 50% of the visit was devoted to counseling.  Follow up: 3 months.     Pediatric Endocrinology Diabetes Consultation Follow-up Visit  Paula Miller [birth year] [id number]  Chief Complaint: Follow-up type 1 diabetes   [name], Eve, MD   HPI: Paula Miller  is a 23 y.o. female presenting for follow-up of type 1 diabetes.  1. Paula Miller was diagnosed with new onset type 1 diabetes mellitus, dehydration, and ketonuria on 06/09/2005. She was initially treated with a multiple daily injection regimen of Lantus and Novolog insulins.  We converted her to a Medtronic insulin pump in November of 2007.  Although the patient does have a palpable goiter, she has remained euthyroid. Her CMPs, lipid panels, and urinary microalbumin to creatinine ratios have always been normal.   2. Since last visit to [facility] on 02/2019 , she has been well.  No ER visits or hospitalizations.   She is using Tandem Tslim insulin pump and Dexcom CGM with Control IQ therapy. She reports that she is doing excellent with her new technology and feels like her blood sugars have been better then she can remember. She is bolusing when she eats and finds the extra bolus that the pump gives her very helpful. Rotating pump sites about every 3 days. Hypoglycemia is occurring most commonly between 8pm-1am.    Insulin regimen: Tandem Tslim  Basal  Rates 12AM 0.65  630Am  1.20  10am 1.80  9pm 1.35  11pm 1.25    Insulin to Carbohydrate Ratio 12AM  10  6am  8  11am 10  5pm 10       Insulin Sensitivity Factor 12AM  55   6am 37  10pm 55         Target Blood Glucose 12AM  150  6am 110  11pm 150          Hypoglycemia: Able to feel low blood sugars.  No glucagon needed recently.  Insulin Pump and CGM download:   - Avg Bg 156  - Target Range: in target 71%, above target 28% and below target 1%   - Using 42 units per day   - 78% basal and 22% bolus  Med-alert ID: Not currently wearing. Injection sites: bilateral hips  Annual labs due: 04/2019 Ophthalmology due: 2019 December. Discussed importance of dilated eye exam.     3. ROS: Greater than 10 systems reviewed with pertinent positives listed in HPI, otherwise neg. Constitutional: Energy and appetite are good. Sleeping well.   Eyes: No changes in vision. Denies blurry vision. Wears glasses.  Ears/Nose/Mouth/Throat: No difficulty swallowing. No throat pain  Cardiovascular: No palpitations. No tachycardia. No chest pain Respiratory: No increased work of breathing.  Gastrointestinal: No constipation or diarrhea.  Genitourinary: No nocturia, no polyuria Neurologic: Normal sensation, no tremor Endocrine: No polydipsia.  No hyperpigmentation Psychiatric: Normal affect. Denies anxiety and depression.   Past Medical History:   Past Medical History:  Diagnosis Date  . Allergy    seasonal allergies  . Goiter   . Hypoglycemia associated with diabetes (HCC)   . Physical growth delay   . Type 1 diabetes mellitus in patient age 23-19 years with HbA1C goal below 7.5 diagnosed age 78  . VSD (ventricular septal defect)     Medications:  Outpatient Encounter Medications as of 06/04/2019  Medication Sig Note  . butalbital-acetaminophen-caffeine (FIORICET) 50-325-40 MG tablet Take 1 tablet by mouth every 6 (six) hours as needed for headache. 1 tab q6h prn.  Call [phone]  to schedule appt. 05/02/2019: Last took last week  . Calcium Carb-Cholecalciferol (CALCIUM-VITAMIN D) 500-200 MG-UNIT tablet Take 1 tablet by mouth daily. 04/28/2017: Unsure of dose. Target brand.  Takes one daily  . Continuous Blood Gluc Sensor (FREESTYLE LIBRE SENSOR SYSTEM) MISC Use a sensor every 10 days NDC [id number] (Patient not taking: Reported on 05/02/2019) 05/01/2018: Had problems with skin sensitivity  . escitalopram (LEXAPRO) 10 MG tablet Take 10 mg by mouth daily.   Marland Kitchen. glucagon 1 MG injection Use for Severe Hypoglycemia . Inject 1mg  intramuscularly (Patient not taking: Reported on 05/02/2019) 05/17/2018: Has if needed  . HUMALOG 100 UNIT/ML injection USE 300 UNITS IN INSULIN PUMP EVERY 48 HOURS. 11/30/2018: Keeps in case of pump failure  . insulin aspart (NOVOLOG) 100 UNIT/ML injection Use 300 units in insulin pump every 48 hours.   . Insulin Glargine (BASAGLAR KWIKPEN) 100 UNIT/ML SOPN Use up to 50 units daily. (Patient not taking: Reported on 05/02/2019) 05/18/2018: Has in case of pump failure  . Insulin Pen Needle 31G X 5 MM MISC Check Blood glucose 10x day   . loratadine (CLARITIN) 10 MG tablet Take 10 mg by mouth daily.   . Multiple Vitamin (MULTIVITAMIN) tablet Take 1 tablet by mouth daily.     Kathrynn Running. Norethin-Eth Estrad-Fe Biphas (LO LOESTRIN FE PO) Take by mouth.   . ondansetron (ZOFRAN-ODT) 4 MG disintegrating tablet Take 1 tablet (4 mg total) by mouth every 8 (eight) hours as needed for nausea or vomiting. (Patient not taking: Reported on 05/02/2019)    No facility-administered encounter medications on file as of 06/04/2019.     Allergies: No Known Allergies  Surgical History: Past Surgical History:  Procedure Laterality Date  . FRENULECTOMY, LINGUAL    . periodontal surgery      Family History:  Family History  Problem Relation Age of Onset  . Allergies Mother   . Thyroid disease Mother   . Thyroid disease Maternal Grandmother   . Parkinson's disease Maternal Grandmother    . Arthritis Maternal Grandfather   . Arthritis Paternal Grandmother   . Heart attack Paternal Grandfather   . Cancer Neg Hx       Social History: Lives with: Currently living with Parents.  [employer] for [employer].   Physical Exam:  There were no vitals filed for this visit. There were no vitals taken for this visit. Body mass index: body mass index is unknown because there is no height or weight on file. Growth percentile SmartLinks can only be used for patients less than 10248 years old.  Ht Readings from Last 3 Encounters:  05/02/19 5\' 4"  (1.626 m)  02/21/19 5\' 4"  (1.626 m)  11/30/18 5\' 4"  (1.626 m)   Wt Readings from Last 3 Encounters:  05/02/19 130 lb 12.8 oz (59.3 kg)  02/21/19 124 lb 6.4 oz (56.4 kg)  11/30/18 122 lb 9.6 oz (55.6 kg)    Physical Exam   General:  Well developed, well nourished female in no acute distress.  Alert and oriented.  Head: Normocephalic, atraumatic.   Eyes:  Pupils equal and round. EOMI.   Sclera white.  No eye drainage.   Ears/Nose/Mouth/Throat: Nares patent, no nasal drainage.  Normal dentition, mucous membranes moist.   Neck: supple, no cervical lymphadenopathy, no thyromegaly Cardiovascular: regular rate, normal S1/S2, no murmurs Respiratory: No increased work of breathing.   Skin: warm, dry.  No rash or lesions. Neurologic: alert and oriented, normal speech, no tremor   Labs:  Results for orders placed or performed in visit on 05/02/19  Microalbumin / creatinine urine ratio  Result Value Ref Range   Creatinine, Urine 236.7 Not Estab. mg/dL   Microalbumin, Urine 16.0 Not Estab. ug/mL   Microalb/Creat Ratio 7 0 - 29 mg/g creat  TSH  Result Value Ref Range   TSH 2.600 0.450 - 4.500 uIU/mL  VITAMIN D 25 Hydroxy (Vit-D Deficiency, Fractures)  Result Value Ref Range   Vit D, 25-Hydroxy 38.6 30.0 - 100.0 ng/mL  CBC with Differential/Platelet  Result Value Ref Range   WBC 7.7 3.4 - 10.8 x10E3/uL   RBC 4.94 3.77 - 5.28  x10E6/uL   Hemoglobin 14.5 11.1 - 15.9 g/dL   Hematocrit 44.4 34.0 - 46.6 %   MCV 90 79 - 97 fL   MCH 29.4 26.6 - 33.0 pg   MCHC 32.7 31.5 - 35.7 g/dL   RDW 11.8 11.7 - 15.4 %   Platelets 291 150 - 450 x10E3/uL   Neutrophils 53 Not Estab. %   Lymphs 34 Not Estab. %   Monocytes 7 Not Estab. %   Eos 4 Not Estab. %   Basos 1 Not Estab. %   Neutrophils Absolute 4.1 1.4 - 7.0 x10E3/uL   Lymphocytes Absolute 2.6 0.7 - 3.1 x10E3/uL   Monocytes Absolute 0.5 0.1 - 0.9 x10E3/uL   EOS (ABSOLUTE) 0.3 0.0 - 0.4 x10E3/uL   Basophils Absolute 0.1 0.0 - 0.2 x10E3/uL   Immature Granulocytes 1 Not Estab. %   Immature Grans (Abs) 0.0 0.0 - 0.1 x10E3/uL  Comprehensive metabolic panel  Result Value Ref Range   Glucose 192 (H) 65 - 99 mg/dL   BUN 11 6 - 20 mg/dL   Creatinine, Ser 0.79 0.57 - 1.00 mg/dL   GFR calc non Af Amer 107 >59 mL/min/1.73   GFR calc Af Amer 123 >59 mL/min/1.73   BUN/Creatinine Ratio 14 9 - 23   Sodium 136 134 - 144 mmol/L   Potassium 4.7 3.5 - 5.2 mmol/L   Chloride 97 96 - 106 mmol/L   CO2 22 20 - 29 mmol/L   Calcium 9.2 8.7 - 10.2 mg/dL   Total Protein 7.0 6.0 - 8.5 g/dL   Albumin 4.2 3.9 - 5.0 g/dL   Globulin, Total 2.8 1.5 - 4.5 g/dL   Albumin/Globulin Ratio 1.5 1.2 - 2.2   Bilirubin Total 0.3 0.0 - 1.2 mg/dL   Alkaline Phosphatase 57 39 - 117 IU/L   AST 17 0 - 40 IU/L   ALT 20 0 - 32 IU/L  Lipid panel  Result Value Ref Range   Cholesterol, Total 146 100 - 199 mg/dL   Triglycerides 83 0 - 149 mg/dL   HDL 73 >39 mg/dL   VLDL Cholesterol Cal 17 5 - 40 mg/dL   LDL Calculated 56 0 - 99 mg/dL   Chol/HDL Ratio 2.0 0.0 - 4.4 ratio   Hemoglobin A1c was 9.8% on 02/21/2019   Assessment/Plan: Marzella is a  23 y.o. female with type 1 diabetes in poor but improving control. Blood sugars continue to improve now that she is using Control IQ. She is relying heavily on basal insulin but is rarely having any hypoglycemia. Will not change pump settings at this time but may need  to reduce basal as she boluses more consistently.   1-3. DM w/o complication type I, uncontrolled (HCC)/Hyperglycemia/ Elevated A1c - Reviewed insulin pump and CGM download. Discussed trends and patterns.  - Rotate pump sites to prevent scar tissue.  - bolus 15 minutes prior to eating to limit blood sugar spikes.  - Reviewed carb counting and importance of accurate carb counting.  - Discussed signs and symptoms of hypoglycemia. Always have glucose available.  - POCT glucose and hemoglobin A1c  - Reviewed growth chart.    4. Insulin pump titration  Basal Rates 12AM 0.65  630Am  1.20  10am 1.80  9pm 1.35--> 1.25  11pm 1.25--> 1.15   5. Maladaptive health behaviors affecting medical condition - Discussed barriers to care and answered questions.  - Praise given for improvements.   Follow-up:  3 months.     When a patient is on insulin, intensive monitoring of blood glucose levels is necessary to avoid hyperglycemia and hypoglycemia. Severe hyperglycemia/hypoglycemia can lead to hospital admissions and be life threatening.    Gretchen ShortSpenser Milind Raether,  FNP-C  Pediatric Specialist  [address]  [place], [id number]  Tele: [phone]

## 2019-06-04 NOTE — Patient Instructions (Signed)
-  Always have fast sugar with you in case of low blood sugar (glucose tabs, regular juice or soda, candy) -Always wear your ID that states you have diabetes -Always bring your meter to your visit -Call/Email if you want to review blood sugars   

## 2019-06-05 ENCOUNTER — Other Ambulatory Visit (INDEPENDENT_AMBULATORY_CARE_PROVIDER_SITE_OTHER): Payer: Self-pay | Admitting: Family

## 2019-07-18 DIAGNOSIS — E1065 Type 1 diabetes mellitus with hyperglycemia: Secondary | ICD-10-CM | POA: Diagnosis not present

## 2019-07-27 ENCOUNTER — Other Ambulatory Visit: Payer: Self-pay | Admitting: Neurology

## 2019-07-27 DIAGNOSIS — R112 Nausea with vomiting, unspecified: Secondary | ICD-10-CM

## 2019-09-13 ENCOUNTER — Ambulatory Visit (INDEPENDENT_AMBULATORY_CARE_PROVIDER_SITE_OTHER): Payer: BC Managed Care – PPO | Admitting: Family

## 2019-10-16 DIAGNOSIS — E1065 Type 1 diabetes mellitus with hyperglycemia: Secondary | ICD-10-CM | POA: Diagnosis not present

## 2019-10-25 DIAGNOSIS — E109 Type 1 diabetes mellitus without complications: Secondary | ICD-10-CM | POA: Diagnosis not present

## 2019-10-25 LAB — HM DIABETES EYE EXAM

## 2019-10-26 ENCOUNTER — Encounter (INDEPENDENT_AMBULATORY_CARE_PROVIDER_SITE_OTHER): Payer: Self-pay | Admitting: Family

## 2019-10-26 ENCOUNTER — Ambulatory Visit (INDEPENDENT_AMBULATORY_CARE_PROVIDER_SITE_OTHER): Payer: BC Managed Care – PPO | Admitting: Family

## 2019-10-26 ENCOUNTER — Other Ambulatory Visit: Payer: Self-pay

## 2019-10-26 VITALS — BP 126/80 | HR 88 | Wt 134.2 lb

## 2019-10-26 DIAGNOSIS — F54 Psychological and behavioral factors associated with disorders or diseases classified elsewhere: Secondary | ICD-10-CM | POA: Diagnosis not present

## 2019-10-26 DIAGNOSIS — E1065 Type 1 diabetes mellitus with hyperglycemia: Secondary | ICD-10-CM

## 2019-10-26 DIAGNOSIS — R7309 Other abnormal glucose: Secondary | ICD-10-CM | POA: Diagnosis not present

## 2019-10-26 DIAGNOSIS — R739 Hyperglycemia, unspecified: Secondary | ICD-10-CM

## 2019-10-26 DIAGNOSIS — Z4681 Encounter for fitting and adjustment of insulin pump: Secondary | ICD-10-CM

## 2019-10-26 DIAGNOSIS — E10649 Type 1 diabetes mellitus with hypoglycemia without coma: Secondary | ICD-10-CM

## 2019-10-26 LAB — POCT URINALYSIS DIPSTICK: Ketones, UA: NEGATIVE

## 2019-10-26 LAB — POCT GLYCOSYLATED HEMOGLOBIN (HGB A1C): Hemoglobin A1C: 6.6 % — AB (ref 4.0–5.6)

## 2019-10-26 LAB — POCT GLUCOSE (DEVICE FOR HOME USE): POC Glucose: 357 mg/dl — AB (ref 70–99)

## 2019-10-26 NOTE — Progress Notes (Signed)
This is a Pediatric Specialist E-Visit follow up consult provided via , WebEx Paula Miller  consented to an E-Visit consult today.  Location of patient: Paula Miller is at [address] [place] [address] ([place] vacation at the beach)  Location of provider: Crist Infante  is at Pediatric Specialist office  Patient was referred by [name], MD   The following participants were involved in this E-Visit:Jaime Eldred, RMA Gretchen Short, FNP [name] patient  Chief Complain/ Reason for E-Visit today: Type 1 follow up  Total time on call: This call lasted >25 mintues. More then 50% of the visit was devoted to counseling.  Follow up: 3 months.     Pediatric Endocrinology Diabetes Consultation Follow-up Visit  Paula Miller 1996-06-25 [id number]  Chief Complaint: Follow-up type 1 diabetes   [name], MD   HPI: Paula Miller  is a 23 y.o. female presenting for follow-up of type 1 diabetes.  1. Paula Miller was diagnosed with new onset type 1 diabetes mellitus, dehydration, and ketonuria on 06/09/2005. She was initially treated with a multiple daily injection regimen of Lantus and Novolog insulins.  We converted her to a Medtronic insulin pump in November of 2007.  Although the patient does have a palpable goiter, she has remained euthyroid. Her CMPs, lipid panels, and urinary microalbumin to creatinine ratios have always been normal.   2. Since last visit to [facility] on 05/2019 , she has been well.  No ER visits or hospitalizations.   She recently got engaged, she is very excited about it. 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 She plans to move to [place] in September and is house shopping.   She is wearing Tslim insulin pump with Control IQ. She reports having frequent lows in the afternoon, usually after lunch and then again before dinner. She rarely enters carbs and boluses, she mainly relies on her pump to bolus for  her. She does feel her hypoglycemia sugars most of the time. She is very happy that her blood sugars have improved.    Insulin regimen: Tandem Tslim  Basal Rates 12AM 0.65  630Am  1.20  10am 1.80  9pm 1.25  11pm 1.15    Insulin to Carbohydrate Ratio 12AM  10  6am  8  11am 10  5pm 10       Insulin Sensitivity Factor 12AM  55   6am 37  10pm 55         Target Blood Glucose 12AM  150  6am 110  11pm 150          Hypoglycemia: Able to feel low blood sugars.  No glucagon needed recently.  Insulin Pump and CGM download:   - Avg Bg 167  - Target range; In target 63%, above target 36% and below target 1%   - Pattern of hypoglycemia between 2pm-5pm  - She is using 47 units per day. 75% basal and 25% bolus   - She has NOT entered any carbs for food bolus in the past month Med-alert ID: Not currently wearing. Injection sites: bilateral hips  Annual labs due: 04/2020 Ophthalmology due: 2019 December. Discussed importance of dilated eye exam.     3. ROS: Greater than 10 systems reviewed with pertinent positives listed in HPI, otherwise neg. Constitutional: Energy and appetite are good. Sleeping well.   Eyes: No changes in vision. Denies blurry vision. Wears glasses.  Ears/Nose/Mouth/Throat: No difficulty swallowing. No throat pain  Cardiovascular: No palpitations.  No tachycardia. No chest pain Respiratory: No increased work of breathing.  Gastrointestinal: No constipation or diarrhea.  Genitourinary: No nocturia, no polyuria Neurologic: Normal sensation, no tremor Endocrine: No polydipsia.  No hyperpigmentation Psychiatric: Normal affect. Denies anxiety and depression.   Past Medical History:   Past Medical History:  Diagnosis Date  . Allergy    seasonal allergies  . Goiter   . Hypoglycemia associated with diabetes ([place])   . Physical growth delay   . Type 1 diabetes mellitus in patient age 42-19 years with HbA1C goal below 7.5 diagnosed age 37  . VSD (ventricular  septal defect)     Medications:  Outpatient Encounter Medications as of 10/26/2019  Medication Sig Note  . butalbital-acetaminophen-caffeine (FIORICET) 50-325-40 MG tablet Take 1 tablet by mouth every 6 (six) hours as needed for headache. 1 tab q6h prn.  Call [phone] to schedule appt. 05/02/2019: Last took last week  . Calcium Carb-Cholecalciferol (CALCIUM-VITAMIN D) 500-200 MG-UNIT tablet Take 1 tablet by mouth daily. 04/28/2017: Unsure of dose. Target brand.  Takes one daily  . escitalopram (LEXAPRO) 10 MG tablet Take 10 mg by mouth daily. 06/04/2019: Takes 15 mg daily (1 1/2 tablet)   . glucagon 1 MG injection Use for Severe Hypoglycemia . Inject 1mg  intramuscularly 05/17/2018: Has if needed  . HUMALOG 100 UNIT/ML injection USE 300 UNITS IN INSULIN PUMP EVERY 48 HOURS. 11/30/2018: Keeps in case of pump failure  . insulin aspart (NOVOLOG) 100 UNIT/ML injection USE 300 UNITS IN INSULIN PUMP EVERY 48 HOURS.   . Insulin Glargine (BASAGLAR KWIKPEN) 100 UNIT/ML SOPN Use up to 50 units daily. 05/18/2018: Has in case of pump failure  . Insulin Pen Needle 31G X 5 MM MISC Check Blood glucose 10x day   . loratadine (CLARITIN) 10 MG tablet Take 10 mg by mouth daily.   . Multiple Vitamin (MULTIVITAMIN) tablet Take 1 tablet by mouth daily.     Cyndie Chime Estrad-Fe Biphas (LO LOESTRIN FE PO) Take by mouth.   . ondansetron (ZOFRAN-ODT) 4 MG disintegrating tablet TAKE 1 TABLET BY MOUTH EVERY 8 HOURS AS NEEDED FOR NAUSEA AND VOMITING   . Continuous Blood Gluc Sensor (D'Lo) MISC Use a sensor every 10 days NDC [phone] (Patient not taking: Reported on 10/26/2019) 05/01/2018: Had problems with skin sensitivity   No facility-administered encounter medications on file as of 10/26/2019.     Allergies: No Known Allergies  Surgical History: Past Surgical History:  Procedure Laterality Date  . FRENULECTOMY, LINGUAL    . periodontal surgery      Family History:  Family History   Problem Relation Age of Onset  . Allergies Mother   . Thyroid disease Mother   . Thyroid disease Maternal Grandmother   . Parkinson's disease Maternal Grandmother   . Arthritis Maternal Grandfather   . Arthritis Paternal Grandmother   . Heart attack Paternal Grandfather   . Cancer Neg Hx       Social History: Lives with: Currently living with Parents.  [employer] college for [employer].   Physical Exam:  Vitals:   10/26/19 1050  BP: 126/80  Pulse: 88  Weight: 134 lb 3.2 oz (60.9 kg)   BP 126/80   Pulse 88   Wt 134 lb 3.2 oz (60.9 kg)   BMI 23.04 kg/m  Body mass index: body mass index is 23.04 kg/m. Growth percentile SmartLinks can only be used for patients less than 52 years old.  Ht Readings from Last 3 Encounters:  05/02/19   (1.626 m)  02/21/19  (1.626 m)  11/30/18  (1.626 m)   Wt Readings from Last 3 Encounters:  10/26/19 134 lb 3.2 oz (60.9 kg)  05/02/19 130 lb 12.8 oz (59.3 kg)  02/21/19 124 lb 6.4 oz (56.4 kg)    Physical Exam   General: Well developed, well nourished female in no acute distress.  Alert and oriented.  Head: Normocephalic, atraumatic.   Eyes:  Pupils equal and round. EOMI.   Sclera white.  No eye drainage.   Ears/Nose/Mouth/Throat: Nares patent, no nasal drainage.  Normal dentition, mucous membranes moist.   Neck: supple, no cervical lymphadenopathy, no thyromegaly Cardiovascular: regular rate, normal S1/S2, no murmurs Respiratory: No increased work of breathing.  Lungs clear to auscultation bilaterally.  No wheezes. Abdomen: soft, nontender, nondistended. Normal bowel sounds.  No appreciable masses  Extremities: warm, well perfused, cap refill < 2 sec.   Musculoskeletal: Normal muscle mass.  Normal strength Skin: warm, dry.  No rash or lesions. Neurologic: alert and oriented, normal speech, no tremor   Labs:  Results for orders placed or performed in visit on 10/26/19  POCT Glucose (Device for Home Use)  Result  Value Ref Range   Glucose Fasting, POC     POC Glucose 357 (A) 70 - 99 mg/dl  POCT HgB Z6X  Result Value Ref Range   Hemoglobin A1C 6.6 (A) 4.0 - 5.6 %   HbA1c POC (<> result, manual entry)     HbA1c, POC (prediabetic range)     HbA1c, POC (controlled diabetic range)    POCT Urinalysis Dipstick  Result Value Ref Range   Color, UA     Clarity, UA     Glucose, UA     Bilirubin, UA     Ketones, UA neg    Spec Grav, UA     Blood, UA     pH, UA     Protein, UA     Urobilinogen, UA     Nitrite, UA     Leukocytes, UA     Appearance     Odor      Hemoglobin A1c was 9.8% on 02/21/2019   Assessment/Plan: Persia is a 23 y.o. female with type 1 diabetes in poor but improving control. She is relying heavily on the Contorl IQ insulin pump technology. Her blood sugars and hemoglobin A1c have improved but she is not entering her carbs. She is also having a pattern of hypoglycemia in the afternoon. Hemoglobin A1c is 6.6% which meets the ADA goal of <7%. She needs her afternoon basal rate and correction factor reduced.   1-3. DM w/o complication type I, uncontrolled (HCC)/Hyperglycemia/ Elevated A1c - Reviewed insulin pump and CGM download. Discussed trends and patterns.  - Rotate pump sites to prevent scar tissue.  - bolus 15 minutes prior to eating to limit blood sugar spikes.  - Reviewed carb counting and importance of accurate carb counting.  - Discussed signs and symptoms of hypoglycemia. Always have glucose available.  - POCT glucose and hemoglobin A1c  - Reviewed growth chart.  - Encouraged her to bolus for her carbs and advised that the pump only gives a 60% bolus of needed insulin when she is hyperglycemic.   4. Insulin pump titration  Basal Rates 12AM 0.65  630Am  1.20  10am 1.80--> 1.70  9pm 1.25  11pm 1.150      Insulin Sensitivity Factor 12AM  55   6am 37--> 45  10pm 55  Insulin to Carbohydrate Ratio 12AM  10  6am  8--> 10   11am 10  5pm 10         5. Maladaptive health behaviors affecting medical condition - Discussed barriers to care and answered questions.  - Discussed transition to adult care. Since she will be moving soon, will keep her at practice to help with continuity of care.   Follow-up:  3 months.     I have spent >25 minutes with >50% of time in counseling, education and instruction. When a patient is on insulin, intensive monitoring of blood glucose levels is necessary to avoid hyperglycemia and hypoglycemia. Severe hyperglycemia/hypoglycemia can lead to hospital admissions and be life threatening.   Gretchen ShortSpenser Ravon Mortellaro,  FNP-C  Pediatric Specialist  [address]  [place], [id number]  Tele: [phone]

## 2019-10-26 NOTE — Patient Instructions (Addendum)
Basal Rates 12AM 0.65  630Am  1.20  10am 1.80--> 1.70  9pm 1.25  11pm 1.150      Insulin Sensitivity Factor 12AM  55   6am 37--> 45  10pm 55         - At breakfast, enter 15 grams for coffee and bolus  - A1c is 6.6%

## 2019-11-08 ENCOUNTER — Other Ambulatory Visit (INDEPENDENT_AMBULATORY_CARE_PROVIDER_SITE_OTHER): Payer: Self-pay | Admitting: Family

## 2020-01-17 DIAGNOSIS — E1065 Type 1 diabetes mellitus with hyperglycemia: Secondary | ICD-10-CM | POA: Diagnosis not present

## 2020-01-24 ENCOUNTER — Other Ambulatory Visit (INDEPENDENT_AMBULATORY_CARE_PROVIDER_SITE_OTHER): Payer: Self-pay | Admitting: Family

## 2020-01-25 ENCOUNTER — Encounter (INDEPENDENT_AMBULATORY_CARE_PROVIDER_SITE_OTHER): Payer: Self-pay | Admitting: Family

## 2020-01-25 ENCOUNTER — Other Ambulatory Visit: Payer: Self-pay

## 2020-01-25 ENCOUNTER — Ambulatory Visit (INDEPENDENT_AMBULATORY_CARE_PROVIDER_SITE_OTHER): Payer: BC Managed Care – PPO | Admitting: Family

## 2020-01-25 VITALS — BP 122/80 | HR 98 | Wt 134.4 lb

## 2020-01-25 DIAGNOSIS — R739 Hyperglycemia, unspecified: Secondary | ICD-10-CM

## 2020-01-25 DIAGNOSIS — Z4681 Encounter for fitting and adjustment of insulin pump: Secondary | ICD-10-CM | POA: Diagnosis not present

## 2020-01-25 DIAGNOSIS — E1065 Type 1 diabetes mellitus with hyperglycemia: Secondary | ICD-10-CM

## 2020-01-25 DIAGNOSIS — E10649 Type 1 diabetes mellitus with hypoglycemia without coma: Secondary | ICD-10-CM

## 2020-01-25 DIAGNOSIS — F54 Psychological and behavioral factors associated with disorders or diseases classified elsewhere: Secondary | ICD-10-CM | POA: Diagnosis not present

## 2020-01-25 LAB — POCT GLUCOSE (DEVICE FOR HOME USE): POC Glucose: 126 mg/dl — AB (ref 70–99)

## 2020-01-25 LAB — POCT GLYCOSYLATED HEMOGLOBIN (HGB A1C): Hemoglobin A1C: 6.6 % — AB (ref 4.0–5.6)

## 2020-01-25 MED ORDER — BAQSIMI ONE PACK 3 MG/DOSE NA POWD: 1.0000 | NASAL | 1 refills | Status: AC | PRN

## 2020-01-25 MED ORDER — BASAGLAR KWIKPEN 100 UNIT/ML ~~LOC~~ SOPN: PEN_INJECTOR | SUBCUTANEOUS | 1 refills | Status: DC

## 2020-01-25 MED ORDER — INSULIN LISPRO (1 UNIT DIAL) 100 UNIT/ML (KWIKPEN): PEN_INJECTOR | SUBCUTANEOUS | 5 refills | Status: DC

## 2020-01-25 NOTE — Progress Notes (Signed)
Pediatric Endocrinology Diabetes Consultation Follow-up Visit  Paula Miller [birth year] [id number]  Chief Complaint: Follow-up type 1 diabetes   Paula Arrow, MD   HPI: Paula Miller  is a 24 y.o. female presenting for follow-up of type 1 diabetes.  1. Paula Miller was diagnosed with new onset type 1 diabetes mellitus, dehydration, and ketonuria on 06/09/2005. She was initially treated with a multiple daily injection regimen of Lantus and Novolog insulins.  We converted her to a Medtronic insulin pump in November of 2007.  Although the patient does have a palpable goiter, she has remained euthyroid. Her CMPs, lipid panels, and urinary microalbumin to creatinine ratios have always been normal.   2. Since last visit to [facility] on 10/2019 , she has been well.  No ER visits or hospitalizations.   She recently closed on a house in [place] and will be getting married in September. She will graduate in May with her Masters. Using Tslim insulin pump with Dexcom CGM. She has control IQ on her insulin pump.  Concerns:  - Issues with sensors dropping signal for an hour or two at a time  - Will have sensor with poor code.  - Occasionally goes low before lunch.   Insulin regimen: Tandem Tslim  Basal Rates 12AM 0.65  630Am  1.20  10am 1.70  9pm 1.25  11pm 1.15    Insulin to Carbohydrate Ratio 12AM  10  6am  10  11am 10  5pm 10       Insulin Sensitivity Factor 12AM  55   6am 45  10pm 55         Target Blood Glucose 12AM  150  6am 110  11pm 150          Hypoglycemia: Able to feel low blood sugars.  No glucagon needed recently.  Insulin Pump and CGM download:   Med-alert ID: Not currently wearing. Injection sites: bilateral hips  Annual labs due: 04/2020 Ophthalmology due: 2019 December. Discussed importance of dilated eye exam.     3. ROS: Greater than 10 systems reviewed with pertinent positives listed in HPI, otherwise neg. Constitutional: Weight stable. Sleeping well.    Eyes: No changes in vision. Denies blurry vision. Wears glasses.  Ears/Nose/Mouth/Throat: No difficulty swallowing. No throat pain  Cardiovascular: No palpitations. No tachycardia. No chest pain Respiratory: No increased work of breathing.  Gastrointestinal: No constipation or diarrhea.  Genitourinary: No nocturia, no polyuria Neurologic: Normal sensation, no tremor Endocrine: No polydipsia.  No hyperpigmentation Psychiatric: Normal affect. Denies anxiety and depression.   Past Medical History:   Past Medical History:  Diagnosis Date  . Allergy    seasonal allergies  . Goiter   . Hypoglycemia associated with diabetes (HCC)   . Physical growth delay   . Type 1 diabetes mellitus in patient age 44-19 years with HbA1C goal below 7.5 diagnosed age 71  . VSD (ventricular septal defect)     Medications:  Outpatient Encounter Medications as of 01/25/2020  Medication Sig Note  . butalbital-acetaminophen-caffeine (FIORICET) 50-325-40 MG tablet Take 1 tablet by mouth every 6 (six) hours as needed for headache. 1 tab q6h prn.  Call [phone] to schedule appt. 05/02/2019: Last took last week  . Calcium Carb-Cholecalciferol (CALCIUM-VITAMIN D) 500-200 MG-UNIT tablet Take 1 tablet by mouth daily. 04/28/2017: Unsure of dose. Target brand.  Takes one daily  . escitalopram (LEXAPRO) 10 MG tablet Take 10 mg by mouth daily. 06/04/2019: Takes 15 mg daily (1 1/2 tablet)   . HUMALOG 100 UNIT/ML injection USE  300 UNITS IN INSULIN PUMP EVERY 48 HOURS. 11/30/2018: Keeps in case of pump failure  . insulin aspart (NOVOLOG) 100 UNIT/ML injection USE 300 UNITS IN INSULIN PUMP EVERY 48 HOURS.   . Insulin Pen Needle 31G X 5 MM MISC Check Blood glucose 10x day   . loratadine (CLARITIN) 10 MG tablet Take 10 mg by mouth daily.   . Multiple Vitamin (MULTIVITAMIN) tablet Take 1 tablet by mouth daily.     Paula Miller Estrad-Fe Biphas (LO LOESTRIN FE PO) Take by mouth.   . [DISCONTINUED] glucagon 1 MG injection Use for Severe  Hypoglycemia . Inject 1mg  intramuscularly 05/17/2018: Has if needed  . Continuous Blood Gluc Sensor (FREESTYLE LIBRE SENSOR SYSTEM) MISC Use a sensor every 10 days NDC [phone] (Patient not taking: Reported on 10/26/2019) 05/01/2018: Had problems with skin sensitivity  . Glucagon (BAQSIMI ONE PACK) 3 MG/DOSE POWD Place 1 Dose into the nose as needed.   . Insulin Glargine (BASAGLAR KWIKPEN) 100 UNIT/ML Inject up to 50 units per day if insulin pump fails.   . insulin lispro (HUMALOG KWIKPEN) 100 UNIT/ML KwikPen USE AS DIRECTED IF PUMP FAILS   . ondansetron (ZOFRAN-ODT) 4 MG disintegrating tablet TAKE 1 TABLET BY MOUTH EVERY 8 HOURS AS NEEDED FOR NAUSEA AND VOMITING (Patient not taking: Reported on 01/25/2020)   . [DISCONTINUED] HUMALOG KWIKPEN 100 UNIT/ML KwikPen USE AS DIRECTED IF PUMP FAILS (Patient not taking: Reported on 01/25/2020)   . [DISCONTINUED] Insulin Glargine (BASAGLAR KWIKPEN) 100 UNIT/ML USE UP TO 50 UNITS DAILY. (Patient not taking: Reported on 01/25/2020)    No facility-administered encounter medications on file as of 01/25/2020.    Allergies: No Known Allergies  Surgical History: Past Surgical History:  Procedure Laterality Date  . FRENULECTOMY, LINGUAL    . periodontal surgery      Family History:  Family History  Problem Relation Age of Onset  . Allergies Mother   . Thyroid disease Mother   . Thyroid disease Maternal Grandmother   . Parkinson's disease Maternal Grandmother   . Arthritis Maternal Grandfather   . Arthritis Paternal Grandmother   . Heart attack Paternal Grandfather   . Cancer Neg Hx       Social History: Lives with: Currently living with Parents.  03/26/2020 college for [employer]   Physical Exam:  Vitals:   01/25/20 0924  BP: 122/80  Pulse: 98  Weight: 134 lb 6.4 oz (61 kg)   BP 122/80   Pulse 98   Wt 134 lb 6.4 oz (61 kg)   BMI 23.07 kg/m  Body mass index: body mass index is 23.07 kg/m. Growth percentile SmartLinks can only be used  for patients less than 20 years old.  Ht Readings from Last 3 Encounters:  05/02/19 5\' 4"  (1.626 m)  02/21/19 5\' 4"  (1.626 m)  11/30/18 5\' 4"  (1.626 m)   Wt Readings from Last 3 Encounters:  01/25/20 134 lb 6.4 oz (61 kg)  10/26/19 134 lb 3.2 oz (60.9 kg)  05/02/19 130 lb 12.8 oz (59.3 kg)    General: Well developed, well nourished female in no acute distress.  Alert and oriented.  Head: Normocephalic, atraumatic.   Eyes:  Pupils equal and round. EOMI.   Sclera white.  No eye drainage.   Ears/Nose/Mouth/Throat: Nares patent, no nasal drainage.  Normal dentition, mucous membranes moist.   Neck: supple, no cervical lymphadenopathy, no thyromegaly Cardiovascular: regular rate, normal S1/S2, no murmurs Respiratory: No increased work of breathing.  Lungs clear to auscultation bilaterally.  No wheezes. Abdomen: soft, nontender, nondistended. Normal bowel sounds.  No appreciable masses  Extremities: warm, well perfused, cap refill < 2 sec.   Musculoskeletal: Normal muscle mass.  Normal strength Skin: warm, dry.  No rash or lesions. Neurologic: alert and oriented, normal speech, no tremor   Labs:  Results for orders placed or performed in visit on 01/25/20  POCT glycosylated hemoglobin (Hb A1C)  Result Value Ref Range   Hemoglobin A1C 6.6 (A) 4.0 - 5.6 %   HbA1c POC (<> result, manual entry)     HbA1c, POC (prediabetic range)     HbA1c, POC (controlled diabetic range)    POCT Glucose (Device for Home Use)  Result Value Ref Range   Glucose Fasting, POC     POC Glucose 126 (A) 70 - 99 mg/dl    Hemoglobin A1c was 6.6% on 10/2019   Assessment/Plan: Arlen is a 24 y.o. female with type 1 diabetes in poor but improving control. She is having a blood sugar drop around 12pm-2pm. Will reduce basal rate at this time. She is not bolusing but relying on Tslim insulin pump to do all bolusing. Hemoglobin A1c is 6.6% which meets ADA goal of <7%.   1-3. DM w/o complication type I,  uncontrolled (HCC)/Hyperglycemia/ Elevated A1c - Reviewed insulin pump and CGM download. Discussed trends and patterns.  - Rotate pump sites to prevent scar tissue.  - bolus 15 minutes prior to eating to limit blood sugar spikes.  - Reviewed carb counting and importance of accurate carb counting.  - Discussed signs and symptoms of hypoglycemia. Always have glucose available.  - POCT glucose and hemoglobin A1c  - Reviewed growth chart.   4. Insulin pump titration   Basal Rates 12AM 0.65  630Am  1.20  10am 1.70--> 1.63  9pm 1.25  11pm 1.15    5. Maladaptive health behaviors affecting medical condition -Discussed concerns.  - Discussed transition to adult endocrinology care  - Encouraged to bolus when she eats.  - Praise given for improvements.  - Answered questions.   Follow-up:  3 months.    >45  spent today reviewing the medical chart, counseling the patient/family, and documenting today's visit.  When a patient is on insulin, intensive monitoring of blood glucose levels is necessary to avoid hyperglycemia and hypoglycemia. Severe hyperglycemia/hypoglycemia can lead to hospital admissions and be life threatening.   Hermenia Bers,  FNP-C  Pediatric Specialist  [address] [place]  [place], [address]  Tele: [phone]

## 2020-01-25 NOTE — Patient Instructions (Signed)
-  Always have fast sugar with you in case of low blood sugar (glucose tabs, regular juice or soda, candy) -Always wear your ID that states you have diabetes -Always bring your meter to your visit -Call/Email if you want to review blood sugars   

## 2020-01-30 DIAGNOSIS — R1032 Left lower quadrant pain: Secondary | ICD-10-CM | POA: Diagnosis not present

## 2020-03-24 DIAGNOSIS — E109 Type 1 diabetes mellitus without complications: Secondary | ICD-10-CM | POA: Diagnosis not present

## 2020-04-11 DIAGNOSIS — E109 Type 1 diabetes mellitus without complications: Secondary | ICD-10-CM | POA: Diagnosis not present

## 2020-04-29 ENCOUNTER — Ambulatory Visit (INDEPENDENT_AMBULATORY_CARE_PROVIDER_SITE_OTHER): Payer: BC Managed Care – PPO | Admitting: Family

## 2020-04-29 ENCOUNTER — Other Ambulatory Visit (INDEPENDENT_AMBULATORY_CARE_PROVIDER_SITE_OTHER): Payer: Self-pay

## 2020-04-29 ENCOUNTER — Other Ambulatory Visit: Payer: Self-pay

## 2020-04-29 ENCOUNTER — Encounter (INDEPENDENT_AMBULATORY_CARE_PROVIDER_SITE_OTHER): Payer: Self-pay | Admitting: Family

## 2020-04-29 VITALS — BP 118/74 | HR 82 | Wt 135.6 lb

## 2020-04-29 DIAGNOSIS — E10649 Type 1 diabetes mellitus with hypoglycemia without coma: Secondary | ICD-10-CM | POA: Diagnosis not present

## 2020-04-29 DIAGNOSIS — Z4681 Encounter for fitting and adjustment of insulin pump: Secondary | ICD-10-CM | POA: Diagnosis not present

## 2020-04-29 DIAGNOSIS — E1065 Type 1 diabetes mellitus with hyperglycemia: Secondary | ICD-10-CM | POA: Diagnosis not present

## 2020-04-29 DIAGNOSIS — R739 Hyperglycemia, unspecified: Secondary | ICD-10-CM

## 2020-04-29 DIAGNOSIS — F54 Psychological and behavioral factors associated with disorders or diseases classified elsewhere: Secondary | ICD-10-CM

## 2020-04-29 LAB — POCT GLYCOSYLATED HEMOGLOBIN (HGB A1C): Hemoglobin A1C: 6.7 % — AB (ref 4.0–5.6)

## 2020-04-29 LAB — POCT GLUCOSE (DEVICE FOR HOME USE): POC Glucose: 176 mg/dl — AB (ref 70–99)

## 2020-04-29 MED ORDER — INSULIN LISPRO (1 UNIT DIAL) 100 UNIT/ML (KWIKPEN): PEN_INJECTOR | SUBCUTANEOUS | 0 refills | Status: AC

## 2020-04-29 MED ORDER — BASAGLAR KWIKPEN 100 UNIT/ML ~~LOC~~ SOPN: PEN_INJECTOR | SUBCUTANEOUS | 1 refills | Status: DC

## 2020-04-29 NOTE — Progress Notes (Signed)
Pediatric Endocrinology Diabetes Consultation Follow-up Visit  Paula Miller 1996-04-18 [id number]  Chief Complaint: Follow-up type 1 diabetes   [name], MD   HPI: Paula Miller  is a 24 y.o. female presenting for follow-up of type 1 diabetes.  1. Paula Miller was diagnosed with new onset type 1 diabetes mellitus, dehydration, and ketonuria on 06/09/2005. She was initially treated with a multiple daily injection regimen of Lantus and Novolog insulins.  We converted her to a Medtronic insulin pump in November of 2007.  Although the patient does have a palpable goiter, she has remained euthyroid. Her CMPs, lipid panels, and urinary microalbumin to creatinine ratios have always been normal.   2. Since last visit to [facility] on 01/2020 , she has been well.  No ER visits or hospitalizations.   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 She is also moving her stuff to her new house in [place]. She is using TSlim insulin pump and dexcom CGM.   Concerns.  - Hypoglycemia between 9-11pm.  - Also feels like she has to drink sugar around 5 pm to prevent low blood sugar  - She is still not bolusing.    Insulin regimen: Tandem Tslim  Basal Rates 12AM 0.65  630Am  1.20  10am 11am 1.67 1.70  9pm 1.25  11pm 1.15    Insulin to Carbohydrate Ratio 12AM  10  6am  10  11am 10  5pm 10       Insulin Sensitivity Factor 12AM  55   6am 45  10pm 55         Target Blood Glucose 12AM  150  6am 110  11pm 150          Hypoglycemia: Able to feel low blood sugars.  No glucagon needed recently.  Insulin Pump and CGM download:   Med-alert ID: Not currently wearing. Injection sites: bilateral hips  Annual labs due: 04/2020 Ophthalmology due: 2019 December. Discussed importance of dilated eye exam.     3. ROS: Greater than 10 systems reviewed with pertinent positives listed in HPI, otherwise neg. Constitutional: Weight stable. Sleeping well.  Eyes:  No changes in vision. Denies blurry vision. Wears glasses.  Ears/Nose/Mouth/Throat: No difficulty swallowing. No throat pain  Cardiovascular: No palpitations. No tachycardia. No chest pain Respiratory: No increased work of breathing.  Gastrointestinal: No constipation or diarrhea.  Genitourinary: No nocturia, no polyuria Neurologic: Normal sensation, no tremor Endocrine: No polydipsia.  No hyperpigmentation Psychiatric: Normal affect. Denies anxiety and depression.   Past Medical History:   Past Medical History:  Diagnosis Date  . Allergy    seasonal allergies  . Goiter   . Hypoglycemia associated with diabetes (HCC)   . Physical growth delay   . Type 1 diabetes mellitus in patient age 82-19 years with HbA1C goal below 7.5 diagnosed age 20  . VSD (ventricular septal defect)     Medications:  Outpatient Encounter Medications as of 04/29/2020  Medication Sig Note  . insulin aspart (NOVOLOG) 100 UNIT/ML injection USE 300 UNITS IN INSULIN PUMP EVERY 48 HOURS.   Marland Kitchen loratadine (CLARITIN) 10 MG tablet Take 10 mg by mouth daily.   . Multiple Vitamin (MULTIVITAMIN) tablet Take 1 tablet by mouth daily.     Kathrynn Running Estrad-Fe Biphas (LO LOESTRIN FE PO) Take by mouth.   . butalbital-acetaminophen-caffeine (FIORICET) 50-325-40 MG tablet Take 1 tablet by mouth every 6 (six) hours as needed for headache. 1 tab q6h prn.  Call [phone] to schedule  appt. (Patient not taking: Reported on 04/29/2020) 05/02/2019: Last took last week  . Calcium Carb-Cholecalciferol (CALCIUM-VITAMIN D) 500-200 MG-UNIT tablet Take 1 tablet by mouth daily. 04/28/2017: Unsure of dose. Target brand.  Takes one daily  . Continuous Blood Gluc Sensor ([place]) MISC Use a sensor every 10 days NDC [phone] (Patient not taking: Reported on 10/26/2019) 05/01/2018: Had problems with skin sensitivity  . escitalopram (LEXAPRO) 10 MG tablet Take 10 mg by mouth daily. 06/04/2019: Takes 15 mg daily (1 1/2 tablet)   .  Glucagon (BAQSIMI ONE PACK) 3 MG/DOSE POWD Place 1 Dose into the nose as needed. (Patient not taking: Reported on 04/29/2020)   . HUMALOG 100 UNIT/ML injection USE 300 UNITS IN INSULIN PUMP EVERY 48 HOURS. 11/30/2018: Keeps in case of pump failure  . Insulin Pen Needle 31G X 5 MM MISC Check Blood glucose 10x day (Patient not taking: Reported on 04/29/2020)   . ondansetron (ZOFRAN-ODT) 4 MG disintegrating tablet TAKE 1 TABLET BY MOUTH EVERY 8 HOURS AS NEEDED FOR NAUSEA AND VOMITING (Patient not taking: Reported on 01/25/2020)   . [DISCONTINUED] Insulin Glargine (BASAGLAR KWIKPEN) 100 UNIT/ML Inject up to 50 units per day if insulin pump fails.   . [DISCONTINUED] insulin lispro (HUMALOG KWIKPEN) 100 UNIT/ML KwikPen USE AS DIRECTED IF PUMP FAILS    No facility-administered encounter medications on file as of 04/29/2020.    Allergies: No Known Allergies  Surgical History: Past Surgical History:  Procedure Laterality Date  . FRENULECTOMY, LINGUAL    . periodontal surgery      Family History:  Family History  Problem Relation Age of Onset  . Allergies Mother   . Thyroid disease Mother   . Thyroid disease Maternal Grandmother   . Parkinson's disease Maternal Grandmother   . Arthritis Maternal Grandfather   . Arthritis Paternal Grandmother   . Heart attack Paternal Grandfather   . Cancer Neg Hx       Social History: Lives with: Currently living with Parents.  [employer] college for [employer].   Physical Exam:  Vitals:   04/29/20 0837  BP: 118/74  Pulse: 82  Weight: 135 lb 9.6 oz (61.5 kg)   BP 118/74   Pulse 82   Wt 135 lb 9.6 oz (61.5 kg)   BMI 23.28 kg/m  Body mass index: body mass index is 23.28 kg/m. Growth percentile SmartLinks can only be used for patients less than 42 years old.  Ht Readings from Last 3 Encounters:  05/02/19 5\' 4"  (1.626 m)  02/21/19 5\' 4"  (1.626 m)  11/30/18 5\' 4"  (1.626 m)   Wt Readings from Last 3 Encounters:  04/29/20 135 lb 9.6 oz (61.5 kg)    01/25/20 134 lb 6.4 oz (61 kg)  10/26/19 134 lb 3.2 oz (60.9 kg)   General: Well developed, well nourished female in no acute distress.   Head: Normocephalic, atraumatic.   Eyes:  Pupils equal and round. EOMI.   Sclera white.  No eye drainage.   Ears/Nose/Mouth/Throat: Nares patent, no nasal drainage.  Normal dentition, mucous membranes moist.   Neck: supple, no cervical lymphadenopathy, no thyromegaly Cardiovascular: regular rate, normal S1/S2, no murmurs Respiratory: No increased work of breathing.  Lungs clear to auscultation bilaterally.  No wheezes. Abdomen: soft, nontender, nondistended. Normal bowel sounds.  No appreciable masses  Extremities: warm, well perfused, cap refill < 2 sec.   Musculoskeletal: Normal muscle mass.  Normal strength Skin: warm, dry.  No rash or lesions. Neurologic: alert and oriented, normal  speech, no tremor    Labs:  Results for orders placed or performed in visit on 04/29/20  POCT glycosylated hemoglobin (Hb A1C)  Result Value Ref Range   Hemoglobin A1C 6.7 (A) 4.0 - 5.6 %   HbA1c POC (<> result, manual entry)     HbA1c, POC (prediabetic range)     HbA1c, POC (controlled diabetic range)    POCT Glucose (Device for Home Use)  Result Value Ref Range   Glucose Fasting, POC     POC Glucose 176 (A) 70 - 99 mg/dl    Hemoglobin N4O was 2.7% on 01/2020  Assessment/Plan: Tashae is a 24 y.o. female with type 1 diabetes in poor but improving control. Pattern of hypoglycemia between 8-11pm, will reduce basal rate. She is rarely bolusing and relying on pump to do auto corrections. Hemoglobin A1c is 6.7% which meets the ADA goal of <7.5%.   1-3. DM w/o complication type I, uncontrolled (HCC)/Hyperglycemia/ Elevated A1c - Reviewed insulin pump and CGM download. Discussed trends and patterns.  - Rotate pump sites to prevent scar tissue.  - bolus 15 minutes prior to eating to limit blood sugar spikes.  - Reviewed carb counting and importance of accurate  carb counting.  - Discussed signs and symptoms of hypoglycemia. Always have glucose available.  - POCT glucose and hemoglobin A1c  - Reviewed growth chart.   4. Insulin pump titration   Basal Rates Basal Rates 12AM 0.65  630Am  1.20  10am 11am  1.67 1.70-->1.65  9pm 1.25--> 1.20   11pm 1.15     Insulin Sensitivity Factor 12AM  55   6am 45  9pm 37--> 42   11pm 55      5. Maladaptive health behaviors affecting medical condition - Discussed concerns and barriers to care.  - She plans to transition to adult care when she moves following her wedding.    Follow-up:  3 months.    >45spent today reviewing the medical chart, counseling the patient/family, and documenting today's visit.   When a patient is on insulin, intensive monitoring of blood glucose levels is necessary to avoid hyperglycemia and hypoglycemia. Severe hyperglycemia/hypoglycemia can lead to hospital admissions and be life threatening.   Gretchen Short,  FNP-C  Pediatric Specialist  [address]  [place], [address]  Tele: [phone]

## 2020-05-05 ENCOUNTER — Ambulatory Visit (INDEPENDENT_AMBULATORY_CARE_PROVIDER_SITE_OTHER): Payer: BC Managed Care – PPO | Admitting: Family

## 2020-05-06 NOTE — Progress Notes (Signed)
Chief Complaint  Patient presents with   Annual Exam    fasting annual, no pap-sees Dr.Grewel. Also sees [facility]. No new concerns.     Paula Miller is a 24 y.o. female who presents for a complete physical.   She is engaged, will be getting married in September, and moving to [place]. Her fiance is getting his Ph.D. in nuclear physics there. She recently completed her masters program at [employer], in [place]. She does not yet have a job in [place].  She has been having long-term issues with congestion (months). Mucus is clear, feels drainage into her throat and on either side of her nose.  Denies purulent drainage.  Denies sniffling, sneezing, eye symptoms.  Sinuses are typically all affected (forehead and cheeks feel pressure). Denies cough. She has history of allergies. She continues to use Flonase and Claritin daily.  Changes in environment--living with 4 dogs  Anxiety--she has been treated by her GYN with Lexapro. She is compliant in taking it every evening, finds it helpful, and denies side effects. Currently taking 15 mg daily.  She has appt with GYN next week.  Vitamin D deficiency:  Last check was normal in 04/2019, with level of 38.6  At that time she was taking Calcium with D once daily, plus MVI. She continues on the same supplements.  Diabetes:Type1 diabetes- She is followed by peds endo, last seen earlier this month.  She is using TSlim insulin pump and dexcom CGM. She had reported somehypoglycemia between 8-11pm at that visit, basal rate was reduced. Can't tell if she sees a difference yet. She is rarely bolusing and relying on pump to do auto corrections. Hemoglobin A1c was 6.7% She checks feet regularly, denies concerns. Denies numbness, tinglng, burning in feet.  Sees ophtho once yearly--last exam 10/2019 (report not received, last note in chart was 10/2018), no retinopathy.  Chronic migraines without aura: last saw neuro in  04/2018.She tapered off thetopamax since that time, and continues to do well, with infrequent migraines after getting daith ear piercing (03/2018) and starting Lexapro.. She uses butalbtal prn. Hasn't had a bad migraine in a while. Excedrin Migraine usually works well (advil doesn't).  Last used butalbital a couple of months ago, and worked well. Sometimes triggered by eye strain from lighting, some smells, and her allergies/sinuses. She is asking for refill to have on hand, since she is moving.  Still has about 14-15 left in current bottle.  Sees GYN, Dr. Helane Rima, had normal pap last year, scheduled for next week.She originally started on OCPs forheavy cycles. She currently also uses for contraception, but reports also using condoms. Cycles are normal, no irregular bleeding. Had some heavy bleeding a few months ago, saw GYN.  No further heavy bleeding. Patient's mother did not want her to get HPV vaccination, and she is still not interested  Immunization History  Administered Date(s) Administered   DTaP 10/23/1996, 12/25/1996, 02/26/1997, 03/12/1998, 05/10/2001   Hepatitis A 01/24/2006, 03/10/2007   Hepatitis B 10/23/1996, 01/04/1997, 06/09/1997   HiB (PRP-OMP) 10/23/1996, 12/25/1996, 02/26/1997, 12/11/1997   IPV 10/23/1996, 12/25/1996, 03/12/1998, 05/10/2001   Influenza Split 08/22/2016   Influenza, Seasonal, Injecte, Preservative Fre 08/31/2014   Influenza,inj,Quad PF,6+ Mos 08/08/2013, 09/09/2015, 08/05/2017, 08/31/2018, 09/11/2019   Influenza-Unspecified 09/19/2002, 09/15/2005, 09/13/2008, 07/15/2011   MMR 12/11/1997, 05/10/2001   Meningococcal B, OMV 05/07/2016, 06/23/2016   Meningococcal Polysaccharide 02/13/2009, 05/24/2014   PFIZER SARS-COV-2 Vaccination 02/16/2020, 03/12/2020   PPD Test 05/01/2018   Pneumococcal Conjugate-13 05/02/2019   Td 04/28/2017  Tdap 05/10/2007   Varicella 09/16/1997   Declines HPV series. NCIR was previously reviewed, and Varicella  was marked as "immune"  She reports she had varicella infection as a child. Last Pap smear:  04/2019 with Dr. Helane Rima, normal. Dentist: twice yearly Ophtho: yearly, last 10/2019 per pt Exercise: walking dog a mile or 2 daily, most days (active dog, gets cardio).  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 04/28/2017: Unsure of dose. Target brand.  Takes one daily   Continuous Blood Gluc Sensor (DEXCOM G6 SENSOR) MISC by Does not apply route.    escitalopram (LEXAPRO) 10 MG tablet Take 10 mg by mouth daily. 06/04/2019: Takes 15 mg daily (1 1/2 tablet)    fluticasone (FLONASE) 50 MCG/ACT nasal spray Place 2 sprays into both nostrils daily.    insulin aspart (NOVOLOG) 100 UNIT/ML injection USE 300 UNITS IN INSULIN PUMP EVERY 48 HOURS.    Insulin Pen Needle 31G X 5 MM MISC Check Blood glucose 10x day    loratadine (CLARITIN) 10 MG tablet Take 10 mg by mouth daily.    Multiple Vitamin (MULTIVITAMIN) tablet Take 1 tablet by mouth daily.      Norethin-Eth Estrad-Fe Biphas (LO LOESTRIN FE PO) Take by mouth.    butalbital-acetaminophen-caffeine (FIORICET) 50-325-40 MG tablet Take 1 tablet by mouth every 6 (six) hours as needed for headache. 1 tab q6h prn.  Call [phone] to schedule appt. (Patient not taking: Reported on 04/29/2020) 05/07/2020: Uses prn, if Excedrin Migraine not effective; last used 2 months ago   Glucagon (BAQSIMI ONE PACK) 3 MG/DOSE POWD Place 1 Dose into the nose as needed. (Patient not taking: Reported on 04/29/2020)    HUMALOG 100 UNIT/ML injection USE 300 UNITS IN INSULIN PUMP EVERY 48  HOURS. (Patient not taking: Reported on 05/07/2020) 11/30/2018: Keeps in case of pump failure   Insulin Glargine (BASAGLAR KWIKPEN) 100 UNIT/ML Inject up to 50 units per day if insulin pump fails. (Patient not taking: Reported on 04/29/2020)    insulin lispro (HUMALOG KWIKPEN) 100 UNIT/ML KwikPen USE AS DIRECTED IF PUMP FAILS (Patient not taking: Reported on 04/29/2020)    ondansetron (ZOFRAN-ODT) 4 MG disintegrating tablet TAKE 1 TABLET BY MOUTH EVERY 8 HOURS AS NEEDED FOR NAUSEA AND VOMITING (Patient not taking: Reported on 01/25/2020)    [DISCONTINUED] Continuous Blood Gluc Sensor ([place]) MISC Use a sensor every 10 days [place] [phone] (Patient not taking: Reported on 10/26/2019) 05/01/2018: Had problems with skin sensitivity   No facility-administered encounter medications on file as of 05/07/2020.   No Known Allergies  ROS: The patient denies anorexia, fever, vision changes, decreased hearing, ear pain, sore throat, breast concerns, chest pain, palpitations, dizziness, syncope, dyspnea on exertion, cough, swelling, nausea, vomiting, diarrhea, constipation, abdominal pain, melena, hematochezia, indigestion/heartburn, hematuria, incontinence, dysuria, irregular menstrual cycles, vaginal discharge, odor or itch, genital lesions, joint pains, numbness, tingling, weakness, tremor, suspicious skin lesions, abnormal bleeding/bruising, or enlarged lymph nodes.Denies depresssion, insomnia Congestion/sinuses fairly chronically.No purulent drainage. Somewhat worse in the last few months, more frequent headaches, and has triggered a few migraines. Headaches improved s/p  daith piercing and lexapro, Polydipsia and polyuria with high blood sugars, rare now.   PHYSICAL EXAM:  BP 120/70    Pulse 76    Temp 97.7 F (36.5 C) (Temporal)    Ht 5' 4"  (1.626 m)    Wt 137 lb 6.4 oz (62.3 kg)    LMP 03/28/2020 (Approximate)    BMI 23.58 kg/m   Wt Readings from Last 3 Encounters:  05/07/20 137  lb 6.4 oz (62.3 kg)  04/29/20 135 lb 9.6 oz (61.5 kg)  01/25/20 134 lb 6.4 oz (61 kg)   Wt 130# 12.8 oz at her CPE 04/2019  General Appearance:  Alert, cooperative, no distress, appears stated age  Head:  Normocephalic, without obvious abnormality, atraumatic  Eyes:  PERRL, conjunctiva/corneas clear, EOM's intact, fundi benign  Ears:  Normal TM's and external ear canals  Nose: Nasal mucosa is moderately edematous, L>R, slightly pink, no purulence noted. Sinuses are nontender  Throat: OP is clear, no lesions or erythema  Neck: Supple, no lymphadenopathy; thyroid: no enlargement/ tenderness/nodules; no carotid bruit or JVD  Back:  Spine nontender, no curvature, ROM normal, no CVA tenderness  Lungs:  Clear to auscultation bilaterally without wheezes, rales or ronchi; respirations unlabored  Chest Wall:  No tenderness or deformity  Heart:  Regular rate and rhythm, S1 and S2 normal, no murmur, rub or gallop  Breast Exam: Deferred to GYN   Abdomen:  Soft, non-tender, nondistended, normoactive bowel sounds,  no masses, no hepatosplenomegaly  Genitalia: deferred to GYN  Rectal:  Not performed  Extremities: No clubbing, cyanosis or edema. Normal monofilament exam  Pulses: 2+ and symmetric all extremities  Skin: Skin color, texture, turgor normal, no rashes. Normal diabetic foot exam, normal monofilament sensation.  Lymph nodes: Cervical, supraclavicular, andinguinalnodes normal  Neurologic: Normal strength, sensation and gait; reflexes 2+ and symmetric throughout      Psych: Normal mood, affect,eye contact, speech, hygiene and grooming.   ASSESSMENT/PLAN:  Annual physical exam - Plan: Comprehensive metabolic panel, CBC with Differential/Platelet, TSH  Controlled insulin dependent type 1 diabetes mellitus ([place]) - Discussed normal treatments for diabetics, including stating, ARB/ACEI, pneumovax. Consider in future. Getting  married, child-bearing age, nl BP/LDL - Plan: Comprehensive metabolic panel, TSH, Microalbumin / creatinine urine ratio  Migraine without aura and without status migrainosus, not intractable - less frequent than in past.  Avoid triggers. Sinuses contributing now also. Butalbital sparingly.  Vitamin D deficiency - continue current supplements  Seasonal allergic rhinitis, unspecified trigger - continue anthistamine, daily nasal steroid (proper technique reviewed); discussed sudafed prn, sinus rinses, mucinex. ABX if purulence develops  Medication monitoring encounter - Plan: Comprehensive metabolic panel   Consider pneumovax; given Prevnar-13 last year since she didn't have childhood series. Could get pneumovax now as well, due to diabetes. She declines vaccine today.  Will consider in future. Also discussed potential for statin and ARB/ACEI in future (if abnormal microalbumin, higher BP's for the ARB/ACEI), and if using good contraception. Checking microalbumin today. BP normal, and last LDL very low.  Can hold off for now. She is young, and getting married.  c-met, CBC, urine microalb, TSH  Discussed monthly self breast exams,at least 30 minutes of aerobic activity at least 5 days/week, weight bearing exercise at least 2x/week; proper sunscreen use reviewed; healthy diet, including goals of calcium and vitamin D intake reviewed; regular seatbelt use. Immunization recommendations discussed--continue yearly flu shots; refusesGardisil series. Consider Pneumovax (declines today).  Patient is moving to [place], and will need to establish with  new endocrinologist and PCP there, so no f/u is being scheduled.

## 2020-05-06 NOTE — Patient Instructions (Addendum)
  HEALTH MAINTENANCE RECOMMENDATIONS:  It is recommended that you get at least 30 minutes of aerobic exercise at least 5 days/week (for weight loss, you may need as much as 60-90 minutes). This can be any activity that gets your heart rate up. This can be divided in 10-15 minute intervals if needed, but try and build up your endurance at least once a week.  Weight bearing exercise is also recommended twice weekly.  Eat a healthy diet with lots of vegetables, fruits and fiber.  "Colorful" foods have a lot of vitamins (ie green vegetables, tomatoes, red peppers, etc).  Limit sweet tea, regular sodas and alcoholic beverages, all of which has a lot of calories and sugar.  Up to 1 alcoholic drink daily may be beneficial for women (unless trying to lose weight, watch sugars).  Drink a lot of water.  Calcium recommendations are 1200-1500 mg daily (1500 mg for postmenopausal women or women without ovaries), and vitamin D 1000 IU daily.  This should be obtained from diet and/or supplements (vitamins), and calcium should not be taken all at once, but in divided doses.  Monthly self breast exams and yearly mammograms for women over the age of 88 is recommended.  Sunscreen of at least SPF 30 should be used on all sun-exposed parts of the skin when outside between the hours of 10 am and 4 pm (not just when at beach or pool, but even with exercise, golf, tennis, and yard work!)  Use a sunscreen that says "broad spectrum" so it covers both UVA and UVB rays, and make sure to reapply every 1-2 hours.  Remember to change the batteries in your smoke detectors when changing your clock times in the spring and fall. Carbon monoxide detectors are recommended for your home.  Use your seat belt every time you are in a car, and please drive safely and not be distracted with cell phones and texting while driving.  Consider getting the other pneumonia vaccine in the future (when establishing with new  physician)--Pneumovax.  We discussed using sudafed if needed for sinus pain, versus doing sinus rinses (neti-pot or sinus rinse kit).

## 2020-05-07 ENCOUNTER — Encounter: Payer: Self-pay | Admitting: Family Medicine

## 2020-05-07 ENCOUNTER — Other Ambulatory Visit: Payer: Self-pay

## 2020-05-07 ENCOUNTER — Encounter: Payer: Self-pay | Admitting: *Deleted

## 2020-05-07 ENCOUNTER — Ambulatory Visit: Payer: BC Managed Care – PPO | Admitting: Family Medicine

## 2020-05-07 VITALS — BP 120/70 | HR 76 | Temp 97.7°F | Ht 64.0 in | Wt 137.4 lb

## 2020-05-07 DIAGNOSIS — E559 Vitamin D deficiency, unspecified: Secondary | ICD-10-CM

## 2020-05-07 DIAGNOSIS — G43009 Migraine without aura, not intractable, without status migrainosus: Secondary | ICD-10-CM

## 2020-05-07 DIAGNOSIS — Z5181 Encounter for therapeutic drug level monitoring: Secondary | ICD-10-CM | POA: Diagnosis not present

## 2020-05-07 DIAGNOSIS — E109 Type 1 diabetes mellitus without complications: Secondary | ICD-10-CM | POA: Diagnosis not present

## 2020-05-07 DIAGNOSIS — Z0000 Encounter for general adult medical examination without abnormal findings: Secondary | ICD-10-CM

## 2020-05-07 DIAGNOSIS — J302 Other seasonal allergic rhinitis: Secondary | ICD-10-CM

## 2020-05-07 MED ORDER — BUTALBITAL-APAP-CAFFEINE 50-325-40 MG PO TABS: 1.0000 | ORAL_TABLET | Freq: Four times a day (QID) | ORAL | 0 refills | Status: DC | PRN

## 2020-05-08 LAB — COMPREHENSIVE METABOLIC PANEL
ALT: 21 IU/L (ref 0–32)
AST: 15 IU/L (ref 0–40)
Albumin/Globulin Ratio: 1.7 (ref 1.2–2.2)
Albumin: 4.3 g/dL (ref 3.9–5.0)
Alkaline Phosphatase: 63 IU/L (ref 48–121)
BUN/Creatinine Ratio: 13 (ref 9–23)
BUN: 9 mg/dL (ref 6–20)
Bilirubin Total: 0.3 mg/dL (ref 0.0–1.2)
CO2: 23 mmol/L (ref 20–29)
Calcium: 9.1 mg/dL (ref 8.7–10.2)
Chloride: 103 mmol/L (ref 96–106)
Creatinine, Ser: 0.71 mg/dL (ref 0.57–1.00)
GFR calc Af Amer: 139 mL/min/{1.73_m2} (ref >59)
GFR calc non Af Amer: 120 mL/min/{1.73_m2} (ref >59)
Globulin, Total: 2.6 g/dL (ref 1.5–4.5)
Glucose: 112 mg/dL — ABNORMAL HIGH (ref 65–99)
Potassium: 4.5 mmol/L (ref 3.5–5.2)
Sodium: 140 mmol/L (ref 134–144)
Total Protein: 6.9 g/dL (ref 6.0–8.5)

## 2020-05-08 LAB — CBC WITH DIFFERENTIAL/PLATELET
Basophils Absolute: 0.1 10*3/uL (ref 0.0–0.2)
Basos: 1 %
EOS (ABSOLUTE): 0.3 10*3/uL (ref 0.0–0.4)
Eos: 4 %
Hematocrit: 42.6 % (ref 34.0–46.6)
Hemoglobin: 13.8 g/dL (ref 11.1–15.9)
Immature Grans (Abs): 0.1 10*3/uL (ref 0.0–0.1)
Immature Granulocytes: 1 %
Lymphocytes Absolute: 2.8 10*3/uL (ref 0.7–3.1)
Lymphs: 33 %
MCH: 29.6 pg (ref 26.6–33.0)
MCHC: 32.4 g/dL (ref 31.5–35.7)
MCV: 91 fL (ref 79–97)
Monocytes Absolute: 0.6 10*3/uL (ref 0.1–0.9)
Monocytes: 7 %
Neutrophils Absolute: 4.6 10*3/uL (ref 1.4–7.0)
Neutrophils: 54 %
Platelets: 315 10*3/uL (ref 150–450)
RBC: 4.66 x10E6/uL (ref 3.77–5.28)
RDW: 11.9 % (ref 11.7–15.4)
WBC: 8.4 10*3/uL (ref 3.4–10.8)

## 2020-05-08 LAB — MICROALBUMIN / CREATININE URINE RATIO
Creatinine, Urine: 156.6 mg/dL
Microalb/Creat Ratio: 4 mg/g creat (ref 0–29)
Microalbumin, Urine: 5.6 ug/mL

## 2020-05-08 LAB — TSH: TSH: 2.17 u[IU]/mL (ref 0.450–4.500)

## 2020-05-19 ENCOUNTER — Encounter: Payer: Self-pay | Admitting: Family Medicine

## 2020-05-21 DIAGNOSIS — Z01419 Encounter for gynecological examination (general) (routine) without abnormal findings: Secondary | ICD-10-CM | POA: Diagnosis not present

## 2020-05-21 DIAGNOSIS — Z6823 Body mass index (BMI) 23.0-23.9, adult: Secondary | ICD-10-CM | POA: Diagnosis not present

## 2020-07-19 ENCOUNTER — Other Ambulatory Visit: Payer: Self-pay | Admitting: Family Medicine

## 2020-07-21 NOTE — Telephone Encounter (Signed)
Is this okay to refill? 

## 2020-07-29 ENCOUNTER — Encounter (INDEPENDENT_AMBULATORY_CARE_PROVIDER_SITE_OTHER): Payer: Self-pay | Admitting: Family

## 2020-07-29 ENCOUNTER — Other Ambulatory Visit: Payer: Self-pay

## 2020-07-29 ENCOUNTER — Ambulatory Visit (INDEPENDENT_AMBULATORY_CARE_PROVIDER_SITE_OTHER): Payer: BC Managed Care – PPO | Admitting: Family

## 2020-07-29 VITALS — BP 118/72 | HR 80 | Wt 139.0 lb

## 2020-07-29 DIAGNOSIS — E10649 Type 1 diabetes mellitus with hypoglycemia without coma: Secondary | ICD-10-CM

## 2020-07-29 DIAGNOSIS — E1065 Type 1 diabetes mellitus with hyperglycemia: Secondary | ICD-10-CM | POA: Diagnosis not present

## 2020-07-29 DIAGNOSIS — R739 Hyperglycemia, unspecified: Secondary | ICD-10-CM

## 2020-07-29 DIAGNOSIS — Z4681 Encounter for fitting and adjustment of insulin pump: Secondary | ICD-10-CM

## 2020-07-29 DIAGNOSIS — F4320 Adjustment disorder, unspecified: Secondary | ICD-10-CM

## 2020-07-29 LAB — POCT GLUCOSE (DEVICE FOR HOME USE): POC Glucose: 276 mg/dl — AB (ref 70–99)

## 2020-07-29 LAB — POCT GLYCOSYLATED HEMOGLOBIN (HGB A1C): Hemoglobin A1C: 7 % — AB (ref 4.0–5.6)

## 2020-07-29 MED ORDER — NOVOLOG FLEXPEN 100 UNIT/ML ~~LOC~~ SOPN: PEN_INJECTOR | SUBCUTANEOUS | 1 refills | Status: AC

## 2020-07-29 MED ORDER — INSULIN GLARGINE 100 UNIT/ML ~~LOC~~ SOLN: SUBCUTANEOUS | 1 refills | Status: AC

## 2020-07-29 NOTE — Progress Notes (Signed)
Pediatric Endocrinology Diabetes Consultation Follow-up Visit  Paula Miller 1996-06-15 [id number]  Chief Complaint: Follow-up type 1 diabetes   Paula Arrow, MD   HPI: Paula Miller  is a 24 y.o. female presenting for follow-up of type 1 diabetes.  1. Paula Miller was diagnosed with new onset type 1 diabetes mellitus, dehydration, and ketonuria on 06/09/2005. She was initially treated with a multiple daily injection regimen of Lantus and Novolog insulins.  We converted her to a Medtronic insulin pump in November of 2007.  Although the patient does have a palpable goiter, she has remained euthyroid. Her CMPs, lipid panels, and urinary microalbumin to creatinine ratios have always been normal.   2. Since last visit to [facility] on 04/2020 , she has been well.  No ER visits or hospitalizations.   She is very stressed but excited because she will be moving to [place] in October and is getting married in 19 days. She also got a job working for [employer], she will be working from home.   She is wearing Tslim insulin pump and Dexcom CGM. She reports that she rarely boluses but her pump seems to do well without her boluses. She feels like she is having more hypoglycemia, especially with activity.    Insulin regimen: Tandem Tslim  Basal Rates 12AM 0.65  630Am  1.20  10am 11am 1.67 1.65  9pm 1.20   11pm 1.15    Insulin to Carbohydrate Ratio 12AM  10  6am  10  11am 10  5pm 10       Insulin Sensitivity Factor 12AM  55   6am 45  10pm 55         Target Blood Glucose 12AM  150  6am 110  11pm 150          Hypoglycemia: Able to feel low blood sugars.  No glucagon needed recently.  Insulin Pump and CGM download:   Med-alert ID: Not currently wearing. Injection sites: bilateral hips  Annual labs due: 04/2020 Ophthalmology due: 2019 December. Discussed importance of dilated eye exam.     3. ROS: Greater than 10 systems reviewed with pertinent positives listed in HPI, otherwise  neg. Constitutional: Weight stable. Sleeping well.  Eyes: No changes in vision. Denies blurry vision. Wears glasses.  Ears/Nose/Mouth/Throat: No difficulty swallowing. No throat pain  Cardiovascular: No palpitations. No tachycardia. No chest pain Respiratory: No increased work of breathing.  Gastrointestinal: No constipation or diarrhea.  Genitourinary: No nocturia, no polyuria Neurologic: Normal sensation, no tremor Endocrine: No polydipsia.  No hyperpigmentation Psychiatric: Normal affect. Denies anxiety and depression.   Past Medical History:   Past Medical History:  Diagnosis Date  . Allergy    seasonal allergies  . Goiter   . Hypoglycemia associated with diabetes (HCC)   . Migraine   . Physical growth delay   . Type 1 diabetes mellitus in patient age 51-19 years with HbA1C goal below 7.5 diagnosed age 53  . VSD (ventricular septal defect)     Medications:  Outpatient Encounter Medications as of 07/29/2020  Medication Sig Note  . butalbital-acetaminophen-caffeine (FIORICET) 50-325-40 MG tablet TAKE 1-2 TABLETS BY MOUTH EVERY 6 (SIX) HOURS AS NEEDED FOR HEADACHE OR MIGRAINE.   Marland Kitchen insulin aspart (NOVOLOG) 100 UNIT/ML injection USE 300 UNITS IN INSULIN PUMP EVERY 48 HOURS.   . Calcium Carb-Cholecalciferol (CALCIUM-VITAMIN D) 500-200 MG-UNIT tablet Take 1 tablet by mouth daily. (Patient not taking: Reported on 07/29/2020) 04/28/2017: Unsure of dose. Target brand.  Takes one daily  . Continuous Blood Gluc Sensor (  DEXCOM G6 SENSOR) MISC by Does not apply route. (Patient not taking: Reported on 07/29/2020)   . escitalopram (LEXAPRO) 10 MG tablet Take 10 mg by mouth daily. (Patient not taking: Reported on 07/29/2020) 06/04/2019: Takes 15 mg daily (1 1/2 tablet)   . fluticasone (FLONASE) 50 MCG/ACT nasal spray Place 2 sprays into both nostrils daily. (Patient not taking: Reported on 07/29/2020)   . Glucagon (BAQSIMI ONE PACK) 3 MG/DOSE POWD Place 1 Dose into the nose as needed. (Patient not taking:  Reported on 04/29/2020)   . HUMALOG 100 UNIT/ML injection USE 300 UNITS IN INSULIN PUMP EVERY 48 HOURS. (Patient not taking: Reported on 05/07/2020) 11/30/2018: Keeps in case of pump failure  . insulin aspart (NOVOLOG FLEXPEN) 100 UNIT/ML FlexPen Inject up to 50 units per day   . insulin glargine (LANTUS) 100 UNIT/ML injection Inject up to 50 units per day   . insulin lispro (HUMALOG KWIKPEN) 100 UNIT/ML KwikPen USE AS DIRECTED IF PUMP FAILS (Patient not taking: Reported on 04/29/2020)   . Insulin Pen Needle 31G X 5 MM MISC Check Blood glucose 10x day (Patient not taking: Reported on 07/29/2020)   . loratadine (CLARITIN) 10 MG tablet Take 10 mg by mouth daily. (Patient not taking: Reported on 07/29/2020)   . Multiple Vitamin (MULTIVITAMIN) tablet Take 1 tablet by mouth daily.   (Patient not taking: Reported on 07/29/2020)   . Norethin-Eth Estrad-Fe Biphas (LO LOESTRIN FE PO) Take by mouth. (Patient not taking: Reported on 07/29/2020)   . ondansetron (ZOFRAN-ODT) 4 MG disintegrating tablet TAKE 1 TABLET BY MOUTH EVERY 8 HOURS AS NEEDED FOR NAUSEA AND VOMITING (Patient not taking: Reported on 01/25/2020)   . [DISCONTINUED] Insulin Glargine (BASAGLAR KWIKPEN) 100 UNIT/ML Inject up to 50 units per day if insulin pump fails. (Patient not taking: Reported on 04/29/2020)    No facility-administered encounter medications on file as of 07/29/2020.    Allergies: No Known Allergies  Surgical History: Past Surgical History:  Procedure Laterality Date  . FRENULECTOMY, LINGUAL    . periodontal surgery      Family History:  Family History  Problem Relation Age of Onset  . Allergies Mother   . Thyroid disease Mother   . Thyroid disease Maternal Grandmother   . Parkinson's disease Maternal Grandmother   . Arthritis Maternal Grandfather   . Arthritis Paternal Grandmother   . Heart attack Paternal Grandfather   . Anxiety disorder Sister   . Cancer Neg Hx       Social History: Lives with: Fiance  Moving to [place]    Physical Exam:  Vitals:   07/29/20 1506  BP: 118/72  Pulse: 80  Weight: 139 lb (63 kg)   BP 118/72   Pulse 80   Wt 139 lb (63 kg)   BMI 23.86 kg/m  Body mass index: body mass index is 23.86 kg/m. Growth percentile SmartLinks can only be used for patients less than 41 years old.  Ht Readings from Last 3 Encounters:  05/07/20 5\' 4"  (1.626 m)  05/02/19 5\' 4"  (1.626 m)  02/21/19 5\' 4"  (1.626 m)   Wt Readings from Last 3 Encounters:  07/29/20 139 lb (63 kg)  05/07/20 137 lb 6.4 oz (62.3 kg)  04/29/20 135 lb 9.6 oz (61.5 kg)   General: Well developed, well nourished female in no acute distress.  Head: Normocephalic, atraumatic.   Eyes:  Pupils equal and round. EOMI.   Sclera white.  No eye drainage.   Ears/Nose/Mouth/Throat: Nares patent, no nasal drainage.  Normal dentition, mucous membranes moist.   Neck: supple, no cervical lymphadenopathy, no thyromegaly Cardiovascular: regular rate, normal S1/S2, no murmurs Respiratory: No increased work of breathing.  Lungs clear to auscultation bilaterally.  No wheezes. Abdomen: soft, nontender, nondistended. Normal bowel sounds.  No appreciable masses  Extremities: warm, well perfused, cap refill < 2 sec.   Musculoskeletal: Normal muscle mass.  Normal strength Skin: warm, dry.  No rash or lesions. Neurologic: alert and oriented, normal speech, no tremor    Labs:  Results for orders placed or performed in visit on 07/29/20  POCT glycosylated hemoglobin (Hb A1C)  Result Value Ref Range   Hemoglobin A1C 7.0 (A) 4.0 - 5.6 %   HbA1c POC (<> result, manual entry)     HbA1c, POC (prediabetic range)     HbA1c, POC (controlled diabetic range)    POCT Glucose (Device for Home Use)  Result Value Ref Range   Glucose Fasting, POC     POC Glucose 276 (A) 70 - 99 mg/dl    Hemoglobin B1Y was 7.8% on 01/2020  Assessment/Plan: Sreenidhi is a 24 y.o. female with type 1 diabetes on Tslim insulin pump with contro lQ. She heavily relies on  basal insulin and auto bolus from pump. Having a pattern of hypoglycemia mid afternoon, will decrease basal rate and correction factor. Hemoglobin A1c is 7%. She will transition to adult endocrinology after todays visit.    1-3. DM w/o complication type I, uncontrolled (HCC)/Hyperglycemia/ Elevated A1c - Reviewed insulin pump and CGM download. Discussed trends and patterns.  - Rotate pump sites to prevent scar tissue.  - bolus 15 minutes prior to eating to limit blood sugar spikes.  - Reviewed carb counting and importance of accurate carb counting.  - Discussed signs and symptoms of hypoglycemia. Always have glucose available.  - POCT glucose and hemoglobin A1c  - Reviewed growth chart.  - Stressed the importance of bolusing for all food intake!   4. Insulin pump titration  Basal Rates 12AM 0.65  630Am  1.20  10am 11am 1.67 1.65--> 1.55  9pm 1.20   11pm 1.15   Insulin Sensitivity Factor 12AM  55   6am 45--> 55  10pm 55           5. Adjustment reaction  - Discussed transition to adult care    Follow-up:  3 months.    >45  spent today reviewing the medical chart, counseling the patient/family, and documenting today's visit.   When a patient is on insulin, intensive monitoring of blood glucose levels is necessary to avoid hyperglycemia and hypoglycemia. Severe hyperglycemia/hypoglycemia can lead to hospital admissions and be life threatening.   Gretchen Short,  FNP-C  Pediatric Specialist  [address]  [place], [address]  Tele: [phone]

## 2020-07-29 NOTE — Patient Instructions (Signed)

## 2020-08-06 DIAGNOSIS — E109 Type 1 diabetes mellitus without complications: Secondary | ICD-10-CM | POA: Diagnosis not present

## 2020-09-22 ENCOUNTER — Telehealth (INDEPENDENT_AMBULATORY_CARE_PROVIDER_SITE_OTHER): Payer: Self-pay | Admitting: Family

## 2020-09-22 DIAGNOSIS — E1065 Type 1 diabetes mellitus with hyperglycemia: Secondary | ICD-10-CM

## 2020-09-22 DIAGNOSIS — E109 Type 1 diabetes mellitus without complications: Secondary | ICD-10-CM | POA: Diagnosis not present

## 2020-09-22 NOTE — Telephone Encounter (Signed)
Who's calling (name and relationship to patient) : Paula Miller mom   Best contact number: [phone]  Provider they see: Gretchen Short   Reason for call: Mom would like a call back. Patient has moved to [place] and would like to discuss steps for transitioning.   Call ID:      PRESCRIPTION REFILL ONLY  Name of prescription:  Pharmacy:

## 2020-09-25 NOTE — Telephone Encounter (Signed)
Called mother and was unable to leave a voicemail.

## 2020-09-29 ENCOUNTER — Other Ambulatory Visit: Payer: Self-pay | Admitting: Neurology

## 2020-09-29 DIAGNOSIS — R112 Nausea with vomiting, unspecified: Secondary | ICD-10-CM

## 2020-10-01 NOTE — Telephone Encounter (Signed)
Mom returned call and would like a call back asap

## 2020-10-01 NOTE — Telephone Encounter (Signed)
Spoke with mom. Patient has been in [place] for 30 days. Patient just signed up for insurance and it will not start for 30 days. Mom is concerned that the patient will run out of Dexcom supplies and Tandem infusion sets. I let mom know that we have samples here that she could come pick up for the patient. She is going to email me at [email or web address] with the tandem infusion details.

## 2020-10-01 NOTE — Telephone Encounter (Signed)
Called. Left message with call back number.  

## 2020-10-02 ENCOUNTER — Telehealth (INDEPENDENT_AMBULATORY_CARE_PROVIDER_SITE_OTHER): Payer: Self-pay | Admitting: Family

## 2020-10-02 ENCOUNTER — Other Ambulatory Visit (INDEPENDENT_AMBULATORY_CARE_PROVIDER_SITE_OTHER): Payer: Self-pay

## 2020-10-02 NOTE — Addendum Note (Signed)
Addended by: Osa Craver on: 10/02/2020 02:23 PM   Modules accepted: Orders

## 2020-10-02 NOTE — Telephone Encounter (Signed)
  Who's calling (name and relationship to patient) : Misty Stanley (mom)  Best contact number:[phone])  Provider they see: Gretchen Short  Reason for call: Misty Stanley ( mom) called and LVM that she would like a return call she did not state in the message why I do see a DPR on file from 2015 to speak to mom     PRESCRIPTION REFILL ONLY  Name of prescription:  Pharmacy:

## 2020-10-02 NOTE — Telephone Encounter (Signed)
Mom would like a call today after 12. She has appt this morning. She would like to discuss a couple of things with Tiffany.

## 2020-10-02 NOTE — Telephone Encounter (Signed)
Spoke with mom. She asked that we send a referral to Dr Era Skeen. Fax number [phone]. Let mom know that once insurance changes to let us know and we wil send her [employer] supplies to the new carrier along with insulin and Dexcom supplies.

## 2020-10-02 NOTE — Telephone Encounter (Signed)
Paula Miller,  I left you a message today to ask additional questions about transitioning Paula Miller to a doctor in [place]. Since she doesn't have insurance yet, it is difficult to find an in network provider with the new insurance. She is going to try researching that today and hopefully can find someone. I did call one office near her and they have no appointments until March for a new patient, so she is looking at a several month stretch without supplies.   She has her current insurance through Nov 30. I am wondering if Paula Miller could squeeze in a video appointment with her before that insurance ends and then prescribe for her pump/sensor needs until she can get a new doctor lined up. Is that possible? Even with the extra supplies you can provide, she will run out before she can get in to see a new doctor.

## 2020-10-23 ENCOUNTER — Telehealth (INDEPENDENT_AMBULATORY_CARE_PROVIDER_SITE_OTHER): Payer: Self-pay | Admitting: Family

## 2020-10-23 NOTE — Telephone Encounter (Signed)
Mom Paula Miller) called back regarding [employer] supplies and requests a call back from Tiffany at [phone].

## 2020-10-23 NOTE — Telephone Encounter (Signed)
  Who's calling (name and relationship to patient) : Misty Stanley (mom)  Best contact number: [phone]  Provider they see: Gretchen Short  Reason for call: Mom states that patient is [place] to establish with adult endo [place] and needs referral sent to [facility]. Their fax number is [phone].    PRESCRIPTION REFILL ONLY  Name of prescription:  Pharmacy:

## 2020-10-24 ENCOUNTER — Encounter (INDEPENDENT_AMBULATORY_CARE_PROVIDER_SITE_OTHER): Payer: Self-pay

## 2020-10-24 NOTE — Telephone Encounter (Signed)
Spoke with mom and Jermeka through [employer]

## 2020-10-27 ENCOUNTER — Other Ambulatory Visit (INDEPENDENT_AMBULATORY_CARE_PROVIDER_SITE_OTHER): Payer: Self-pay

## 2020-10-27 MED ORDER — DEXCOM G6 SENSOR MISC: 1 refills | Status: DC

## 2020-10-27 MED ORDER — DEXCOM G6 TRANSMITTER MISC: 1 refills | Status: DC

## 2020-10-27 NOTE — Telephone Encounter (Signed)
PLEASE CALL MOM REGARDING REFERRAL. MOM'S NAME IS [name] Mohrmann AND CALL BACK NUMBER IS [phone]. SHE ALSO HAS QUESTIONS REGARDING DEXCOM.

## 2020-10-27 NOTE — Telephone Encounter (Signed)
Spoke with mom and let her know the referral was faxed out in November, but we got it back indicating the insurance information was not correct. Janika gave the correct insurance information and the information was faxed out. Before contacting Misty Stanley I contacted [facility] and they informed they did not receive the fax. Confirmed the fax number [phone] and refaxed it. Will follow up tomorrow to ensure they received it.   Mom asked that the rx for dexcom be sent as a 90 day supply to the [facility]. There is an account number that was given in the MyChart message that needs to be included in the prescription so they can attach it to the correct account for [facility]. This was sent.

## 2020-10-30 ENCOUNTER — Telehealth (INDEPENDENT_AMBULATORY_CARE_PROVIDER_SITE_OTHER): Payer: Self-pay | Admitting: Family

## 2020-10-30 NOTE — Telephone Encounter (Signed)
Faxed

## 2020-10-30 NOTE — Telephone Encounter (Signed)
Please send notes, labs, and Spenser's NPI.  This Info was missing from the Endo referral.

## 2021-01-03 ENCOUNTER — Other Ambulatory Visit (INDEPENDENT_AMBULATORY_CARE_PROVIDER_SITE_OTHER): Payer: Self-pay | Admitting: Family

## 2021-01-03 DIAGNOSIS — E1065 Type 1 diabetes mellitus with hyperglycemia: Secondary | ICD-10-CM

## 2021-01-05 NOTE — Telephone Encounter (Signed)
Called patient, mom answered, mom is on DPR, patient is working on establishing else where but the first available is not until 03/25/2021.   Will send in 3 month script.  Mom also mentioned that patient will need pump supplies too before then.  I told her we should be able to take care of it since her appointment is not until 5/4.

## 2021-03-21 ENCOUNTER — Other Ambulatory Visit (INDEPENDENT_AMBULATORY_CARE_PROVIDER_SITE_OTHER): Payer: Self-pay | Admitting: Family

## 2021-03-23 ENCOUNTER — Encounter (INDEPENDENT_AMBULATORY_CARE_PROVIDER_SITE_OTHER): Payer: Self-pay | Admitting: *Deleted

## 2024-02-28 ENCOUNTER — Encounter (INDEPENDENT_AMBULATORY_CARE_PROVIDER_SITE_OTHER): Payer: Self-pay

## 2024-03-12 ENCOUNTER — Encounter (INDEPENDENT_AMBULATORY_CARE_PROVIDER_SITE_OTHER): Payer: Self-pay
